# Patient Record
Sex: Female | Born: 1945 | Race: White | Hispanic: No | Smoking: Never smoker
Health system: Southern US, Community
[De-identification: ages and names within clinical notes are randomized; demographics above are authoritative.]

## PROBLEM LIST (undated history)

## (undated) DIAGNOSIS — Z860100 Personal history of colon polyps, unspecified: Secondary | ICD-10-CM

## (undated) DIAGNOSIS — F419 Anxiety disorder, unspecified: Secondary | ICD-10-CM

## (undated) DIAGNOSIS — F329 Major depressive disorder, single episode, unspecified: Secondary | ICD-10-CM

## (undated) DIAGNOSIS — D219 Benign neoplasm of connective and other soft tissue, unspecified: Secondary | ICD-10-CM

## (undated) DIAGNOSIS — K219 Gastro-esophageal reflux disease without esophagitis: Secondary | ICD-10-CM

## (undated) DIAGNOSIS — N390 Urinary tract infection, site not specified: Secondary | ICD-10-CM

## (undated) DIAGNOSIS — L989 Disorder of the skin and subcutaneous tissue, unspecified: Secondary | ICD-10-CM

## (undated) DIAGNOSIS — N6320 Unspecified lump in the left breast, unspecified quadrant: Secondary | ICD-10-CM

## (undated) DIAGNOSIS — C801 Malignant (primary) neoplasm, unspecified: Secondary | ICD-10-CM

## (undated) DIAGNOSIS — F32A Depression, unspecified: Secondary | ICD-10-CM

## (undated) DIAGNOSIS — M8580 Other specified disorders of bone density and structure, unspecified site: Secondary | ICD-10-CM

## (undated) DIAGNOSIS — F319 Bipolar disorder, unspecified: Secondary | ICD-10-CM

## (undated) DIAGNOSIS — E079 Disorder of thyroid, unspecified: Secondary | ICD-10-CM

## (undated) DIAGNOSIS — R410 Disorientation, unspecified: Secondary | ICD-10-CM

## (undated) DIAGNOSIS — G2581 Restless legs syndrome: Secondary | ICD-10-CM

## (undated) DIAGNOSIS — E039 Hypothyroidism, unspecified: Secondary | ICD-10-CM

## (undated) HISTORY — PX: THYROID SURGERY: SHX805

## (undated) HISTORY — DX: Personal history of colonic polyps: Z86.010

## (undated) HISTORY — DX: Malignant (primary) neoplasm, unspecified: C80.1

## (undated) HISTORY — PX: SKIN CANCER EXCISION: SHX779

## (undated) HISTORY — PX: DILATION AND CURETTAGE OF UTERUS: SHX78

## (undated) HISTORY — PX: BREAST SURGERY: SHX581

## (undated) HISTORY — PX: LIPOSUCTION TRUNK: SUR833

## (undated) HISTORY — DX: Disorder of the skin and subcutaneous tissue, unspecified: L98.9

## (undated) HISTORY — DX: Benign neoplasm of connective and other soft tissue, unspecified: D21.9

## (undated) HISTORY — DX: Other specified disorders of bone density and structure, unspecified site: M85.80

## (undated) HISTORY — PX: TONSILLECTOMY: SUR1361

## (undated) HISTORY — DX: Personal history of colon polyps, unspecified: Z86.0100

---

## 1998-09-03 ENCOUNTER — Ambulatory Visit (HOSPITAL_COMMUNITY): Admission: RE | Admit: 1998-09-03 | Discharge: 1998-09-03 | Payer: Self-pay | Admitting: Gastroenterology

## 1999-05-13 ENCOUNTER — Other Ambulatory Visit: Admission: RE | Admit: 1999-05-13 | Discharge: 1999-05-13 | Payer: Self-pay | Admitting: Obstetrics and Gynecology

## 2000-05-11 ENCOUNTER — Other Ambulatory Visit: Admission: RE | Admit: 2000-05-11 | Discharge: 2000-05-11 | Payer: Self-pay | Admitting: Obstetrics and Gynecology

## 2001-05-31 ENCOUNTER — Other Ambulatory Visit: Admission: RE | Admit: 2001-05-31 | Discharge: 2001-05-31 | Payer: Self-pay | Admitting: Obstetrics and Gynecology

## 2002-06-19 ENCOUNTER — Other Ambulatory Visit: Admission: RE | Admit: 2002-06-19 | Discharge: 2002-06-19 | Payer: Self-pay | Admitting: Obstetrics and Gynecology

## 2003-06-24 ENCOUNTER — Other Ambulatory Visit: Admission: RE | Admit: 2003-06-24 | Discharge: 2003-06-24 | Payer: Self-pay | Admitting: Obstetrics and Gynecology

## 2003-09-26 ENCOUNTER — Ambulatory Visit (HOSPITAL_COMMUNITY): Admission: RE | Admit: 2003-09-26 | Discharge: 2003-09-26 | Payer: Self-pay | Admitting: Gastroenterology

## 2003-09-26 ENCOUNTER — Encounter (INDEPENDENT_AMBULATORY_CARE_PROVIDER_SITE_OTHER): Payer: Self-pay | Admitting: Specialist

## 2004-01-11 HISTORY — PX: HYSTEROSCOPY WITH D & C: SHX1775

## 2004-06-18 ENCOUNTER — Ambulatory Visit (HOSPITAL_COMMUNITY): Admission: RE | Admit: 2004-06-18 | Discharge: 2004-06-18 | Payer: Self-pay | Admitting: Obstetrics and Gynecology

## 2004-06-18 ENCOUNTER — Encounter (INDEPENDENT_AMBULATORY_CARE_PROVIDER_SITE_OTHER): Payer: Self-pay | Admitting: Specialist

## 2004-10-05 ENCOUNTER — Other Ambulatory Visit: Admission: RE | Admit: 2004-10-05 | Discharge: 2004-10-05 | Payer: Self-pay | Admitting: Obstetrics and Gynecology

## 2005-10-05 ENCOUNTER — Other Ambulatory Visit: Admission: RE | Admit: 2005-10-05 | Discharge: 2005-10-05 | Payer: Self-pay | Admitting: Obstetrics and Gynecology

## 2006-10-13 ENCOUNTER — Other Ambulatory Visit: Admission: RE | Admit: 2006-10-13 | Discharge: 2006-10-13 | Payer: Self-pay | Admitting: Obstetrics and Gynecology

## 2007-10-17 ENCOUNTER — Other Ambulatory Visit: Admission: RE | Admit: 2007-10-17 | Discharge: 2007-10-17 | Payer: Self-pay | Admitting: Obstetrics and Gynecology

## 2009-03-24 ENCOUNTER — Emergency Department (HOSPITAL_COMMUNITY): Admission: EM | Admit: 2009-03-24 | Discharge: 2009-03-25 | Payer: Self-pay | Admitting: Emergency Medicine

## 2009-07-17 ENCOUNTER — Other Ambulatory Visit: Admission: RE | Admit: 2009-07-17 | Discharge: 2009-07-17 | Payer: Self-pay | Admitting: Radiology

## 2009-11-02 ENCOUNTER — Ambulatory Visit (HOSPITAL_COMMUNITY): Admission: RE | Admit: 2009-11-02 | Discharge: 2009-11-02 | Payer: Self-pay | Admitting: Internal Medicine

## 2009-11-10 ENCOUNTER — Ambulatory Visit (HOSPITAL_COMMUNITY): Admission: RE | Admit: 2009-11-10 | Discharge: 2009-11-10 | Payer: Self-pay | Admitting: Surgery

## 2009-12-28 ENCOUNTER — Encounter (INDEPENDENT_AMBULATORY_CARE_PROVIDER_SITE_OTHER): Payer: Self-pay | Admitting: Surgery

## 2009-12-28 ENCOUNTER — Ambulatory Visit (HOSPITAL_COMMUNITY)
Admission: RE | Admit: 2009-12-28 | Discharge: 2009-12-29 | Payer: Self-pay | Source: Home / Self Care | Attending: Surgery | Admitting: Surgery

## 2010-01-30 ENCOUNTER — Encounter: Payer: Self-pay | Admitting: Internal Medicine

## 2010-02-19 ENCOUNTER — Other Ambulatory Visit (HOSPITAL_COMMUNITY): Payer: Self-pay | Admitting: Surgery

## 2010-02-19 DIAGNOSIS — E214 Other specified disorders of parathyroid gland: Secondary | ICD-10-CM

## 2010-03-05 ENCOUNTER — Encounter (HOSPITAL_COMMUNITY): Admission: RE | Admit: 2010-03-05 | Payer: BC Managed Care – PPO | Source: Ambulatory Visit

## 2010-03-05 ENCOUNTER — Ambulatory Visit (HOSPITAL_COMMUNITY)
Admission: RE | Admit: 2010-03-05 | Discharge: 2010-03-05 | Disposition: A | Discharge status: Home / Self Care | Payer: BC Managed Care – PPO | Source: Ambulatory Visit | Attending: Surgery | Admitting: Surgery

## 2010-03-05 ENCOUNTER — Ambulatory Visit (HOSPITAL_COMMUNITY): Payer: Self-pay

## 2010-03-05 ENCOUNTER — Encounter (HOSPITAL_COMMUNITY): Payer: Self-pay

## 2010-03-05 DIAGNOSIS — E214 Other specified disorders of parathyroid gland: Secondary | ICD-10-CM

## 2010-03-05 DIAGNOSIS — E213 Hyperparathyroidism, unspecified: Secondary | ICD-10-CM | POA: Insufficient documentation

## 2010-03-05 MED ORDER — TECHNETIUM TC 99M SESTAMIBI - CARDIOLITE
25.0000 | Freq: Once | INTRAVENOUS | Status: AC | PRN
  Administered 2010-03-05: 25 via INTRAVENOUS

## 2010-03-08 ENCOUNTER — Encounter (HOSPITAL_COMMUNITY): Payer: Self-pay

## 2010-03-22 LAB — CALCIUM: Calcium: 10.2 mg/dL (ref 8.4–10.5)

## 2010-03-23 LAB — URINE MICROSCOPIC-ADD ON

## 2010-03-23 LAB — DIFFERENTIAL
Lymphocytes Relative: 18 % (ref 12–46)
Lymphs Abs: 1.4 10*3/uL (ref 0.7–4.0)
Monocytes Absolute: 0.6 10*3/uL (ref 0.1–1.0)
Monocytes Relative: 8 % (ref 3–12)
Neutro Abs: 5.6 10*3/uL (ref 1.7–7.7)
Neutrophils Relative %: 72 % (ref 43–77)

## 2010-03-23 LAB — CBC
HCT: 43.4 % (ref 36.0–46.0)
Hemoglobin: 13.8 g/dL (ref 12.0–15.0)
MCHC: 31.8 g/dL (ref 30.0–36.0)
RBC: 4.48 MIL/uL (ref 3.87–5.11)

## 2010-03-23 LAB — BASIC METABOLIC PANEL
Calcium: 10.7 mg/dL — ABNORMAL HIGH (ref 8.4–10.5)
GFR calc Af Amer: >60 mL/min (ref >60)
GFR calc non Af Amer: >60 mL/min (ref >60)
Glucose, Bld: 83 mg/dL (ref 70–99)
Potassium: 4.2 mEq/L (ref 3.5–5.1)
Sodium: 141 mEq/L (ref 135–145)

## 2010-03-23 LAB — URINALYSIS, ROUTINE W REFLEX MICROSCOPIC
Bilirubin Urine: NEGATIVE
Glucose, UA: NEGATIVE mg/dL
Hgb urine dipstick: NEGATIVE
Protein, ur: NEGATIVE mg/dL
Urobilinogen, UA: 0.2 mg/dL (ref 0.0–1.0)

## 2010-03-23 LAB — PROTIME-INR: INR: 0.93 (ref 0.00–1.49)

## 2010-03-23 LAB — SURGICAL PCR SCREEN: Staphylococcus aureus: POSITIVE — AB

## 2010-05-28 NOTE — Op Note (Signed)
NAME:  Amy [name], Amy [name]            ACCOUNT NO.:  [id number]   MEDICAL RECORD NO.:  [id number]          PATIENT TYPE:  AMB   LOCATION:  SDC                           FACILITY:  [facility]   PHYSICIAN:  Cynthia P. Romine, M.D.DATE OF BIRTH:  25-Jan-1945   DATE OF PROCEDURE:  06/18/2004  DATE OF DISCHARGE:                                 OPERATIVE REPORT   PREOPERATIVE DIAGNOSES:  1.  Postmenopausal bleeding.  2.  Known endometrial polyp.   POSTOPERATIVE DIAGNOSES:  1.  Postmenopausal bleeding.  2.  Known endometrial polyp.  3.  Pathology pending.   PROCEDURE:  Hysteroscopic resection of endometrial polyp, dilatation and  curettage.   SURGEON:  Cynthia P. Romine, M.D.   ANESTHESIA:  General by LMA.   ESTIMATED BLOOD LOSS:  Minimal.   COMPLICATIONS:  None.   SORBITOL DEFICIT:  Zero.   PROCEDURE:  The patient was taken to the operating room and after the  induction of adequate general anesthesia by LMA, was placed in the dorsal  lithotomy position and prepped and draped in the usual fashion.  The bladder  was drained with a red rubber catheter.  A posterior weighted and anterior  Sims retractor were placed.  The cervix was grasped on its anterior lip with  a single-tooth tenaculum.  The cervix was dilated to a #31 Pratt.  The  operative hysteroscope was introduced and the endometrial cavity explored.  Photographic documentation was taken of the 1.8 cm fundal endometrial polyp  that had been seen previously on sonohysterogram.  The endometrial polyp was  resected using a single loop.  The sorbitol pump was set at a pressure of 80  mmHg.  The polyp was large enough that it needed to be resected in several  pieces.  After removing the polyp with the resectoscope, the resectoscope  was removed, sharp curettage was carried out, and the specimen was sent to  pathology.  The resectoscope was then reintroduced.  Photographic  documentation was taken of the clean cavity after the resection,  and the  procedure was terminated.  The instruments were removed from the vagina and  the patient was taken to the recovery room in satisfactory condition.     CPR/MEDQ  D:  06/18/2004  T:  06/18/2004  Job:  [id number]

## 2010-05-28 NOTE — Op Note (Signed)
NAME:  Amy [name], Amy [name]                      ACCOUNT NO.:  [id number]   MEDICAL RECORD NO.:  [id number]                   PATIENT TYPE:  AMB   LOCATION:  ENDO                                 FACILITY:  [facility]   PHYSICIAN:  James L. Malon Kindle., M.D.          DATE OF BIRTH:  09/18/1945   DATE OF PROCEDURE:  09/26/2003  DATE OF DISCHARGE:                                 OPERATIVE REPORT   PROCEDURE:  Colonoscopy and polypectomy.   MEDICATIONS:  Fentanyl 100 mcg, Versed 10 mg IV.   INDICATIONS FOR PROCEDURE:  Strong family history of colon cancer, father  has had colon cancer. This is done as a five year followup procedure.   DESCRIPTION OF PROCEDURE:  The procedure had been explained to the patient  and consent obtained. With the patient in the left lateral decubitus  position, the Olympus pediatric adjustable scope was inserted and advanced.  We were able to advance to the cecum easily, the ileocecal valve and  appendiceal orifice were seen.  The scope was withdrawn and the cecum,  ascending colon, hepatic flexure crossing well and the mid descending colon  were seen well.  In the descending colon, a 1 cm polyp was seen  approximately 50 cm and was removed with the snare and sucked against the  scope and recovered.  We recovered the polyp and then went back up and  reinspected the site, there was no bleeding. The remainder of the descending  colon, sigmoid and rectum were seen well upon removal of the scope and were  free of any other lesions or polyps.  The scope was withdrawn and the  patient tolerated the procedure well.   ASSESSMENT:  Descending colon polyp removed, 211.3.   PLAN:  Routine post polypectomy instructions.  Will recommend repeating  procedure in three years.                                               James L. Malon Kindle., M.D.    Waldron Session  D:  09/26/2003  T:  09/26/2003  Job:  [id number]   cc:   Geoffry Paradise, M.D.  [address]  [place]  [place]  [address]  Fax: [phone]

## 2011-01-18 DIAGNOSIS — F3174 Bipolar disorder, in full remission, most recent episode manic: Secondary | ICD-10-CM | POA: Diagnosis not present

## 2011-01-18 DIAGNOSIS — F3189 Other bipolar disorder: Secondary | ICD-10-CM | POA: Diagnosis not present

## 2011-02-10 DIAGNOSIS — N390 Urinary tract infection, site not specified: Secondary | ICD-10-CM | POA: Diagnosis not present

## 2011-02-15 DIAGNOSIS — F3189 Other bipolar disorder: Secondary | ICD-10-CM | POA: Diagnosis not present

## 2011-02-15 DIAGNOSIS — F3174 Bipolar disorder, in full remission, most recent episode manic: Secondary | ICD-10-CM | POA: Diagnosis not present

## 2011-02-21 DIAGNOSIS — R809 Proteinuria, unspecified: Secondary | ICD-10-CM | POA: Diagnosis not present

## 2011-02-21 DIAGNOSIS — N390 Urinary tract infection, site not specified: Secondary | ICD-10-CM | POA: Diagnosis not present

## 2011-02-28 DIAGNOSIS — J3801 Paralysis of vocal cords and larynx, unilateral: Secondary | ICD-10-CM | POA: Diagnosis not present

## 2011-03-10 DIAGNOSIS — N390 Urinary tract infection, site not specified: Secondary | ICD-10-CM | POA: Diagnosis not present

## 2011-03-22 DIAGNOSIS — F3174 Bipolar disorder, in full remission, most recent episode manic: Secondary | ICD-10-CM | POA: Diagnosis not present

## 2011-03-23 DIAGNOSIS — F3189 Other bipolar disorder: Secondary | ICD-10-CM | POA: Diagnosis not present

## 2011-04-05 DIAGNOSIS — N390 Urinary tract infection, site not specified: Secondary | ICD-10-CM | POA: Diagnosis not present

## 2011-04-15 DIAGNOSIS — N390 Urinary tract infection, site not specified: Secondary | ICD-10-CM | POA: Diagnosis not present

## 2011-04-29 DIAGNOSIS — F3189 Other bipolar disorder: Secondary | ICD-10-CM | POA: Diagnosis not present

## 2011-04-29 DIAGNOSIS — N390 Urinary tract infection, site not specified: Secondary | ICD-10-CM | POA: Diagnosis not present

## 2011-04-29 DIAGNOSIS — F3174 Bipolar disorder, in full remission, most recent episode manic: Secondary | ICD-10-CM | POA: Diagnosis not present

## 2011-05-06 DIAGNOSIS — F3174 Bipolar disorder, in full remission, most recent episode manic: Secondary | ICD-10-CM | POA: Diagnosis not present

## 2011-05-09 DIAGNOSIS — E039 Hypothyroidism, unspecified: Secondary | ICD-10-CM | POA: Diagnosis not present

## 2011-05-09 DIAGNOSIS — F319 Bipolar disorder, unspecified: Secondary | ICD-10-CM | POA: Diagnosis not present

## 2011-05-09 DIAGNOSIS — J309 Allergic rhinitis, unspecified: Secondary | ICD-10-CM | POA: Diagnosis not present

## 2011-05-20 DIAGNOSIS — N3020 Other chronic cystitis without hematuria: Secondary | ICD-10-CM | POA: Diagnosis not present

## 2011-05-20 DIAGNOSIS — R351 Nocturia: Secondary | ICD-10-CM | POA: Diagnosis not present

## 2011-06-01 ENCOUNTER — Emergency Department (HOSPITAL_COMMUNITY)
Admission: EM | Admit: 2011-06-01 | Discharge: 2011-06-01 | Disposition: A | Discharge status: Home / Self Care | Payer: Medicare Other | Attending: Emergency Medicine | Admitting: Emergency Medicine

## 2011-06-01 ENCOUNTER — Emergency Department (HOSPITAL_COMMUNITY): Payer: Medicare Other

## 2011-06-01 ENCOUNTER — Encounter (HOSPITAL_COMMUNITY): Payer: Self-pay | Admitting: *Deleted

## 2011-06-01 DIAGNOSIS — H8110 Benign paroxysmal vertigo, unspecified ear: Secondary | ICD-10-CM | POA: Diagnosis not present

## 2011-06-01 DIAGNOSIS — R42 Dizziness and giddiness: Secondary | ICD-10-CM | POA: Diagnosis not present

## 2011-06-01 DIAGNOSIS — F29 Unspecified psychosis not due to a substance or known physiological condition: Secondary | ICD-10-CM | POA: Diagnosis not present

## 2011-06-01 DIAGNOSIS — R51 Headache: Secondary | ICD-10-CM | POA: Diagnosis not present

## 2011-06-01 DIAGNOSIS — R110 Nausea: Secondary | ICD-10-CM | POA: Diagnosis not present

## 2011-06-01 DIAGNOSIS — R4182 Altered mental status, unspecified: Secondary | ICD-10-CM | POA: Insufficient documentation

## 2011-06-01 DIAGNOSIS — R209 Unspecified disturbances of skin sensation: Secondary | ICD-10-CM | POA: Diagnosis not present

## 2011-06-01 DIAGNOSIS — Z79899 Other long term (current) drug therapy: Secondary | ICD-10-CM | POA: Insufficient documentation

## 2011-06-01 DIAGNOSIS — F3130 Bipolar disorder, current episode depressed, mild or moderate severity, unspecified: Secondary | ICD-10-CM | POA: Diagnosis not present

## 2011-06-01 DIAGNOSIS — F411 Generalized anxiety disorder: Secondary | ICD-10-CM | POA: Insufficient documentation

## 2011-06-01 DIAGNOSIS — F4489 Other dissociative and conversion disorders: Secondary | ICD-10-CM | POA: Diagnosis not present

## 2011-06-01 DIAGNOSIS — R6889 Other general symptoms and signs: Secondary | ICD-10-CM | POA: Diagnosis not present

## 2011-06-01 DIAGNOSIS — F309 Manic episode, unspecified: Secondary | ICD-10-CM | POA: Diagnosis not present

## 2011-06-01 DIAGNOSIS — R29810 Facial weakness: Secondary | ICD-10-CM | POA: Diagnosis not present

## 2011-06-01 DIAGNOSIS — F419 Anxiety disorder, unspecified: Secondary | ICD-10-CM

## 2011-06-01 HISTORY — DX: Urinary tract infection, site not specified: N39.0

## 2011-06-01 HISTORY — DX: Bipolar disorder, unspecified: F31.9

## 2011-06-01 HISTORY — DX: Anxiety disorder, unspecified: F41.9

## 2011-06-01 LAB — CBC
HCT: 46.7 % — ABNORMAL HIGH (ref 36.0–46.0)
Hemoglobin: 15 g/dL (ref 12.0–15.0)
MCV: 96.1 fL (ref 78.0–100.0)
RBC: 4.86 MIL/uL (ref 3.87–5.11)
RDW: 13 % (ref 11.5–15.5)
WBC: 7.5 10*3/uL (ref 4.0–10.5)

## 2011-06-01 LAB — DIFFERENTIAL
Basophils Absolute: 0 10*3/uL (ref 0.0–0.1)
Basophils Relative: 0 % (ref 0–1)
Eosinophils Relative: 2 % (ref 0–5)
Lymphocytes Relative: 16 % (ref 12–46)
Lymphs Abs: 1.2 10*3/uL (ref 0.7–4.0)
Monocytes Relative: 7 % (ref 3–12)
Neutro Abs: 5.6 10*3/uL (ref 1.7–7.7)

## 2011-06-01 LAB — CARDIAC PANEL(CRET KIN+CKTOT+MB+TROPI)
Relative Index: INVALID (ref 0.0–2.5)
Troponin I: <0.3 ng/mL (ref <0.30)

## 2011-06-01 LAB — BASIC METABOLIC PANEL
BUN: 8 mg/dL (ref 6–23)
CO2: 26 mEq/L (ref 19–32)
Chloride: 103 mEq/L (ref 96–112)
GFR calc Af Amer: >90 mL/min (ref >90)
Potassium: 4.1 mEq/L (ref 3.5–5.1)

## 2011-06-01 LAB — URINALYSIS, ROUTINE W REFLEX MICROSCOPIC
Bilirubin Urine: NEGATIVE
Glucose, UA: NEGATIVE mg/dL
Hgb urine dipstick: NEGATIVE
Ketones, ur: NEGATIVE mg/dL
Protein, ur: NEGATIVE mg/dL

## 2011-06-01 LAB — URINE MICROSCOPIC-ADD ON

## 2011-06-01 MED ORDER — SODIUM CHLORIDE 0.9 % IV BOLUS (SEPSIS)
500.0000 mL | Freq: Once | INTRAVENOUS | Status: AC
  Administered 2011-06-01: 500 mL via INTRAVENOUS

## 2011-06-01 NOTE — ED Provider Notes (Signed)
History     CSN: [id number]  Arrival date & time 06/01/11  1526   None     Chief Complaint  Patient presents with  . Dizziness  . Altered Mental Status    (Consider location/radiation/quality/duration/timing/severity/associated sxs/prior treatment) Patient is a 66 y.o. female presenting with altered mental status. The history is provided by the patient, the spouse and a relative. No language interpreter was used.  Altered Mental Status This is a recurrent problem. The current episode started in the past 7 days. The problem occurs daily. The problem has been resolved. Associated symptoms include headaches and nausea. Pertinent negatives include no chest pain, diaphoresis, fever, neck pain, numbness, sore throat, swollen glands, vomiting or weakness.   66 year old female coming in today with dizziness and altered mental status per daughter. Daughter states that earlier today she had a asymmetrical smile and was concerned she was having a stroke. Throughout the week she's had various doctor's appointments and she has gotten them confused and gone on the wrong days. She's had frequent UTIs lately. She's also seeing a psychiatrist which changed her from lithium to Lamictal for her depression and bipolar disease wondering if that has something to do with her confusion. Neuro is presently is intact she's denying dizziness or headache presently. She has had parathyroid disease and surgery at [facility] in the past. She's also had a procedure at [facility] to have her "vocal cords cut".   Past Medical History  Diagnosis Date  . Hyperparathyroidism   . UTI (lower urinary tract infection)   . Anxiety   . Bipolar 1 disorder     History reviewed. No pertinent past surgical history.  History reviewed. No pertinent family history.  History  Substance Use Topics  . Smoking status: Never Smoker   . Smokeless tobacco: Not on file  . Alcohol Use: No    OB History    Grav Para Term Preterm Abortions  TAB SAB Ect Mult Living                  Review of Systems  Constitutional: Negative.  Negative for fever and diaphoresis.  HENT: Positive for ear pain and sinus pressure. Negative for hearing loss, sore throat, rhinorrhea, trouble swallowing, neck pain, voice change, postnasal drip and ear discharge.   Eyes: Negative.   Respiratory: Negative.  Negative for apnea and shortness of breath.   Cardiovascular: Negative.  Negative for chest pain.  Gastrointestinal: Positive for nausea. Negative for vomiting.  Genitourinary: Negative for dysuria, urgency, flank pain and difficulty urinating.  Neurological: Positive for dizziness, facial asymmetry and headaches. Negative for syncope, speech difficulty, weakness and numbness.  Psychiatric/Behavioral: Positive for confusion and altered mental status.  All other systems reviewed and are negative.    Allergies  Review of patient's allergies indicates no known allergies.  Home Medications   Current Outpatient Rx  Name Route Sig Dispense Refill  . LAMOTRIGINE 150 MG PO TABS Oral Take 150 mg by mouth daily.    [name] LEVOTHYROXINE SODIUM 75 MCG PO TABS Oral Take 75 mcg by mouth daily.    [name] LITHIUM CARBONATE 150 MG PO CAPS Oral Take 150 mg by mouth 3 (three) times daily with meals.    [name] LORATADINE 10 MG PO TABS Oral Take 10 mg by mouth daily.    [name] LORAZEPAM 0.5 MG PO TABS Oral Take 0.5 mg by mouth every 8 (eight) hours as needed. For anxiety      BP 181/72  Pulse 80  Temp(Src)  98.1 F (36.7 C) (Oral)  Resp 20  SpO2 98%  Physical Exam  Nursing note and vitals reviewed. Constitutional: She is oriented to person, place, and time. She appears well-developed and well-nourished.  HENT:  Head: Normocephalic and atraumatic.  Eyes: Conjunctivae and EOM are normal. Pupils are equal, round, and reactive to light.  Neck: Normal range of motion. Neck supple.  Cardiovascular: Normal rate, regular rhythm, normal heart sounds and intact distal pulses.   Exam reveals no friction rub.   No murmur heard. Pulmonary/Chest: Effort normal and breath sounds normal. No respiratory distress.  Abdominal: Soft. Bowel sounds are normal. She exhibits no distension. There is no tenderness.  Musculoskeletal: Normal range of motion. She exhibits no edema and no tenderness.  Neurological: She is alert and oriented to person, place, and time. She has normal strength and normal reflexes. No cranial nerve deficit or sensory deficit. She displays a negative Romberg sign. GCS eye subscore is 4. GCS verbal subscore is 5. GCS motor subscore is 6.  Skin: Skin is warm and dry.  Psychiatric: She has a normal mood and affect.    ED Course  Procedures (including critical care time) 1900 Dr Thad Ranger to consult on patient.   2045  Dr. Roseanne Reno at bedside.  Not concerned about the CT results.  Recommends lithium level prior to discharge.  950.  Family very frustrated and ready to leave.  They have been her 6 hours and are tired.  I will call results of the lithium level to them.     Labs Reviewed  CBC  DIFFERENTIAL  BASIC METABOLIC PANEL  CARDIAC PANEL(CRET KIN+CKTOT+MB+TROPI)  URINALYSIS, ROUTINE W REFLEX MICROSCOPIC   No results found.   No diagnosis found.    MDM  Intermittant confusion and ? Facial droop per daughter.  CT head evaluated by Dr. Roseanne Reno neurologist who is not concerned for stroke.  Lithium level pending.  Family ready to go home.  Other labs unremarkable.  Few WBC with bacteria in urine.  Will culture.  Patient has many doctors appointments for herself and her husband (alzheimers pt)  almost average 3-4 times a week. Got appointment confused which concerned daughter who thought she saw a facial droop as well.  Will follow up with pcp this week.   12:07 PM 04/02/11  Lithium level called to GG patients daughter.  Level is .51 low.  She will call psychiatrist with this information.   Date: 06/02/2011  Rate: 71  Rhythm: normal sinus rhythm  QRS  Axis: normal  Intervals: normal  ST/T Wave abnormalities: normal  Conduction Disutrbances:none  Narrative Interpretation: L v hypertrophy  Old EKG Reviewed: none available  Labs Reviewed  CBC - Abnormal; Notable for the following:    HCT 46.7 (*)    All other components within normal limits  BASIC METABOLIC PANEL - Abnormal; Notable for the following:    Calcium 11.0 (*)    All other components within normal limits  URINALYSIS, ROUTINE W REFLEX MICROSCOPIC - Abnormal; Notable for the following:    Leukocytes, UA SMALL (*)    All other components within normal limits  URINE MICROSCOPIC-ADD ON - Abnormal; Notable for the following:    Squamous Epithelial / LPF FEW (*)    Bacteria, UA MANY (*)    All other components within normal limits  LITHIUM LEVEL - Abnormal; Notable for the following:    Lithium Lvl 0.51 (*)    All other components within normal limits  DIFFERENTIAL  CARDIAC PANEL(CRET KIN+CKTOT+MB+TROPI)  URINE CULTURE           Remi Haggard, NP 06/02/11 1210

## 2011-06-01 NOTE — ED Notes (Signed)
Pt. Given Happy Meal per instructions by NP.

## 2011-06-01 NOTE — ED Notes (Signed)
On arrival to department pt stating she needed to use restroom, patient helped by RN to bedside commode, pt denies any dizziness while using the bedside commode, pt placed in gown and vitals have been obtained, NT completing EKG.

## 2011-06-01 NOTE — Consult Note (Signed)
[facility] CONSULT NOTE     Reason for Consult: Altered mental status with confusion and dizziness    HPI:    Amy [name] is an 66 y.o. female a history of bipolar disorder, anxiety, recurrent urinary tract infections and hyperparathyroidism, presenting with history of an episode this afternoon consisting of difficulty with being able to understand her calendar as well as a feeling of dizziness and slight nausea. Her daughter thinks the face may have been slightly asymmetrical, but not dramatically so. She had no changes in speech. Patient was driving at the onset and continued to drive for about 15 miles with no problems with knowing where she was when she was going nor any problems with probably operating the vehicle. 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 There was no acute abnormality. Laboratory studies were unremarkable except for positive urine for bacteria and leukocytes. Lithium level is pending. Patient's mental status has returned to baseline at this point. Family indicates an increase in anxiety and irritability of the past couple of days. Patient has had no changes in medication over the past 7-10 days.  Past Medical History  Diagnosis Date  . Hyperparathyroidism   . UTI (lower urinary tract infection)   . Anxiety   . Bipolar 1 disorder     History reviewed. No pertinent past surgical history.  History reviewed. No pertinent family history.  Social History:  reports that she has never smoked. She does not have any smokeless tobacco history on file. She reports that she does not drink alcohol. Her drug history not on file.  No Known Allergies  Medications:    Prior to Admission:  Lamictal 150 mg per day Synthroid 75 mcg per day Lithium carbonate 150 mg 3 times a day Claritin 10 mg per day Ativan 0.5 mg every 8 hours  when necessary anxiety  Blood pressure 158/60, pulse 72, temperature 98 F (36.7 C), temperature source Oral, resp. rate 20, SpO2 96.00%.   Neurologic Examination:  Mental Status: Alert, oriented, thought content appropriate.  Speech fluent without evidence of aphasia. Able to follow commands without difficulty. Cranial Nerves: II-Visual fields were normal. III/IV/VI-Pupils were equal and reacted. Extraocular movements were full and conjugate.    V/VII-no facial numbness and no facial weakness. VIII-normal. X-normal speech and symmetrical palatal movement. XII-midline tongue extension Motor: 5/5 bilaterally with normal tone and bulk Sensory: Normal throughout. Deep Tendon Reflexes: 2+ and symmetric. Plantars: Flexor bilaterally Cerebellar: Normal finger-to-nose testing. Carotid auscultation: Normal    No results found for this basename: cbc, bmp, coags, chol, tri, ldl, hga1c    Results for orders placed during the hospital encounter of 06/01/11 (from the past 48 hour(s))  CBC     Status: Abnormal   Collection Time   06/01/11  4:00 PM      Component Value Range Comment   WBC 7.5  4.0 - 10.5 (K/uL) WHITE COUNT CONFIRMED ON SMEAR   RBC 4.86  3.87 - 5.11 (MIL/uL)    Hemoglobin 15.0  12.0 - 15.0 (g/dL)    HCT 16.1 (*) 09.6 - 46.0 (%)    MCV 96.1  78.0 - 100.0 (fL)    MCH 30.9  26.0 - 34.0 (pg)    MCHC 32.1  30.0 - 36.0 (g/dL)    RDW 13.0  11.5 - 15.5 (%)    Platelets PLATELETS APPEAR ADEQUATE  150 - 400 (K/uL)   DIFFERENTIAL     Status: Normal   Collection Time   06/01/11  4:00 PM      Component Value Range Comment   Neutrophils Relative 75  43 - 77 (%)    Lymphocytes Relative 16  12 - 46 (%)    Monocytes Relative 7  3 - 12 (%)    Eosinophils Relative 2  0 - 5 (%)    Basophils Relative 0  0 - 1 (%)    Neutro Abs 5.6  1.7 - 7.7 (K/uL)    Lymphs Abs 1.2  0.7 - 4.0 (K/uL)    Monocytes Absolute 0.5  0.1 - 1.0 (K/uL)    Eosinophils Absolute 0.2  0.0 - 0.7 (K/uL)    Basophils  Absolute 0.0  0.0 - 0.1 (K/uL)    Smear Review        Value: PLATELET CLUMPS NOTED ON SMEAR, COUNT APPEARS ADEQUATE  BASIC METABOLIC PANEL     Status: Abnormal   Collection Time   06/01/11  4:00 PM      Component Value Range Comment   Sodium 139  135 - 145 (mEq/L)    Potassium 4.1  3.5 - 5.1 (mEq/L)    Chloride 103  96 - 112 (mEq/L)    CO2 26  19 - 32 (mEq/L)    Glucose, Bld 97  70 - 99 (mg/dL)    BUN 8  6 - 23 (mg/dL)    Creatinine, Ser 4.09  0.50 - 1.10 (mg/dL)    Calcium 81.1 (*) 8.4 - 10.5 (mg/dL)    GFR calc non Af Amer >90  >90 (mL/min)    GFR calc Af Amer >90  >90 (mL/min)   CARDIAC PANEL(CRET KIN+CKTOT+MB+TROPI)     Status: Normal   Collection Time   06/01/11  4:00 PM      Component Value Range Comment   Total CK 30  7 - 177 (U/L)    CK, MB 1.5  0.3 - 4.0 (ng/mL)    Troponin I <0.30  <0.30 (ng/mL)    Relative Index RELATIVE INDEX IS INVALID  0.0 - 2.5    URINALYSIS, ROUTINE W REFLEX MICROSCOPIC     Status: Abnormal   Collection Time   06/01/11  4:16 PM      Component Value Range Comment   Color, Urine YELLOW  YELLOW     APPearance CLEAR  CLEAR     Specific Gravity, Urine 1.007  1.005 - 1.030     pH 7.0  5.0 - 8.0     Glucose, UA NEGATIVE  NEGATIVE (mg/dL)    Hgb urine dipstick NEGATIVE  NEGATIVE     Bilirubin Urine NEGATIVE  NEGATIVE     Ketones, ur NEGATIVE  NEGATIVE (mg/dL)    Protein, ur NEGATIVE  NEGATIVE (mg/dL)    Urobilinogen, UA 0.2  0.0 - 1.0 (mg/dL)    Nitrite NEGATIVE  NEGATIVE     Leukocytes, UA SMALL (*) NEGATIVE    URINE MICROSCOPIC-ADD ON     Status: Abnormal   Collection Time   06/01/11  4:16 PM      Component Value Range Comment   Squamous Epithelial / LPF FEW (*) RARE     WBC, UA 3-6  <3 (WBC/hpf)    Bacteria, UA MANY (*) RARE      Ct Head Wo Contrast  06/01/2011  *RADIOLOGY REPORT*  Clinical Data:  Dizziness and confusion.  Altered mental status and facial droop.  CT HEAD WITHOUT CONTRAST  Technique:  Contiguous axial images were obtained from  the base of the skull through the vertex without contrast.  Comparison: None.  Findings: The brain shows mild generalized atrophy.  There is a focal low density in the right basal ganglia/anterior limb internal capsule consistent with an infarction, age indeterminate.  This could be old.  No evidence of hemorrhage, mass lesion, hydrocephalus or extra-axial collection.  The calvarium is unremarkable.  There are chronic inflammatory changes of the right division of the sphenoid sinus.  IMPRESSION: No definite acute infarction.  Small area of low density in the right basal ganglia/anterior limb internal capsule consistent with infarction of indeterminate age, possibly old.  Original Report Authenticated By: Thomasenia Sales, M.D.     Assessment/Plan:  Etiology for presenting symptoms of transient feeling of confusion and dizziness with mild nausea is unclear. Description of her symptoms is not consistent with acute TIA or stroke, as symptoms are not attributable to a specific cerebrovascular territory. Acute anxiety reaction is more likely. No clinical significance of CT findings at this point since there appeared to be chronic and unrelated clinically with patient's presenting symptoms.  Recommendations:  1. Agree with obtaining lithium level to rule out possible mild toxicity. 2. No change in current medications if lithium level is within normal range. 3. Would add aspirin 81 mg per day for stroke prevention. 4. Routine followup with psychiatrist if lithium level is within normal range; sooner if low or high. 5. Routine followup with primary care physician.  Venetia Maxon M.D. [facility] [phone]  06/01/2011, 8:13 PM

## 2011-06-01 NOTE — ED Notes (Signed)
EMS-EMS was called out due to altered mental status and left sided facial droop. Pt had taken husband to doctors office for appt, appt is not till tomorrow. Pt complaining of vertigo. On arrival to scene facial droop had resolved. 20g(R)AC> CBG 79.

## 2011-06-01 NOTE — ED Notes (Signed)
Please refer all questions to daughter Donnie Coffin @[phone]

## 2011-06-01 NOTE — ED Notes (Signed)
Patient given discharge paperwork; went over discharge instructions with patient.  Patient instructed to follow up with their primary care physician this week and to return to the ED for new, worsening, or concerning symptoms.

## 2011-06-01 NOTE — Discharge Instructions (Signed)
Ms Hatfield the CT of your head today did not show any stroke. All the lab work was normal today. Your EKG did not show any changes or any kind of heart problems. The neurologist who saw you did not think this was a TIA either. The lithium level was     ?       [name] You seem to have a lot to keep up with taking care of your husband with Alzheimer's with many many appointments  That could easily get confusing. Follow up with your PCP this week. I will keep an eye out and call the level to you tonight.    Anxiety and Panic Attacks Anxiety is your body's way of reacting to real danger or something you think is a danger. It may be fear or worry over a situation like losing your job. Sometimes the cause is not known. A panic attack is made up of physical signs like sweating, shaking, or chest pain. Anxiety and panic attacks may start suddenly. They may be strong. They may come at any time of day, even while sleeping. They may come at any time of life. Panic attacks are scary, but they do not harm you physically.  HOME CARE  Avoid any known causes of your anxiety.   Try to relax. Yoga may help. Tell yourself everything will be okay.   Exercise often.   Get expert advice and help (therapy) to stop anxiety or attacks from happening.   Avoid caffeine, alcohol, and drugs.   Only take medicine as told by your doctor.  GET HELP RIGHT AWAY IF:  Your attacks seem different than normal attacks.   Your problems are getting worse or concern you.  MAKE SURE YOU:  Understand these instructions.   Will watch your condition.   Will get help right away if you are not doing well or get worse.  Document Released: 01/29/2010 Document Revised: 12/16/2010 Document Reviewed: 01/29/2010 [facility] Patient Information 2012 [place], [place].

## 2011-06-01 NOTE — ED Notes (Signed)
Pt is alert and oriented x 3 at this time. Pt reports dizziness at doctors office. States she looked at the calendar wrong, she thought her husbands appt was today not tomorrow. Is able to recall to me what she ate for breakfast. Pt denies any headache, chest pain, abdominal pain, fever, or sob. Pt states I took a baby ASA yesterday but I can't not remember why I took it. Stroke scale is negative.

## 2011-06-02 DIAGNOSIS — R404 Transient alteration of awareness: Secondary | ICD-10-CM

## 2011-06-03 NOTE — ED Provider Notes (Signed)
Medical screening examination/treatment/procedure(s) were conducted as a shared visit with non-physician practitioner(s) and myself.  I personally evaluated the patient during the encounter   Loren Racer, MD 06/03/11 [phone]

## 2011-06-04 LAB — URINE CULTURE
Colony Count: >=100000
Culture  Setup Time: 201305230459

## 2011-06-05 NOTE — ED Notes (Signed)
+   Urine Chart sent to EDP office for review. 

## 2011-06-06 NOTE — ED Notes (Signed)
Probably a skin contaminant. Per Grant Fontana.

## 2011-06-13 DIAGNOSIS — F3174 Bipolar disorder, in full remission, most recent episode manic: Secondary | ICD-10-CM | POA: Diagnosis not present

## 2011-06-13 DIAGNOSIS — F3189 Other bipolar disorder: Secondary | ICD-10-CM | POA: Diagnosis not present

## 2011-07-01 DIAGNOSIS — F411 Generalized anxiety disorder: Secondary | ICD-10-CM | POA: Diagnosis not present

## 2011-07-01 DIAGNOSIS — I10 Essential (primary) hypertension: Secondary | ICD-10-CM | POA: Diagnosis not present

## 2011-07-01 DIAGNOSIS — E039 Hypothyroidism, unspecified: Secondary | ICD-10-CM | POA: Diagnosis not present

## 2011-07-01 DIAGNOSIS — F319 Bipolar disorder, unspecified: Secondary | ICD-10-CM | POA: Diagnosis not present

## 2011-07-13 DIAGNOSIS — N3020 Other chronic cystitis without hematuria: Secondary | ICD-10-CM | POA: Diagnosis not present

## 2011-07-13 DIAGNOSIS — R350 Frequency of micturition: Secondary | ICD-10-CM | POA: Diagnosis not present

## 2011-07-22 DIAGNOSIS — F3174 Bipolar disorder, in full remission, most recent episode manic: Secondary | ICD-10-CM | POA: Diagnosis not present

## 2011-07-26 DIAGNOSIS — Z09 Encounter for follow-up examination after completed treatment for conditions other than malignant neoplasm: Secondary | ICD-10-CM | POA: Diagnosis not present

## 2011-07-26 DIAGNOSIS — N6019 Diffuse cystic mastopathy of unspecified breast: Secondary | ICD-10-CM | POA: Diagnosis not present

## 2011-08-03 DIAGNOSIS — F3189 Other bipolar disorder: Secondary | ICD-10-CM | POA: Diagnosis not present

## 2011-08-03 DIAGNOSIS — F3174 Bipolar disorder, in full remission, most recent episode manic: Secondary | ICD-10-CM | POA: Diagnosis not present

## 2011-08-16 DIAGNOSIS — M899 Disorder of bone, unspecified: Secondary | ICD-10-CM | POA: Diagnosis not present

## 2011-08-16 DIAGNOSIS — M79609 Pain in unspecified limb: Secondary | ICD-10-CM | POA: Diagnosis not present

## 2011-08-16 DIAGNOSIS — M722 Plantar fascial fibromatosis: Secondary | ICD-10-CM | POA: Diagnosis not present

## 2011-08-16 DIAGNOSIS — M659 Synovitis and tenosynovitis, unspecified: Secondary | ICD-10-CM | POA: Diagnosis not present

## 2011-08-16 DIAGNOSIS — IMO0002 Reserved for concepts with insufficient information to code with codable children: Secondary | ICD-10-CM | POA: Diagnosis not present

## 2011-08-23 DIAGNOSIS — M722 Plantar fascial fibromatosis: Secondary | ICD-10-CM | POA: Diagnosis not present

## 2011-08-23 DIAGNOSIS — M7150 Other bursitis, not elsewhere classified, unspecified site: Secondary | ICD-10-CM | POA: Diagnosis not present

## 2011-08-30 DIAGNOSIS — F3174 Bipolar disorder, in full remission, most recent episode manic: Secondary | ICD-10-CM | POA: Diagnosis not present

## 2011-09-09 DIAGNOSIS — F319 Bipolar disorder, unspecified: Secondary | ICD-10-CM | POA: Diagnosis not present

## 2011-09-09 DIAGNOSIS — R05 Cough: Secondary | ICD-10-CM | POA: Diagnosis not present

## 2011-09-09 DIAGNOSIS — F411 Generalized anxiety disorder: Secondary | ICD-10-CM | POA: Diagnosis not present

## 2011-09-09 DIAGNOSIS — R059 Cough, unspecified: Secondary | ICD-10-CM | POA: Diagnosis not present

## 2011-09-09 DIAGNOSIS — J309 Allergic rhinitis, unspecified: Secondary | ICD-10-CM | POA: Diagnosis not present

## 2011-09-16 DIAGNOSIS — F3189 Other bipolar disorder: Secondary | ICD-10-CM | POA: Diagnosis not present

## 2011-09-16 DIAGNOSIS — F3174 Bipolar disorder, in full remission, most recent episode manic: Secondary | ICD-10-CM | POA: Diagnosis not present

## 2011-09-17 DIAGNOSIS — Z23 Encounter for immunization: Secondary | ICD-10-CM | POA: Diagnosis not present

## 2011-09-19 DIAGNOSIS — J45909 Unspecified asthma, uncomplicated: Secondary | ICD-10-CM | POA: Diagnosis not present

## 2011-09-19 DIAGNOSIS — E039 Hypothyroidism, unspecified: Secondary | ICD-10-CM | POA: Diagnosis not present

## 2011-09-19 DIAGNOSIS — F319 Bipolar disorder, unspecified: Secondary | ICD-10-CM | POA: Diagnosis not present

## 2011-10-11 DIAGNOSIS — M546 Pain in thoracic spine: Secondary | ICD-10-CM | POA: Diagnosis not present

## 2011-10-11 DIAGNOSIS — M999 Biomechanical lesion, unspecified: Secondary | ICD-10-CM | POA: Diagnosis not present

## 2011-10-11 DIAGNOSIS — M9981 Other biomechanical lesions of cervical region: Secondary | ICD-10-CM | POA: Diagnosis not present

## 2011-10-11 DIAGNOSIS — M5412 Radiculopathy, cervical region: Secondary | ICD-10-CM | POA: Diagnosis not present

## 2011-10-13 DIAGNOSIS — M9981 Other biomechanical lesions of cervical region: Secondary | ICD-10-CM | POA: Diagnosis not present

## 2011-10-13 DIAGNOSIS — M999 Biomechanical lesion, unspecified: Secondary | ICD-10-CM | POA: Diagnosis not present

## 2011-10-13 DIAGNOSIS — M546 Pain in thoracic spine: Secondary | ICD-10-CM | POA: Diagnosis not present

## 2011-10-13 DIAGNOSIS — M5412 Radiculopathy, cervical region: Secondary | ICD-10-CM | POA: Diagnosis not present

## 2011-10-18 DIAGNOSIS — F3174 Bipolar disorder, in full remission, most recent episode manic: Secondary | ICD-10-CM | POA: Diagnosis not present

## 2011-10-19 DIAGNOSIS — M9981 Other biomechanical lesions of cervical region: Secondary | ICD-10-CM | POA: Diagnosis not present

## 2011-10-19 DIAGNOSIS — M546 Pain in thoracic spine: Secondary | ICD-10-CM | POA: Diagnosis not present

## 2011-10-19 DIAGNOSIS — M999 Biomechanical lesion, unspecified: Secondary | ICD-10-CM | POA: Diagnosis not present

## 2011-10-19 DIAGNOSIS — M5412 Radiculopathy, cervical region: Secondary | ICD-10-CM | POA: Diagnosis not present

## 2011-10-24 DIAGNOSIS — M999 Biomechanical lesion, unspecified: Secondary | ICD-10-CM | POA: Diagnosis not present

## 2011-10-24 DIAGNOSIS — M5412 Radiculopathy, cervical region: Secondary | ICD-10-CM | POA: Diagnosis not present

## 2011-10-24 DIAGNOSIS — M9981 Other biomechanical lesions of cervical region: Secondary | ICD-10-CM | POA: Diagnosis not present

## 2011-10-24 DIAGNOSIS — M546 Pain in thoracic spine: Secondary | ICD-10-CM | POA: Diagnosis not present

## 2011-11-14 DIAGNOSIS — R350 Frequency of micturition: Secondary | ICD-10-CM | POA: Diagnosis not present

## 2011-11-14 DIAGNOSIS — N3020 Other chronic cystitis without hematuria: Secondary | ICD-10-CM | POA: Diagnosis not present

## 2011-11-21 DIAGNOSIS — F3174 Bipolar disorder, in full remission, most recent episode manic: Secondary | ICD-10-CM | POA: Diagnosis not present

## 2011-12-01 DIAGNOSIS — C44711 Basal cell carcinoma of skin of unspecified lower limb, including hip: Secondary | ICD-10-CM | POA: Diagnosis not present

## 2011-12-01 DIAGNOSIS — L819 Disorder of pigmentation, unspecified: Secondary | ICD-10-CM | POA: Diagnosis not present

## 2011-12-01 DIAGNOSIS — D1801 Hemangioma of skin and subcutaneous tissue: Secondary | ICD-10-CM | POA: Diagnosis not present

## 2011-12-01 DIAGNOSIS — Z85828 Personal history of other malignant neoplasm of skin: Secondary | ICD-10-CM | POA: Diagnosis not present

## 2011-12-01 DIAGNOSIS — D485 Neoplasm of uncertain behavior of skin: Secondary | ICD-10-CM | POA: Diagnosis not present

## 2011-12-01 DIAGNOSIS — L821 Other seborrheic keratosis: Secondary | ICD-10-CM | POA: Diagnosis not present

## 2011-12-01 DIAGNOSIS — D239 Other benign neoplasm of skin, unspecified: Secondary | ICD-10-CM | POA: Diagnosis not present

## 2011-12-14 ENCOUNTER — Other Ambulatory Visit: Payer: Self-pay | Admitting: Gastroenterology

## 2011-12-14 DIAGNOSIS — Z86010 Personal history of colonic polyps: Secondary | ICD-10-CM | POA: Diagnosis not present

## 2011-12-14 DIAGNOSIS — D126 Benign neoplasm of colon, unspecified: Secondary | ICD-10-CM | POA: Diagnosis not present

## 2011-12-14 DIAGNOSIS — K648 Other hemorrhoids: Secondary | ICD-10-CM | POA: Diagnosis not present

## 2011-12-14 DIAGNOSIS — Z09 Encounter for follow-up examination after completed treatment for conditions other than malignant neoplasm: Secondary | ICD-10-CM | POA: Diagnosis not present

## 2011-12-23 DIAGNOSIS — Z01419 Encounter for gynecological examination (general) (routine) without abnormal findings: Secondary | ICD-10-CM | POA: Diagnosis not present

## 2011-12-23 DIAGNOSIS — Z124 Encounter for screening for malignant neoplasm of cervix: Secondary | ICD-10-CM | POA: Diagnosis not present

## 2011-12-27 DIAGNOSIS — E559 Vitamin D deficiency, unspecified: Secondary | ICD-10-CM | POA: Diagnosis not present

## 2011-12-27 DIAGNOSIS — I10 Essential (primary) hypertension: Secondary | ICD-10-CM | POA: Diagnosis not present

## 2011-12-27 DIAGNOSIS — E039 Hypothyroidism, unspecified: Secondary | ICD-10-CM | POA: Diagnosis not present

## 2011-12-27 DIAGNOSIS — R82998 Other abnormal findings in urine: Secondary | ICD-10-CM | POA: Diagnosis not present

## 2011-12-30 DIAGNOSIS — C44711 Basal cell carcinoma of skin of unspecified lower limb, including hip: Secondary | ICD-10-CM | POA: Diagnosis not present

## 2012-01-02 DIAGNOSIS — E039 Hypothyroidism, unspecified: Secondary | ICD-10-CM | POA: Diagnosis not present

## 2012-01-02 DIAGNOSIS — Z0000 Encounter for general adult medical examination without abnormal findings: Secondary | ICD-10-CM | POA: Diagnosis not present

## 2012-01-02 DIAGNOSIS — I10 Essential (primary) hypertension: Secondary | ICD-10-CM | POA: Diagnosis not present

## 2012-01-02 DIAGNOSIS — F319 Bipolar disorder, unspecified: Secondary | ICD-10-CM | POA: Diagnosis not present

## 2012-01-16 DIAGNOSIS — F3174 Bipolar disorder, in full remission, most recent episode manic: Secondary | ICD-10-CM | POA: Diagnosis not present

## 2012-01-26 DIAGNOSIS — C44711 Basal cell carcinoma of skin of unspecified lower limb, including hip: Secondary | ICD-10-CM | POA: Diagnosis not present

## 2012-02-27 DIAGNOSIS — F3132 Bipolar disorder, current episode depressed, moderate: Secondary | ICD-10-CM | POA: Diagnosis not present

## 2012-02-27 DIAGNOSIS — F3174 Bipolar disorder, in full remission, most recent episode manic: Secondary | ICD-10-CM | POA: Diagnosis not present

## 2012-04-09 DIAGNOSIS — F3174 Bipolar disorder, in full remission, most recent episode manic: Secondary | ICD-10-CM | POA: Diagnosis not present

## 2012-04-17 DIAGNOSIS — F3174 Bipolar disorder, in full remission, most recent episode manic: Secondary | ICD-10-CM | POA: Diagnosis not present

## 2012-05-15 ENCOUNTER — Telehealth: Payer: Self-pay | Admitting: Obstetrics and Gynecology

## 2012-05-15 NOTE — Telephone Encounter (Signed)
Patient understood of when and the importance of the TDaP vaccine. Was told that Medicare did not cover the vaccine and wanted confirmation of this. Explained to patient medicare does not cover the vaccine. Patient understands this.

## 2012-05-15 NOTE — Telephone Encounter (Signed)
When does patient need tdap again?

## 2012-05-29 DIAGNOSIS — F3174 Bipolar disorder, in full remission, most recent episode manic: Secondary | ICD-10-CM | POA: Diagnosis not present

## 2012-05-31 DIAGNOSIS — Z79899 Other long term (current) drug therapy: Secondary | ICD-10-CM | POA: Diagnosis not present

## 2012-05-31 DIAGNOSIS — F3132 Bipolar disorder, current episode depressed, moderate: Secondary | ICD-10-CM | POA: Diagnosis not present

## 2012-06-28 DIAGNOSIS — F3174 Bipolar disorder, in full remission, most recent episode manic: Secondary | ICD-10-CM | POA: Diagnosis not present

## 2012-07-09 DIAGNOSIS — I10 Essential (primary) hypertension: Secondary | ICD-10-CM | POA: Diagnosis not present

## 2012-07-09 DIAGNOSIS — E039 Hypothyroidism, unspecified: Secondary | ICD-10-CM | POA: Diagnosis not present

## 2012-07-09 DIAGNOSIS — J45909 Unspecified asthma, uncomplicated: Secondary | ICD-10-CM | POA: Diagnosis not present

## 2012-07-09 DIAGNOSIS — IMO0002 Reserved for concepts with insufficient information to code with codable children: Secondary | ICD-10-CM | POA: Diagnosis not present

## 2012-07-09 DIAGNOSIS — F319 Bipolar disorder, unspecified: Secondary | ICD-10-CM | POA: Diagnosis not present

## 2012-07-09 DIAGNOSIS — M899 Disorder of bone, unspecified: Secondary | ICD-10-CM | POA: Diagnosis not present

## 2012-07-09 DIAGNOSIS — Z1331 Encounter for screening for depression: Secondary | ICD-10-CM | POA: Diagnosis not present

## 2012-07-17 ENCOUNTER — Telehealth: Payer: Self-pay | Admitting: *Deleted

## 2012-07-17 NOTE — Telephone Encounter (Signed)
Called patient back and have scheduled her appt. With Dr. Tresa Res July 9th @ 2:45pm .

## 2012-07-17 NOTE — Telephone Encounter (Signed)
Patient called stating she if having frequent urination last couple of days.states very bad odor to urine. Twinge of pain with urination.No fever. States poor appetite. Was seen by Dr. Gildardo Griffes Diarmid and was told to use Estrace cream 2-3 x week. Was also given a Rx for nitro furadantin 100mg  capsule but did not use and wants to know if she should start the Rx given by Dr. Gildardo Griffes Diarmid. Please advise. sue

## 2012-07-18 ENCOUNTER — Encounter: Payer: Self-pay | Admitting: Obstetrics and Gynecology

## 2012-07-18 ENCOUNTER — Ambulatory Visit (INDEPENDENT_AMBULATORY_CARE_PROVIDER_SITE_OTHER): Payer: Medicare Other | Admitting: Obstetrics and Gynecology

## 2012-07-18 VITALS — BP 122/70 | Ht 65.75 in | Wt 126.0 lb

## 2012-07-18 DIAGNOSIS — N390 Urinary tract infection, site not specified: Secondary | ICD-10-CM

## 2012-07-18 LAB — POCT URINALYSIS DIPSTICK
Bilirubin, UA: NEGATIVE
Ketones, UA: NEGATIVE
Leukocytes, UA: NEGATIVE
pH, UA: 5

## 2012-07-18 NOTE — Patient Instructions (Signed)
We will call you with your urine culture results 

## 2012-07-18 NOTE — Progress Notes (Signed)
67 yo MWF c/o odor to urine since yesterday.  No dysuria, maybe a slight "twinge" when she voids.  She does have a h.o chronic cystitis,  2 small right kidney stones, and has medullary spone kidney, and has seen Dr. Perley Jain.  She uses a pea sized amount of estrace cream around urethra twice a week.  Exam:  Ext appears nl.  Vag and cx also nl.  BM:  Uterus mobile and small and NT.  Adnexa neg.  A:  Probable normal findings, but r/o UTI  P:  Urine culture, will call pt with results.

## 2012-07-21 LAB — URINE CULTURE

## 2012-07-23 ENCOUNTER — Other Ambulatory Visit: Payer: Self-pay | Admitting: Obstetrics and Gynecology

## 2012-07-23 ENCOUNTER — Other Ambulatory Visit: Payer: Self-pay | Admitting: *Deleted

## 2012-07-23 DIAGNOSIS — N390 Urinary tract infection, site not specified: Secondary | ICD-10-CM

## 2012-07-23 MED ORDER — NITROFURANTOIN MONOHYD MACRO 100 MG PO CAPS: 100.0000 mg | ORAL_CAPSULE | Freq: Two times a day (BID) | ORAL | Status: DC

## 2012-07-23 NOTE — Telephone Encounter (Signed)
Macrobid bid x7days 0 refills was sent to [facility]

## 2012-08-01 DIAGNOSIS — F3174 Bipolar disorder, in full remission, most recent episode manic: Secondary | ICD-10-CM | POA: Diagnosis not present

## 2012-08-01 DIAGNOSIS — N6489 Other specified disorders of breast: Secondary | ICD-10-CM | POA: Diagnosis not present

## 2012-08-06 ENCOUNTER — Other Ambulatory Visit: Payer: Medicare Other

## 2012-08-08 ENCOUNTER — Ambulatory Visit (INDEPENDENT_AMBULATORY_CARE_PROVIDER_SITE_OTHER): Payer: Medicare Other

## 2012-08-08 VITALS — BP 124/70 | HR 80 | Ht 65.75 in | Wt 123.5 lb

## 2012-08-08 DIAGNOSIS — N390 Urinary tract infection, site not specified: Secondary | ICD-10-CM

## 2012-08-09 ENCOUNTER — Other Ambulatory Visit: Payer: Medicare Other

## 2012-08-10 ENCOUNTER — Other Ambulatory Visit: Payer: Self-pay | Admitting: Obstetrics and Gynecology

## 2012-08-10 DIAGNOSIS — F411 Generalized anxiety disorder: Secondary | ICD-10-CM | POA: Diagnosis not present

## 2012-08-10 DIAGNOSIS — N390 Urinary tract infection, site not specified: Secondary | ICD-10-CM | POA: Diagnosis not present

## 2012-08-10 DIAGNOSIS — IMO0002 Reserved for concepts with insufficient information to code with codable children: Secondary | ICD-10-CM | POA: Diagnosis not present

## 2012-08-10 DIAGNOSIS — R82998 Other abnormal findings in urine: Secondary | ICD-10-CM | POA: Diagnosis not present

## 2012-08-11 LAB — URINE CULTURE

## 2012-08-15 ENCOUNTER — Ambulatory Visit (INDEPENDENT_AMBULATORY_CARE_PROVIDER_SITE_OTHER): Payer: Medicare Other | Admitting: *Deleted

## 2012-08-15 VITALS — BP 120/82 | HR 88 | Temp 98.1°F | Ht 65.75 in | Wt 126.0 lb

## 2012-08-15 DIAGNOSIS — N390 Urinary tract infection, site not specified: Secondary | ICD-10-CM | POA: Diagnosis not present

## 2012-08-15 NOTE — Patient Instructions (Signed)
We will call you when we have results of urine culture.

## 2012-08-15 NOTE — Progress Notes (Signed)
Pt presented for repeat urine culture.  She states she completed Macrobid, and is feeling fine.  Pt states she is not having any "tingling". Urine sent for culture.

## 2012-08-17 ENCOUNTER — Telehealth: Payer: Self-pay | Admitting: *Deleted

## 2012-08-17 MED ORDER — CIPROFLOXACIN HCL 500 MG PO TABS: 500.0000 mg | ORAL_TABLET | Freq: Two times a day (BID) | ORAL | Status: DC

## 2012-08-17 NOTE — Telephone Encounter (Signed)
Spoke with patient about her + urine culture results, and that Dr. Tresa Res wants her to take Cipro 500mg  bid x 7days. And she needs to come back in for a urine TOC in 2 weeks. Pt immediately said that her Psychiatrist does not want her to take  Any medication without notifying him first, I tried to explain to her that if she didn't take this medication the urinary infection will get worse Pt said she needs to call her Psychiatrist I told her okay. Rx was sent to [facility] [address] I will call patient back next week To schedule a two week TOC.

## 2012-08-17 NOTE — Telephone Encounter (Signed)
Message copied by Ernest Haber on Fri Aug 17, 2012  4:23 PM ------      Message from: Alison Murray      Created: Fri Aug 17, 2012  9:14 AM       Tell pt + UTI.  Rx cipro 500 mg bid for 7days and recheck urine culture in 2 weeks. ------

## 2012-08-18 LAB — URINE CULTURE: Colony Count: >=100000

## 2012-08-21 DIAGNOSIS — F3174 Bipolar disorder, in full remission, most recent episode manic: Secondary | ICD-10-CM | POA: Diagnosis not present

## 2012-08-24 DIAGNOSIS — F3132 Bipolar disorder, current episode depressed, moderate: Secondary | ICD-10-CM | POA: Diagnosis not present

## 2012-09-03 DIAGNOSIS — N390 Urinary tract infection, site not specified: Secondary | ICD-10-CM | POA: Diagnosis not present

## 2012-09-03 DIAGNOSIS — R82998 Other abnormal findings in urine: Secondary | ICD-10-CM | POA: Diagnosis not present

## 2012-09-03 DIAGNOSIS — F430 Acute stress reaction: Secondary | ICD-10-CM | POA: Diagnosis not present

## 2012-09-03 DIAGNOSIS — F411 Generalized anxiety disorder: Secondary | ICD-10-CM | POA: Diagnosis not present

## 2012-09-03 DIAGNOSIS — F319 Bipolar disorder, unspecified: Secondary | ICD-10-CM | POA: Diagnosis not present

## 2012-09-03 DIAGNOSIS — E039 Hypothyroidism, unspecified: Secondary | ICD-10-CM | POA: Diagnosis not present

## 2012-09-11 ENCOUNTER — Telehealth: Payer: Self-pay | Admitting: *Deleted

## 2012-09-11 ENCOUNTER — Emergency Department (HOSPITAL_COMMUNITY)
Admission: EM | Admit: 2012-09-11 | Discharge: 2012-09-11 | Disposition: A | Discharge status: Home / Self Care | Payer: Medicare Other | Attending: Emergency Medicine | Admitting: Emergency Medicine

## 2012-09-11 ENCOUNTER — Encounter (HOSPITAL_COMMUNITY): Payer: Self-pay

## 2012-09-11 DIAGNOSIS — F341 Dysthymic disorder: Secondary | ICD-10-CM | POA: Diagnosis not present

## 2012-09-11 DIAGNOSIS — F29 Unspecified psychosis not due to a substance or known physiological condition: Secondary | ICD-10-CM | POA: Diagnosis not present

## 2012-09-11 DIAGNOSIS — Z79899 Other long term (current) drug therapy: Secondary | ICD-10-CM | POA: Diagnosis not present

## 2012-09-11 DIAGNOSIS — E213 Hyperparathyroidism, unspecified: Secondary | ICD-10-CM | POA: Insufficient documentation

## 2012-09-11 DIAGNOSIS — F411 Generalized anxiety disorder: Secondary | ICD-10-CM | POA: Insufficient documentation

## 2012-09-11 DIAGNOSIS — Z87440 Personal history of urinary (tract) infections: Secondary | ICD-10-CM | POA: Insufficient documentation

## 2012-09-11 DIAGNOSIS — F319 Bipolar disorder, unspecified: Secondary | ICD-10-CM | POA: Diagnosis not present

## 2012-09-11 DIAGNOSIS — R41840 Attention and concentration deficit: Secondary | ICD-10-CM

## 2012-09-11 DIAGNOSIS — F329 Major depressive disorder, single episode, unspecified: Secondary | ICD-10-CM

## 2012-09-11 DIAGNOSIS — F32A Depression, unspecified: Secondary | ICD-10-CM

## 2012-09-11 HISTORY — DX: Major depressive disorder, single episode, unspecified: F32.9

## 2012-09-11 HISTORY — DX: Disorder of thyroid, unspecified: E07.9

## 2012-09-11 HISTORY — DX: Depression, unspecified: F32.A

## 2012-09-11 HISTORY — DX: Disorientation, unspecified: R41.0

## 2012-09-11 LAB — BASIC METABOLIC PANEL WITH GFR
BUN: 10 mg/dL (ref 6–23)
CO2: 28 meq/L (ref 19–32)
Calcium: 10.8 mg/dL — ABNORMAL HIGH (ref 8.4–10.5)
Chloride: 107 meq/L (ref 96–112)
Creatinine, Ser: 0.55 mg/dL (ref 0.50–1.10)
GFR calc Af Amer: >90 mL/min
GFR calc non Af Amer: >90 mL/min
Glucose, Bld: 96 mg/dL (ref 70–99)
Potassium: 3.6 meq/L (ref 3.5–5.1)
Sodium: 142 meq/L (ref 135–145)

## 2012-09-11 LAB — CBC WITH DIFFERENTIAL/PLATELET
Eosinophils Absolute: 0.1 10*3/uL (ref 0.0–0.7)
Eosinophils Relative: 1 % (ref 0–5)
HCT: 42.4 % (ref 36.0–46.0)
Hemoglobin: 13.7 g/dL (ref 12.0–15.0)
Lymphs Abs: 1.1 10*3/uL (ref 0.7–4.0)
MCH: 30.8 pg (ref 26.0–34.0)
MCV: 95.3 fL (ref 78.0–100.0)
Monocytes Absolute: 0.6 10*3/uL (ref 0.1–1.0)
Monocytes Relative: 7 % (ref 3–12)
Platelets: 186 10*3/uL (ref 150–400)
RBC: 4.45 MIL/uL (ref 3.87–5.11)

## 2012-09-11 LAB — URINALYSIS, ROUTINE W REFLEX MICROSCOPIC
Bilirubin Urine: NEGATIVE
Glucose, UA: NEGATIVE mg/dL
Hgb urine dipstick: NEGATIVE
Protein, ur: NEGATIVE mg/dL
Urobilinogen, UA: 0.2 mg/dL (ref 0.0–1.0)

## 2012-09-11 LAB — TSH: TSH: 0.968 u[IU]/mL (ref 0.350–4.500)

## 2012-09-11 NOTE — ED Notes (Signed)
Pt denies AH/VH or SI, pts daughter states they are worried about her confusion

## 2012-09-11 NOTE — ED Notes (Signed)
Pt sent here from her therapist to be checked and send to [facility]; pt c/o confusion, can't keep track of time, place or name; pt alert and oriented to self and place

## 2012-09-11 NOTE — ED Notes (Signed)
Patient reports in the last several weeks she has been getting progressively more confused until this morning she called her daughter numerous times asking when her hair appointment was.  Patient has stopped driving due to getting lost.  Patient is not able to tell what time it is.  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  She has had some family losses recently, sister and mother.

## 2012-09-11 NOTE — Telephone Encounter (Signed)
Spoke with patient about scheduling a 2 week TOC, she was treated with Cipro 500mg  bid x 7days. Patient said she is doing good She had an appointment last week with Dr. Jacky Kindle, and his office checked her urine and everything was normal. Pt. Is on her Way to the ER because she said "she has loss her mind." Will call our office if any other urinary problems. cm

## 2012-09-13 NOTE — ED Provider Notes (Signed)
CSN: [id number]     Arrival date & time 09/11/12  1141 History   First MD Initiated Contact with Patient 09/11/12 1228     Chief Complaint  Patient presents with  . Altered Mental Status    x1week   (Consider location/radiation/quality/duration/timing/severity/associated sxs/prior Treatment) HPI Comments:  Pt presents w/ daughter for complaint of intermittent confusion described as difficulty remembering times, locations, and directions over the past two weeks.  Pt has good insight into this, states she has been very anxious about it.  She otherwise has been well and denies feer, chills, n/v, d/a, change in appetite, cough, chest pain, abdominal pain.  Sleep is sometimes poor.   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  She takes daily scheduled benzos and zyprexa.    Past Medical History  Diagnosis Date  . Hyperparathyroidism   . UTI (lower urinary tract infection)   . Anxiety   . Bipolar 1 disorder   . Thyroid disease   . Depression   . Confusion    Past Surgical History  Procedure Laterality Date  . Breast surgery      breast biopsy (Rt)  . Thyroid surgery  2011, 2004    Para Thyroid   Family History  Problem Relation Age of Onset  . Alzheimer's disease Mother   . Stroke Mother   . Diabetes Father   . Alzheimer's disease Father   . Breast cancer Sister    History  Substance Use Topics  . Smoking status: Never Smoker   . Smokeless tobacco: Not on file  . Alcohol Use: No   OB History   Grav Para Term Preterm Abortions TAB SAB Ect Mult Living   2 2        2      Review of Systems  Constitutional: Negative for fever, chills, diaphoresis, activity change, appetite change and fatigue.  HENT: Negative for congestion, sore throat, facial swelling, rhinorrhea, neck pain and neck stiffness.   Eyes: Negative for photophobia and discharge.  Respiratory: Negative for cough, chest tightness and shortness of breath.   Cardiovascular: Negative for chest pain,  palpitations and leg swelling.  Gastrointestinal: Negative for nausea, vomiting, abdominal pain and diarrhea.  Endocrine: Negative for polydipsia and polyuria.  Genitourinary: Negative for dysuria, frequency, difficulty urinating and pelvic pain.  Musculoskeletal: Negative for back pain and arthralgias.  Skin: Negative for color change and wound.  Allergic/Immunologic: Negative for immunocompromised state.  Neurological: Negative for facial asymmetry, weakness, numbness and headaches.  Hematological: Does not bruise/bleed easily.  Psychiatric/Behavioral: Positive for confusion and decreased concentration. Negative for agitation.    Allergies  Review of patient's allergies indicates no known allergies.  Home Medications   Current Outpatient Rx  Name  Route  Sig  Dispense  Refill  . cholecalciferol (VITAMIN D) 1000 UNITS tablet   Oral   Take 1,000 Units by mouth daily.         [name] levothyroxine (SYNTHROID, LEVOTHROID) 88 MCG tablet   Oral   Take 88 mcg by mouth daily before breakfast.         . lithium carbonate 150 MG capsule   Oral   Take 300 mg by mouth 2 (two) times daily with a meal.          . loratadine (CLARITIN) 10 MG tablet   Oral   Take 10 mg by mouth daily.         [name] LORazepam (ATIVAN) 0.5 MG tablet   Oral   Take  0.5 mg by mouth 3 (three) times daily. For anxiety         . OLANZapine (ZYPREXA) 2.5 MG tablet   Oral   Take 3.75 mg by mouth at bedtime. Takes 1 and 1/2          BP 152/72  Pulse 79  Temp(Src) 97.5 F (36.4 C) (Oral)  Resp 16  SpO2 100% Physical Exam  Constitutional: She is oriented to person, place, and time. She appears well-developed and well-nourished. No distress.  HENT:  Head: Normocephalic and atraumatic.  Mouth/Throat: No oropharyngeal exudate.  Eyes: Pupils are equal, round, and reactive to light.  Neck: Normal range of motion. Neck supple.  Cardiovascular: Normal rate, regular rhythm and normal heart sounds.  Exam  reveals no gallop and no friction rub.   No murmur heard. Pulmonary/Chest: Effort normal and breath sounds normal. No respiratory distress. She has no wheezes. She has no rales.  Abdominal: Soft. Bowel sounds are normal. She exhibits no distension and no mass. There is no tenderness. There is no rebound and no guarding.  Musculoskeletal: Normal range of motion. She exhibits no edema and no tenderness.  Neurological: She is alert and oriented to person, place, and time. She has normal strength. She displays no tremor. No cranial nerve deficit or sensory deficit. She exhibits normal muscle tone. She displays a negative Romberg sign. Coordination and gait normal. GCS eye subscore is 4. GCS verbal subscore is 5. GCS motor subscore is 6.  Skin: Skin is warm and dry.  Psychiatric: She has a normal mood and affect.    ED Course  Procedures (including critical care time) Labs Review Labs Reviewed  CBC WITH DIFFERENTIAL - Abnormal; Notable for the following:    Neutrophils Relative % 79 (*)    All other components within normal limits  BASIC METABOLIC PANEL - Abnormal; Notable for the following:    Calcium 10.8 (*)    All other components within normal limits  URINALYSIS, ROUTINE W REFLEX MICROSCOPIC  TSH   Imaging Review No results found.  MDM   1. Difficulty concentrating   2. Anxiety and depression   3. Hypercalcemia     Pt presents w/ daughter for complaint of intermittent confusion described as difficulty remembering times, locations, and directions over the past two weeks.  Pt has good insight into this, states she has been very anxious about it.  She otherwise has been well.  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  She takes daily scheduled benzos and zyprexa.  PE as well as complete neuro exam unremarkable.  She seems appropriate in speec & content, is able to tell me exact date, time, and name of her psychiatrist she has an appointment with next week.  CBC, BMP, UA, TSH  unremarkable for cause of symptoms.  She has chronic, unchanged hypercalcemia that I have asked her to discuss with her PCP.  I believe she is safe for continued outpt f/u for these issues and doubt acute organic issue.  She may be displaying symptoms of anxiety, depression, or early dementia.  She denies SI/HI, AVH.  Return precautions given for new or worsening symptoms    Shanna Cisco, MD 09/13/12 2333

## 2012-09-19 DIAGNOSIS — F3174 Bipolar disorder, in full remission, most recent episode manic: Secondary | ICD-10-CM | POA: Diagnosis not present

## 2012-09-20 DIAGNOSIS — Z23 Encounter for immunization: Secondary | ICD-10-CM | POA: Diagnosis not present

## 2012-09-24 ENCOUNTER — Encounter: Payer: Self-pay | Admitting: Obstetrics and Gynecology

## 2012-09-25 DIAGNOSIS — E039 Hypothyroidism, unspecified: Secondary | ICD-10-CM | POA: Diagnosis not present

## 2012-09-25 DIAGNOSIS — M899 Disorder of bone, unspecified: Secondary | ICD-10-CM | POA: Diagnosis not present

## 2012-09-25 DIAGNOSIS — F319 Bipolar disorder, unspecified: Secondary | ICD-10-CM | POA: Diagnosis not present

## 2012-09-25 DIAGNOSIS — IMO0002 Reserved for concepts with insufficient information to code with codable children: Secondary | ICD-10-CM | POA: Diagnosis not present

## 2012-10-01 DIAGNOSIS — K219 Gastro-esophageal reflux disease without esophagitis: Secondary | ICD-10-CM | POA: Diagnosis not present

## 2012-10-01 DIAGNOSIS — K5900 Constipation, unspecified: Secondary | ICD-10-CM | POA: Diagnosis not present

## 2012-10-01 DIAGNOSIS — E039 Hypothyroidism, unspecified: Secondary | ICD-10-CM | POA: Diagnosis not present

## 2012-10-01 DIAGNOSIS — Z23 Encounter for immunization: Secondary | ICD-10-CM | POA: Diagnosis not present

## 2012-10-02 NOTE — Telephone Encounter (Signed)
See previous phone notes 

## 2012-10-04 IMAGING — CT CT HEAD W/O CM
1 series · 16 of 30 positions shown, 20 images · non-contrast
Comparison: None.

CLINICAL DATA: Dizziness and confusion.  Altered mental status and
facial droop.

CT HEAD WITHOUT CONTRAST
TECHNIQUE: Contiguous axial images were obtained from the base of
the skull through the vertex without contrast.

[Series 2: head routine 4.8 h37s · axial · 0.46mm/px · z∈[-155,-25]mm · 16 of 30 slices shown, 20 images]
[im 2/30  brain]
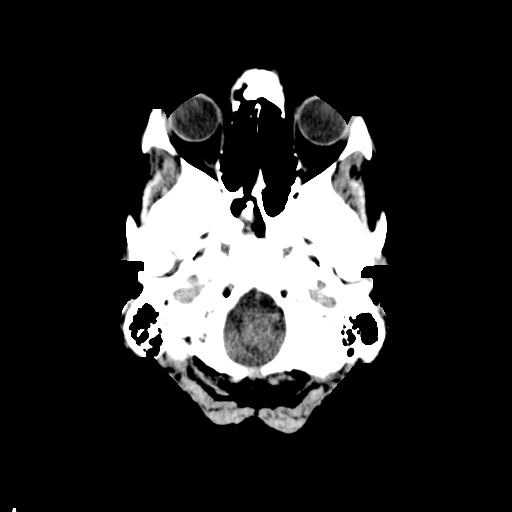
[im 2/30  bone]
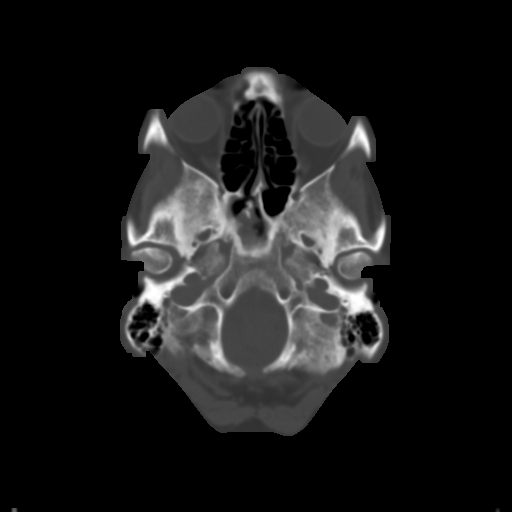
[im 4/30  brain]
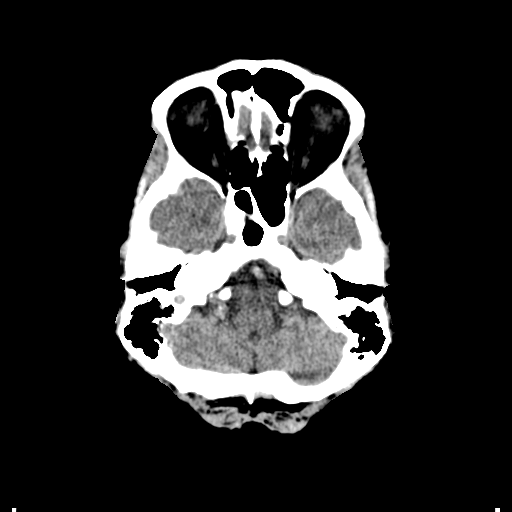
[im 6/30  brain]
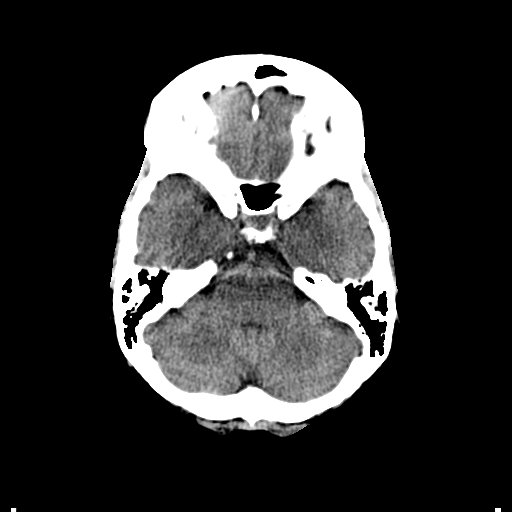
[im 8/30  brain]
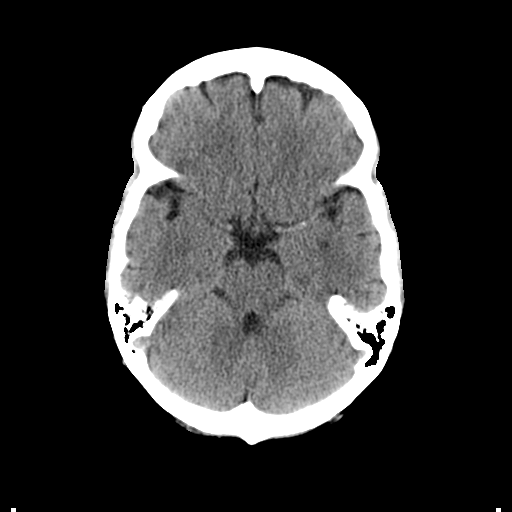
[im 9/30  brain]
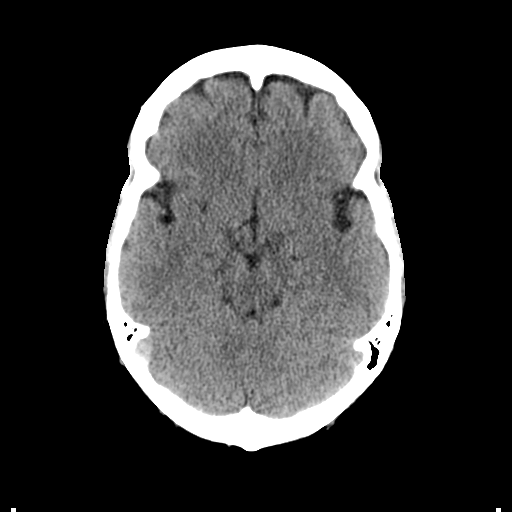
[im 9/30  bone]
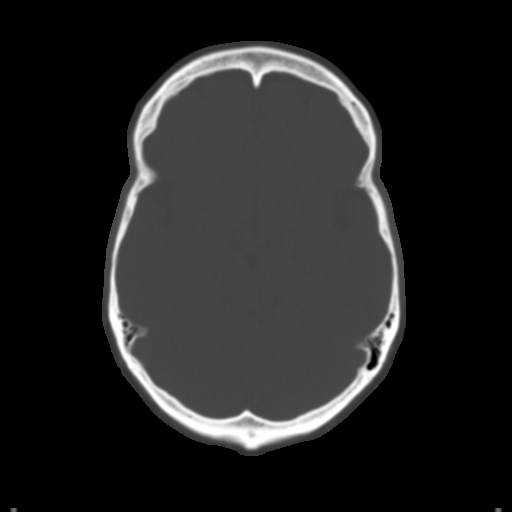
[im 11/30  brain]
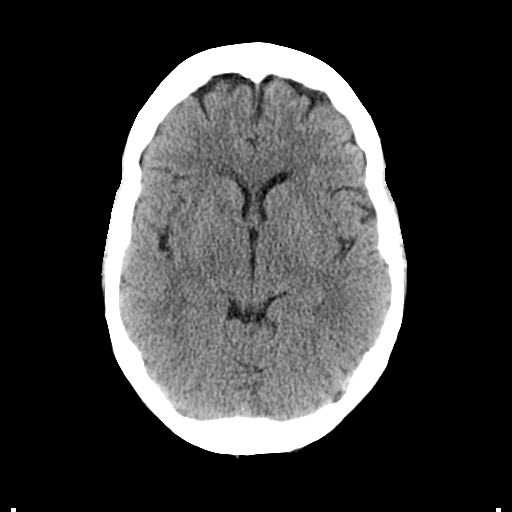
[im 13/30  brain]
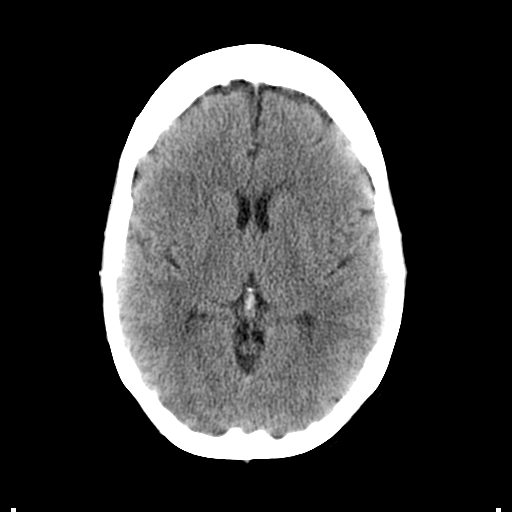
[im 15/30  brain]
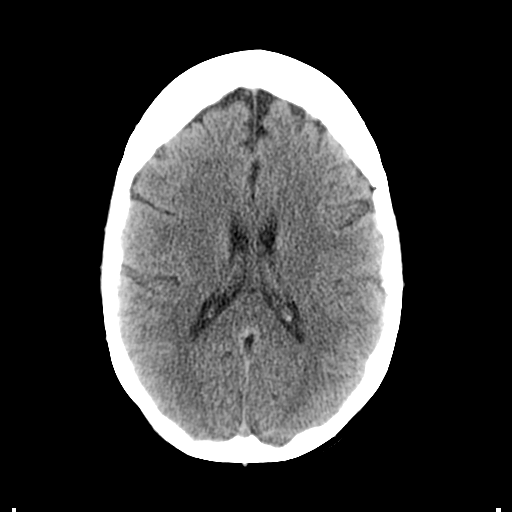
[im 16/30  brain]
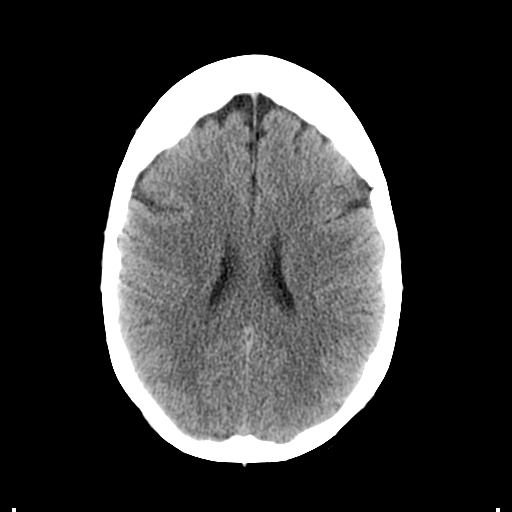
[im 16/30  bone]
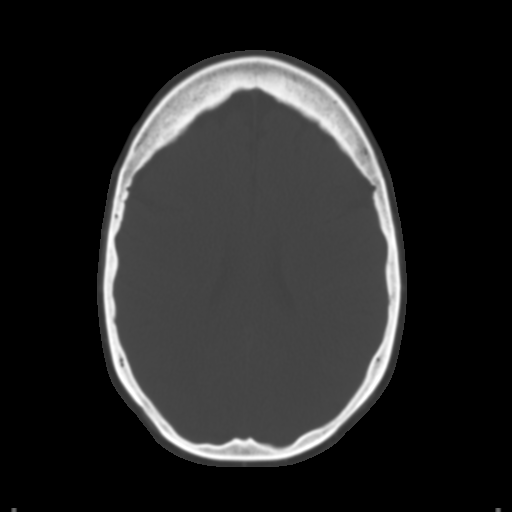
[im 18/30  brain]
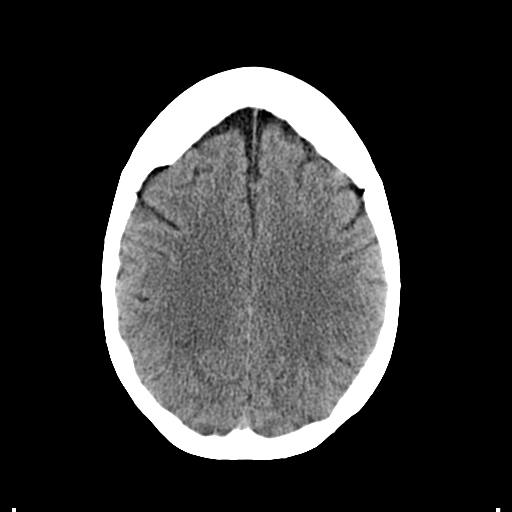
[im 20/30  brain]
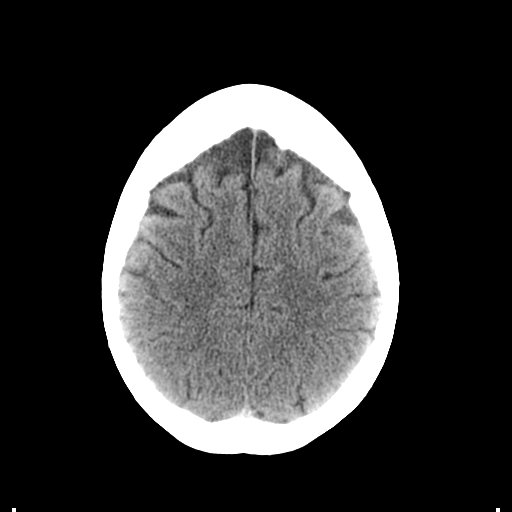
[im 22/30  brain]
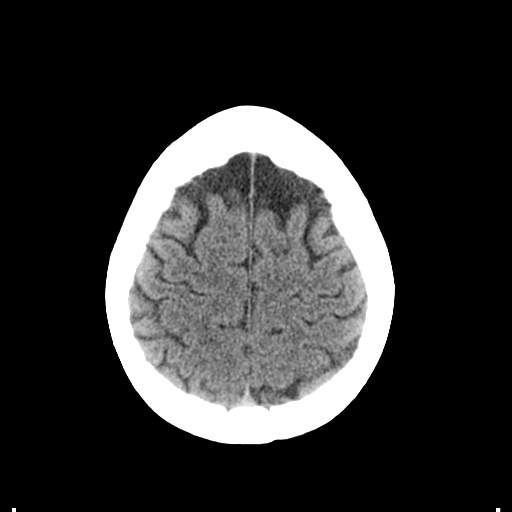
[im 23/30  brain]
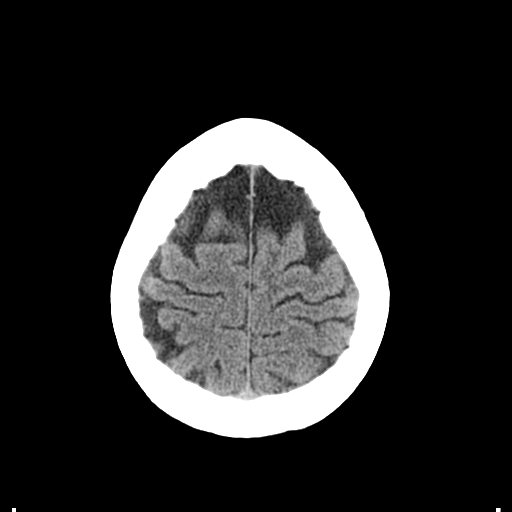
[im 23/30  bone]
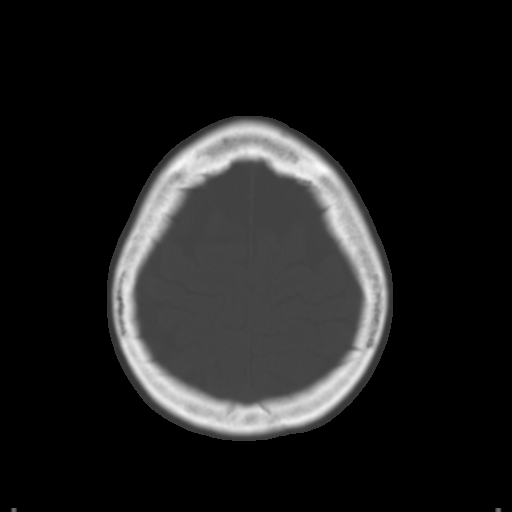
[im 25/30  brain]
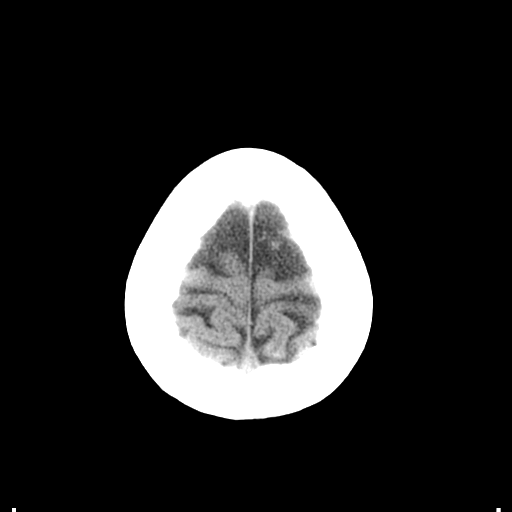
[im 27/30  brain]
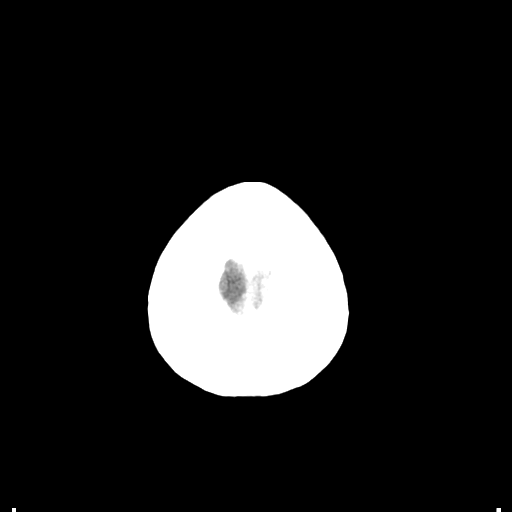
[im 29/30  brain]
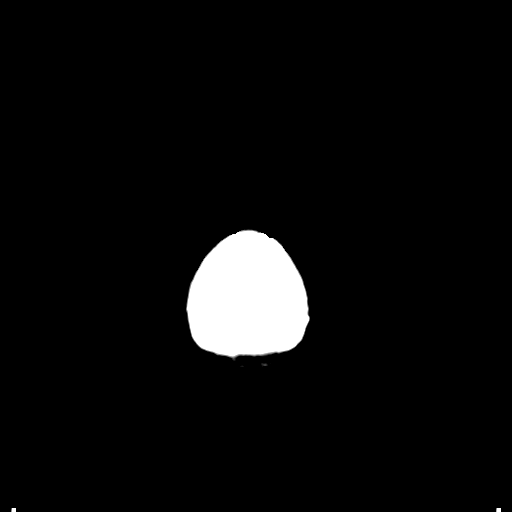

[16 of 30 positions shown; findings below may reference images not displayed]

FINDINGS: The brain shows mild generalized atrophy.  There is a
focal low density in the right basal ganglia/anterior limb internal
capsule consistent with an infarction, age indeterminate.  This
could be old.  No evidence of hemorrhage, mass lesion,
hydrocephalus or extra-axial collection.  The calvarium is
unremarkable.  There are chronic inflammatory changes of the right
division of the sphenoid sinus.
IMPRESSION: No definite acute infarction.  Small area of low density in the
right basal ganglia/anterior limb internal capsule consistent with
infarction of indeterminate age, possibly old.

## 2012-10-31 DIAGNOSIS — Z79899 Other long term (current) drug therapy: Secondary | ICD-10-CM | POA: Diagnosis not present

## 2012-11-06 DIAGNOSIS — F3174 Bipolar disorder, in full remission, most recent episode manic: Secondary | ICD-10-CM | POA: Diagnosis not present

## 2012-11-12 DIAGNOSIS — K5900 Constipation, unspecified: Secondary | ICD-10-CM | POA: Diagnosis not present

## 2012-11-12 DIAGNOSIS — E039 Hypothyroidism, unspecified: Secondary | ICD-10-CM | POA: Diagnosis not present

## 2012-11-12 DIAGNOSIS — F0390 Unspecified dementia without behavioral disturbance: Secondary | ICD-10-CM | POA: Diagnosis not present

## 2012-11-12 DIAGNOSIS — F319 Bipolar disorder, unspecified: Secondary | ICD-10-CM | POA: Diagnosis not present

## 2012-11-12 DIAGNOSIS — K219 Gastro-esophageal reflux disease without esophagitis: Secondary | ICD-10-CM | POA: Diagnosis not present

## 2012-11-12 DIAGNOSIS — J309 Allergic rhinitis, unspecified: Secondary | ICD-10-CM | POA: Diagnosis not present

## 2012-11-12 DIAGNOSIS — F411 Generalized anxiety disorder: Secondary | ICD-10-CM | POA: Diagnosis not present

## 2012-11-13 DIAGNOSIS — F411 Generalized anxiety disorder: Secondary | ICD-10-CM | POA: Diagnosis not present

## 2012-11-13 DIAGNOSIS — J309 Allergic rhinitis, unspecified: Secondary | ICD-10-CM | POA: Diagnosis not present

## 2012-11-13 DIAGNOSIS — F319 Bipolar disorder, unspecified: Secondary | ICD-10-CM | POA: Diagnosis not present

## 2012-11-13 DIAGNOSIS — E559 Vitamin D deficiency, unspecified: Secondary | ICD-10-CM | POA: Diagnosis not present

## 2012-11-13 DIAGNOSIS — E039 Hypothyroidism, unspecified: Secondary | ICD-10-CM | POA: Diagnosis not present

## 2012-11-13 DIAGNOSIS — N390 Urinary tract infection, site not specified: Secondary | ICD-10-CM | POA: Diagnosis not present

## 2012-11-14 DIAGNOSIS — N189 Chronic kidney disease, unspecified: Secondary | ICD-10-CM | POA: Diagnosis not present

## 2012-11-14 DIAGNOSIS — F319 Bipolar disorder, unspecified: Secondary | ICD-10-CM | POA: Diagnosis not present

## 2012-11-14 DIAGNOSIS — F411 Generalized anxiety disorder: Secondary | ICD-10-CM | POA: Diagnosis not present

## 2012-11-14 DIAGNOSIS — N390 Urinary tract infection, site not specified: Secondary | ICD-10-CM | POA: Diagnosis not present

## 2012-11-14 DIAGNOSIS — Z79899 Other long term (current) drug therapy: Secondary | ICD-10-CM | POA: Diagnosis not present

## 2012-11-14 DIAGNOSIS — D649 Anemia, unspecified: Secondary | ICD-10-CM | POA: Diagnosis not present

## 2012-11-14 DIAGNOSIS — E559 Vitamin D deficiency, unspecified: Secondary | ICD-10-CM | POA: Diagnosis not present

## 2012-11-14 DIAGNOSIS — E785 Hyperlipidemia, unspecified: Secondary | ICD-10-CM | POA: Diagnosis not present

## 2012-11-16 DIAGNOSIS — F319 Bipolar disorder, unspecified: Secondary | ICD-10-CM | POA: Diagnosis not present

## 2012-11-16 DIAGNOSIS — D649 Anemia, unspecified: Secondary | ICD-10-CM | POA: Diagnosis not present

## 2012-11-16 DIAGNOSIS — Z79899 Other long term (current) drug therapy: Secondary | ICD-10-CM | POA: Diagnosis not present

## 2012-11-16 DIAGNOSIS — F411 Generalized anxiety disorder: Secondary | ICD-10-CM | POA: Diagnosis not present

## 2012-11-16 DIAGNOSIS — N189 Chronic kidney disease, unspecified: Secondary | ICD-10-CM | POA: Diagnosis not present

## 2012-11-16 DIAGNOSIS — E559 Vitamin D deficiency, unspecified: Secondary | ICD-10-CM | POA: Diagnosis not present

## 2012-11-16 DIAGNOSIS — E785 Hyperlipidemia, unspecified: Secondary | ICD-10-CM | POA: Diagnosis not present

## 2012-11-16 DIAGNOSIS — N390 Urinary tract infection, site not specified: Secondary | ICD-10-CM | POA: Diagnosis not present

## 2012-11-29 DIAGNOSIS — N390 Urinary tract infection, site not specified: Secondary | ICD-10-CM | POA: Diagnosis not present

## 2012-12-11 DIAGNOSIS — D1801 Hemangioma of skin and subcutaneous tissue: Secondary | ICD-10-CM | POA: Diagnosis not present

## 2012-12-11 DIAGNOSIS — L821 Other seborrheic keratosis: Secondary | ICD-10-CM | POA: Diagnosis not present

## 2012-12-11 DIAGNOSIS — Z85828 Personal history of other malignant neoplasm of skin: Secondary | ICD-10-CM | POA: Diagnosis not present

## 2012-12-11 DIAGNOSIS — L820 Inflamed seborrheic keratosis: Secondary | ICD-10-CM | POA: Diagnosis not present

## 2012-12-11 DIAGNOSIS — L988 Other specified disorders of the skin and subcutaneous tissue: Secondary | ICD-10-CM | POA: Diagnosis not present

## 2012-12-11 DIAGNOSIS — L819 Disorder of pigmentation, unspecified: Secondary | ICD-10-CM | POA: Diagnosis not present

## 2012-12-11 DIAGNOSIS — D239 Other benign neoplasm of skin, unspecified: Secondary | ICD-10-CM | POA: Diagnosis not present

## 2012-12-19 DIAGNOSIS — F3174 Bipolar disorder, in full remission, most recent episode manic: Secondary | ICD-10-CM | POA: Diagnosis not present

## 2012-12-26 ENCOUNTER — Ambulatory Visit: Payer: Self-pay | Admitting: Obstetrics and Gynecology

## 2012-12-27 ENCOUNTER — Ambulatory Visit: Payer: Self-pay | Admitting: Obstetrics and Gynecology

## 2013-01-11 ENCOUNTER — Ambulatory Visit (INDEPENDENT_AMBULATORY_CARE_PROVIDER_SITE_OTHER): Payer: Medicare Other | Admitting: Obstetrics and Gynecology

## 2013-01-11 ENCOUNTER — Encounter: Payer: Self-pay | Admitting: Obstetrics and Gynecology

## 2013-01-11 VITALS — BP 108/64 | HR 76 | Ht 65.5 in | Wt 118.0 lb

## 2013-01-11 DIAGNOSIS — Z0000 Encounter for general adult medical examination without abnormal findings: Secondary | ICD-10-CM | POA: Diagnosis not present

## 2013-01-11 DIAGNOSIS — Z124 Encounter for screening for malignant neoplasm of cervix: Secondary | ICD-10-CM

## 2013-01-11 DIAGNOSIS — Z01419 Encounter for gynecological examination (general) (routine) without abnormal findings: Secondary | ICD-10-CM

## 2013-01-11 LAB — POCT URINALYSIS DIPSTICK
Leukocytes, UA: NEGATIVE
UROBILINOGEN UA: NEGATIVE
pH, UA: 5

## 2013-01-11 NOTE — Patient Instructions (Signed)

## 2013-01-11 NOTE — Progress Notes (Addendum)
Patient ID: Amy [name], female   DOB: 21-Jun-1945, 68 y.o.   MRN: [id number] GYNECOLOGY VISIT  PCP:   Lynne Leader, MD  Referring provider:   HPI: 68 y.o.   Married  Caucasian  female   G2P2 with No LMP recorded. Patient is postmenopausal.   here for   AEX.  Moved into assisted living.  Husband has Alzheimer. Lost mother and sister last year.  Selling home.  History of urinary tract infections.   Saw Dr. 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   Not really using vaginal/periurethral estrogen cream.  Did not get much benefit.   Hgb:  Not performed. Urine:  negative  GYNECOLOGIC HISTORY: No LMP recorded. Patient is postmenopausal. Sexually active:  no Partner preference: female Contraception:  Postmenopausal  Menopausal hormone therapy: no DES exposure:   no Blood transfusions:   no Sexually transmitted diseases: no   GYN Procedures:  no Mammogram:  07-26-11 ACZ:YSAYT               Pap:   10-17-07 wnl History of abnormal pap smear:  no   OB History   Grav Para Term Preterm Abortions TAB SAB Ect Mult Living   2 2        2        LIFESTYLE: Exercise:     Some walking.  Difficulty to exercise at current assisted living. Machines not working.        Tobacco:     no Alcohol:       no Drug use:    no  OTHER HEALTH MAINTENANCE: Tetanus/TDap:    Up to date with PCP Gardisil:               n/a Influenza:             10/2012 Zostavax:              With PCP  Bone density:      08-16-11 stable osteopenia:[facility] Colonoscopy:      2013 revealed a polyp.  Next colonoscopy due 2018  Cholesterol check: borderline with PCP  Family History  Problem Relation Age of Onset  . Alzheimer's disease Mother   . Stroke Mother   . Hypertension Mother   . Diabetes Father   . Alzheimer's disease Father   . Cancer Father     colon  . Breast cancer Sister     There are no active problems to display for this  patient.  Past Medical History  Diagnosis Date  . Hyperparathyroidism   . UTI (lower urinary tract infection)   . Anxiety   . Bipolar 1 disorder   . Thyroid disease   . Depression   . Confusion   . Cancer     hx of skin cancer  . Fibroid   . Osteopenia     Past Surgical History  Procedure Laterality Date  . Breast surgery      breast biopsy (Rt)  . Thyroid surgery  2011, 2004    Para Thyroid  . Skin cancer excision      hands and thigh  . Liposuction trunk      and placed fat tissue in vocal cords    ALLERGIES: Review of patient's allergies indicates no known allergies.  Current Outpatient Prescriptions  Medication Sig Dispense Refill  . acetaminophen (TYLENOL) 500 MG tablet Take 1,000 mg by mouth every 4 (four) hours as needed.      [name]  Cholecalciferol (VITAMIN D) 400 UNITS capsule Take 400 Units by mouth daily. Takes 1/2 tablet daily      . esomeprazole (NEXIUM) 40 MG capsule Take 40 mg by mouth daily at 12 noon.      [name] levothyroxine (SYNTHROID, LEVOTHROID) 88 MCG tablet Take 88 mcg by mouth daily before breakfast.      . lithium carbonate 150 MG capsule Take 150 mg by mouth 2 (two) times daily with a meal.       . loratadine (CLARITIN) 10 MG tablet Take 10 mg by mouth daily.      [name] LORazepam (ATIVAN) 0.5 MG tablet Take 0.5 mg by mouth 2 (two) times daily. For anxiety      . polyethylene glycol powder (GLYCOLAX/MIRALAX) powder Take 1 Container by mouth as needed.       No current facility-administered medications for this visit.     ROS:  Pertinent items are noted in HPI.  SOCIAL HISTORY:  Married.   PHYSICAL EXAMINATION:    BP 108/64  Pulse 76  Ht 5' 5.5" (1.664 m)  Wt 118 lb (53.524 kg)  BMI 19.33 kg/m2   Wt Readings from Last 3 Encounters:  01/11/13 118 lb (53.524 kg)  08/15/12 126 lb (57.153 kg)  08/08/12 123 lb 8 oz (56.019 kg)     Ht Readings from Last 3 Encounters:  01/11/13 5' 5.5" (1.664 m)  08/15/12 5' 5.75" (1.67 m)  08/08/12 5' 5.75" (1.67  m)    General appearance: alert, cooperative and appears stated age Head: Normocephalic, without obvious abnormality, atraumatic Neck: no adenopathy, supple, symmetrical, trachea midline and thyroid not enlarged, symmetric, no tenderness/mass/nodules Lungs: inspiratory wheezes in upper lobes bilaterally.  Sound more like air movement than rhonchi.  Breasts: Inspection negative, No nipple retraction or dimpling, No nipple discharge or bleeding, No axillary or supraclavicular adenopathy, Normal to palpation without dominant masses Heart: regular rate and rhythm Abdomen: soft, non-tender; no masses,  no organomegaly Extremities: extremities normal, atraumatic, no cyanosis or edema Skin: Skin color, texture, turgor normal. No rashes or lesions Lymph nodes: Cervical, supraclavicular, and axillary nodes normal. No abnormal inguinal nodes palpated Neurologic: Grossly normal  Pelvic: External genitalia:  no lesions              Urethra:  normal appearing urethra with no masses, tenderness or lesions              Bartholins and Skenes: normal                 Vagina: normal appearing vagina with normal color and discharge, no lesions              Cervix: normal appearance              Pap and high risk HPV testing done: no.            Bimanual Exam:  Uterus:  uterus is normal size, shape, consistency and nontender                                      Adnexa: normal adnexa in size, nontender and no masses                                      Rectovaginal: Confirms  Anus:  normal sphincter tone, no lesions  ASSESSMENT  Normal gynecologic exam. Osteopenia.   On Actonel.   PLAN  Mammogram at [facility].  Will schedule for patient.  Pap smear and high risk HPV testing not indicated.  Counseled on  Vit D and exercise.  Bone density in 2015.  Return annually or prn   An After Visit Summary was printed and given to the patient.  ADDENDUM - mammogram July 2014  normal at [place].            ```

## 2013-01-22 DIAGNOSIS — E559 Vitamin D deficiency, unspecified: Secondary | ICD-10-CM | POA: Diagnosis not present

## 2013-01-22 DIAGNOSIS — J309 Allergic rhinitis, unspecified: Secondary | ICD-10-CM | POA: Diagnosis not present

## 2013-01-22 DIAGNOSIS — F319 Bipolar disorder, unspecified: Secondary | ICD-10-CM | POA: Diagnosis not present

## 2013-01-22 DIAGNOSIS — N390 Urinary tract infection, site not specified: Secondary | ICD-10-CM | POA: Diagnosis not present

## 2013-01-22 DIAGNOSIS — E785 Hyperlipidemia, unspecified: Secondary | ICD-10-CM | POA: Diagnosis not present

## 2013-01-22 DIAGNOSIS — F411 Generalized anxiety disorder: Secondary | ICD-10-CM | POA: Diagnosis not present

## 2013-01-22 DIAGNOSIS — E039 Hypothyroidism, unspecified: Secondary | ICD-10-CM | POA: Diagnosis not present

## 2013-01-30 DIAGNOSIS — F3174 Bipolar disorder, in full remission, most recent episode manic: Secondary | ICD-10-CM | POA: Diagnosis not present

## 2013-02-26 DIAGNOSIS — N390 Urinary tract infection, site not specified: Secondary | ICD-10-CM | POA: Diagnosis not present

## 2013-03-14 DIAGNOSIS — F411 Generalized anxiety disorder: Secondary | ICD-10-CM | POA: Diagnosis not present

## 2013-03-14 DIAGNOSIS — R0602 Shortness of breath: Secondary | ICD-10-CM | POA: Diagnosis not present

## 2013-03-14 DIAGNOSIS — R1312 Dysphagia, oropharyngeal phase: Secondary | ICD-10-CM | POA: Diagnosis not present

## 2013-03-15 DIAGNOSIS — R05 Cough: Secondary | ICD-10-CM | POA: Diagnosis not present

## 2013-03-15 DIAGNOSIS — R059 Cough, unspecified: Secondary | ICD-10-CM | POA: Diagnosis not present

## 2013-03-21 DIAGNOSIS — F3174 Bipolar disorder, in full remission, most recent episode manic: Secondary | ICD-10-CM | POA: Diagnosis not present

## 2013-03-22 DIAGNOSIS — E039 Hypothyroidism, unspecified: Secondary | ICD-10-CM | POA: Diagnosis not present

## 2013-03-22 DIAGNOSIS — Z0000 Encounter for general adult medical examination without abnormal findings: Secondary | ICD-10-CM | POA: Diagnosis not present

## 2013-03-22 DIAGNOSIS — Z79899 Other long term (current) drug therapy: Secondary | ICD-10-CM | POA: Diagnosis not present

## 2013-03-27 ENCOUNTER — Other Ambulatory Visit: Payer: Self-pay | Admitting: Gastroenterology

## 2013-03-27 DIAGNOSIS — R1310 Dysphagia, unspecified: Secondary | ICD-10-CM

## 2013-03-29 ENCOUNTER — Ambulatory Visit
Admission: RE | Admit: 2013-03-29 | Discharge: 2013-03-29 | Disposition: A | Discharge status: Home / Self Care | Payer: Medicare Other | Source: Ambulatory Visit | Attending: Gastroenterology | Admitting: Gastroenterology

## 2013-03-29 DIAGNOSIS — R1310 Dysphagia, unspecified: Secondary | ICD-10-CM

## 2013-04-19 DIAGNOSIS — E039 Hypothyroidism, unspecified: Secondary | ICD-10-CM | POA: Diagnosis not present

## 2013-04-19 DIAGNOSIS — K219 Gastro-esophageal reflux disease without esophagitis: Secondary | ICD-10-CM | POA: Diagnosis not present

## 2013-04-19 DIAGNOSIS — R0602 Shortness of breath: Secondary | ICD-10-CM | POA: Diagnosis not present

## 2013-04-19 DIAGNOSIS — F411 Generalized anxiety disorder: Secondary | ICD-10-CM | POA: Diagnosis not present

## 2013-04-19 DIAGNOSIS — R1312 Dysphagia, oropharyngeal phase: Secondary | ICD-10-CM | POA: Diagnosis not present

## 2013-04-30 DIAGNOSIS — E119 Type 2 diabetes mellitus without complications: Secondary | ICD-10-CM | POA: Diagnosis not present

## 2013-04-30 DIAGNOSIS — E039 Hypothyroidism, unspecified: Secondary | ICD-10-CM | POA: Diagnosis not present

## 2013-04-30 DIAGNOSIS — E782 Mixed hyperlipidemia: Secondary | ICD-10-CM | POA: Diagnosis not present

## 2013-04-30 DIAGNOSIS — E878 Other disorders of electrolyte and fluid balance, not elsewhere classified: Secondary | ICD-10-CM | POA: Diagnosis not present

## 2013-05-02 DIAGNOSIS — F3174 Bipolar disorder, in full remission, most recent episode manic: Secondary | ICD-10-CM | POA: Diagnosis not present

## 2013-05-14 DIAGNOSIS — E039 Hypothyroidism, unspecified: Secondary | ICD-10-CM | POA: Diagnosis not present

## 2013-05-14 DIAGNOSIS — R0602 Shortness of breath: Secondary | ICD-10-CM | POA: Diagnosis not present

## 2013-05-14 DIAGNOSIS — F411 Generalized anxiety disorder: Secondary | ICD-10-CM | POA: Diagnosis not present

## 2013-05-14 DIAGNOSIS — R1312 Dysphagia, oropharyngeal phase: Secondary | ICD-10-CM | POA: Diagnosis not present

## 2013-05-14 DIAGNOSIS — K219 Gastro-esophageal reflux disease without esophagitis: Secondary | ICD-10-CM | POA: Diagnosis not present

## 2013-05-17 DIAGNOSIS — F411 Generalized anxiety disorder: Secondary | ICD-10-CM | POA: Diagnosis not present

## 2013-05-17 DIAGNOSIS — F3132 Bipolar disorder, current episode depressed, moderate: Secondary | ICD-10-CM | POA: Diagnosis not present

## 2013-06-11 DIAGNOSIS — F411 Generalized anxiety disorder: Secondary | ICD-10-CM | POA: Diagnosis not present

## 2013-06-11 DIAGNOSIS — K219 Gastro-esophageal reflux disease without esophagitis: Secondary | ICD-10-CM | POA: Diagnosis not present

## 2013-06-11 DIAGNOSIS — R0602 Shortness of breath: Secondary | ICD-10-CM | POA: Diagnosis not present

## 2013-06-11 DIAGNOSIS — R1312 Dysphagia, oropharyngeal phase: Secondary | ICD-10-CM | POA: Diagnosis not present

## 2013-06-11 DIAGNOSIS — F3132 Bipolar disorder, current episode depressed, moderate: Secondary | ICD-10-CM | POA: Diagnosis not present

## 2013-06-11 DIAGNOSIS — E039 Hypothyroidism, unspecified: Secondary | ICD-10-CM | POA: Diagnosis not present

## 2013-06-18 DIAGNOSIS — F3132 Bipolar disorder, current episode depressed, moderate: Secondary | ICD-10-CM | POA: Diagnosis not present

## 2013-06-18 DIAGNOSIS — F411 Generalized anxiety disorder: Secondary | ICD-10-CM | POA: Diagnosis not present

## 2013-06-20 DIAGNOSIS — F3174 Bipolar disorder, in full remission, most recent episode manic: Secondary | ICD-10-CM | POA: Diagnosis not present

## 2013-06-25 DIAGNOSIS — F411 Generalized anxiety disorder: Secondary | ICD-10-CM | POA: Diagnosis not present

## 2013-06-25 DIAGNOSIS — F3132 Bipolar disorder, current episode depressed, moderate: Secondary | ICD-10-CM | POA: Diagnosis not present

## 2013-07-02 DIAGNOSIS — F411 Generalized anxiety disorder: Secondary | ICD-10-CM | POA: Diagnosis not present

## 2013-07-02 DIAGNOSIS — F3132 Bipolar disorder, current episode depressed, moderate: Secondary | ICD-10-CM | POA: Diagnosis not present

## 2013-07-05 ENCOUNTER — Telehealth: Payer: Self-pay | Admitting: Obstetrics and Gynecology

## 2013-07-05 NOTE — Telephone Encounter (Signed)
Order for review and signature to Dr.Silva's desk.

## 2013-07-05 NOTE — Telephone Encounter (Signed)
Spoke with patient. Advised order for BMD sent to [facility] with confirmation received. Patient is agreeable and verbalizes understanding. Would like to know how much insurance will cover. Advised patient to contact [facility] as they will be able to provide her with more accurate price information for service. Patient is agreeable.  Routing to provider for final review. Patient agreeable to disposition. Will close encounter

## 2013-07-05 NOTE — Telephone Encounter (Signed)
Bone density scheduled 08/20/13 at [place]. Patient requested we fax an order please.

## 2013-07-09 DIAGNOSIS — F411 Generalized anxiety disorder: Secondary | ICD-10-CM | POA: Diagnosis not present

## 2013-07-09 DIAGNOSIS — K219 Gastro-esophageal reflux disease without esophagitis: Secondary | ICD-10-CM | POA: Diagnosis not present

## 2013-07-09 DIAGNOSIS — R609 Edema, unspecified: Secondary | ICD-10-CM | POA: Diagnosis not present

## 2013-07-09 DIAGNOSIS — R0602 Shortness of breath: Secondary | ICD-10-CM | POA: Diagnosis not present

## 2013-07-09 DIAGNOSIS — R1312 Dysphagia, oropharyngeal phase: Secondary | ICD-10-CM | POA: Diagnosis not present

## 2013-07-09 DIAGNOSIS — F3132 Bipolar disorder, current episode depressed, moderate: Secondary | ICD-10-CM | POA: Diagnosis not present

## 2013-07-09 DIAGNOSIS — E039 Hypothyroidism, unspecified: Secondary | ICD-10-CM | POA: Diagnosis not present

## 2013-07-16 DIAGNOSIS — F411 Generalized anxiety disorder: Secondary | ICD-10-CM | POA: Diagnosis not present

## 2013-07-16 DIAGNOSIS — F3132 Bipolar disorder, current episode depressed, moderate: Secondary | ICD-10-CM | POA: Diagnosis not present

## 2013-07-18 DIAGNOSIS — E673 Hypervitaminosis D: Secondary | ICD-10-CM | POA: Diagnosis not present

## 2013-07-23 DIAGNOSIS — F3132 Bipolar disorder, current episode depressed, moderate: Secondary | ICD-10-CM | POA: Diagnosis not present

## 2013-07-23 DIAGNOSIS — F411 Generalized anxiety disorder: Secondary | ICD-10-CM | POA: Diagnosis not present

## 2013-07-30 DIAGNOSIS — F3132 Bipolar disorder, current episode depressed, moderate: Secondary | ICD-10-CM | POA: Diagnosis not present

## 2013-07-30 DIAGNOSIS — F411 Generalized anxiety disorder: Secondary | ICD-10-CM | POA: Diagnosis not present

## 2013-08-06 DIAGNOSIS — Z1231 Encounter for screening mammogram for malignant neoplasm of breast: Secondary | ICD-10-CM | POA: Diagnosis not present

## 2013-08-06 DIAGNOSIS — Z803 Family history of malignant neoplasm of breast: Secondary | ICD-10-CM | POA: Diagnosis not present

## 2013-08-15 DIAGNOSIS — F3174 Bipolar disorder, in full remission, most recent episode manic: Secondary | ICD-10-CM | POA: Diagnosis not present

## 2013-08-20 DIAGNOSIS — M949 Disorder of cartilage, unspecified: Secondary | ICD-10-CM | POA: Diagnosis not present

## 2013-08-20 DIAGNOSIS — M899 Disorder of bone, unspecified: Secondary | ICD-10-CM | POA: Diagnosis not present

## 2013-09-04 ENCOUNTER — Telehealth: Payer: Self-pay

## 2013-09-04 NOTE — Telephone Encounter (Signed)
Spoke with patient. Advised of results from BMD. Advised that per Dr.Silva patient has osteopenia with increased bone loss of hips. Patient is at risk for hip fracture and needs to make an appointment to discuss. Patient states that she just returned home from assisted living and has an appointment to see Dr.Erickson on Friday. Is going to contact [facility] to see if they sent a copy to his office and if not to have a copy sent for appointment. Patient would like to call to schedule appointment with Dr.Silva after seeing Dr.Erickson if needed.   Routing to provider for final review. Patient agreeable to disposition. Will close encounter

## 2013-09-06 DIAGNOSIS — J45909 Unspecified asthma, uncomplicated: Secondary | ICD-10-CM | POA: Diagnosis not present

## 2013-09-06 DIAGNOSIS — I10 Essential (primary) hypertension: Secondary | ICD-10-CM | POA: Diagnosis not present

## 2013-09-06 DIAGNOSIS — Z1331 Encounter for screening for depression: Secondary | ICD-10-CM | POA: Diagnosis not present

## 2013-09-06 DIAGNOSIS — F411 Generalized anxiety disorder: Secondary | ICD-10-CM | POA: Diagnosis not present

## 2013-09-06 DIAGNOSIS — IMO0002 Reserved for concepts with insufficient information to code with codable children: Secondary | ICD-10-CM | POA: Diagnosis not present

## 2013-09-06 DIAGNOSIS — F319 Bipolar disorder, unspecified: Secondary | ICD-10-CM | POA: Diagnosis not present

## 2013-09-06 DIAGNOSIS — E039 Hypothyroidism, unspecified: Secondary | ICD-10-CM | POA: Diagnosis not present

## 2013-09-09 DIAGNOSIS — F3132 Bipolar disorder, current episode depressed, moderate: Secondary | ICD-10-CM | POA: Diagnosis not present

## 2013-09-25 ENCOUNTER — Encounter: Payer: Self-pay | Admitting: Obstetrics and Gynecology

## 2013-09-25 DIAGNOSIS — F3174 Bipolar disorder, in full remission, most recent episode manic: Secondary | ICD-10-CM | POA: Diagnosis not present

## 2013-09-26 ENCOUNTER — Encounter: Payer: Self-pay | Admitting: Obstetrics and Gynecology

## 2013-10-08 DIAGNOSIS — Z23 Encounter for immunization: Secondary | ICD-10-CM | POA: Diagnosis not present

## 2013-11-07 DIAGNOSIS — F3174 Bipolar disorder, in full remission, most recent episode manic: Secondary | ICD-10-CM | POA: Diagnosis not present

## 2013-11-11 ENCOUNTER — Encounter: Payer: Self-pay | Admitting: Obstetrics and Gynecology

## 2013-11-14 DIAGNOSIS — R8299 Other abnormal findings in urine: Secondary | ICD-10-CM | POA: Diagnosis not present

## 2013-11-14 DIAGNOSIS — I10 Essential (primary) hypertension: Secondary | ICD-10-CM | POA: Diagnosis not present

## 2013-11-14 DIAGNOSIS — E039 Hypothyroidism, unspecified: Secondary | ICD-10-CM | POA: Diagnosis not present

## 2013-11-14 DIAGNOSIS — E559 Vitamin D deficiency, unspecified: Secondary | ICD-10-CM | POA: Diagnosis not present

## 2013-11-14 DIAGNOSIS — F319 Bipolar disorder, unspecified: Secondary | ICD-10-CM | POA: Diagnosis not present

## 2013-11-14 DIAGNOSIS — R8290 Unspecified abnormal findings in urine: Secondary | ICD-10-CM | POA: Diagnosis not present

## 2013-11-21 DIAGNOSIS — I10 Essential (primary) hypertension: Secondary | ICD-10-CM | POA: Diagnosis not present

## 2013-11-21 DIAGNOSIS — E039 Hypothyroidism, unspecified: Secondary | ICD-10-CM | POA: Diagnosis not present

## 2013-11-21 DIAGNOSIS — Z0000 Encounter for general adult medical examination without abnormal findings: Secondary | ICD-10-CM | POA: Diagnosis not present

## 2013-11-21 DIAGNOSIS — Z681 Body mass index (BMI) 19 or less, adult: Secondary | ICD-10-CM | POA: Diagnosis not present

## 2013-11-21 DIAGNOSIS — J45909 Unspecified asthma, uncomplicated: Secondary | ICD-10-CM | POA: Diagnosis not present

## 2013-11-21 DIAGNOSIS — F319 Bipolar disorder, unspecified: Secondary | ICD-10-CM | POA: Diagnosis not present

## 2013-11-25 DIAGNOSIS — Z1212 Encounter for screening for malignant neoplasm of rectum: Secondary | ICD-10-CM | POA: Diagnosis not present

## 2013-11-28 DIAGNOSIS — Z23 Encounter for immunization: Secondary | ICD-10-CM | POA: Diagnosis not present

## 2013-12-12 DIAGNOSIS — F3174 Bipolar disorder, in full remission, most recent episode manic: Secondary | ICD-10-CM | POA: Diagnosis not present

## 2013-12-13 DIAGNOSIS — D1801 Hemangioma of skin and subcutaneous tissue: Secondary | ICD-10-CM | POA: Diagnosis not present

## 2013-12-13 DIAGNOSIS — Z86018 Personal history of other benign neoplasm: Secondary | ICD-10-CM | POA: Diagnosis not present

## 2013-12-13 DIAGNOSIS — D239 Other benign neoplasm of skin, unspecified: Secondary | ICD-10-CM | POA: Diagnosis not present

## 2013-12-13 DIAGNOSIS — H02826 Cysts of left eye, unspecified eyelid: Secondary | ICD-10-CM | POA: Diagnosis not present

## 2013-12-13 DIAGNOSIS — Z85828 Personal history of other malignant neoplasm of skin: Secondary | ICD-10-CM | POA: Diagnosis not present

## 2013-12-13 DIAGNOSIS — L814 Other melanin hyperpigmentation: Secondary | ICD-10-CM | POA: Diagnosis not present

## 2013-12-13 DIAGNOSIS — L853 Xerosis cutis: Secondary | ICD-10-CM | POA: Diagnosis not present

## 2013-12-13 DIAGNOSIS — L821 Other seborrheic keratosis: Secondary | ICD-10-CM | POA: Diagnosis not present

## 2014-01-08 DIAGNOSIS — F3174 Bipolar disorder, in full remission, most recent episode manic: Secondary | ICD-10-CM | POA: Diagnosis not present

## 2014-01-13 ENCOUNTER — Ambulatory Visit: Payer: Medicare Other | Admitting: Obstetrics and Gynecology

## 2014-01-21 ENCOUNTER — Telehealth: Payer: Self-pay | Admitting: Obstetrics and Gynecology

## 2014-01-21 ENCOUNTER — Ambulatory Visit (INDEPENDENT_AMBULATORY_CARE_PROVIDER_SITE_OTHER): Payer: Medicare Other | Admitting: Nurse Practitioner

## 2014-01-21 ENCOUNTER — Encounter: Payer: Self-pay | Admitting: Nurse Practitioner

## 2014-01-21 VITALS — BP 138/70 | HR 76 | Temp 98.7°F | Resp 18 | Ht 65.5 in | Wt 119.0 lb

## 2014-01-21 DIAGNOSIS — K649 Unspecified hemorrhoids: Secondary | ICD-10-CM | POA: Diagnosis not present

## 2014-01-21 MED ORDER — PRAMOXINE HCL 1 % RE FOAM: 1.0000 "application " | Freq: Three times a day (TID) | RECTAL | Status: DC | PRN

## 2014-01-21 NOTE — Telephone Encounter (Signed)
Spoke with patient. Advised of message as seen below from [place]. Patient is agreeable to come in for OV today. Appointment scheduled for today at 2:15pm with Milford Cage, [place]. Agreeable to date and time.  Cc: Milford Cage, FNP   Routing to provider for final review. Patient agreeable to disposition. Will close encounter

## 2014-01-21 NOTE — Patient Instructions (Signed)

## 2014-01-21 NOTE — Telephone Encounter (Signed)
Offer patient appointment for today if possible.  She may have a thrombosed hemorrhoid?

## 2014-01-21 NOTE — Telephone Encounter (Signed)
Spoke with patient. Patient states last night she had a "flare up" of her hemorrhoid. Patient used nupercainal cream with no relief. "I have not had a flare up in a long time. I have been using nupercainal for 10-20 years when I have a flare up and it always works. This time is made it worse. I barely slept last night." States area is burning, itching, and inflamed. "It is on the outside and is very irritated. My cream is new too. I just need something for relief." Patient has appointment for aex on 1/14 with Dr.Silva. Requesting something for relief until she can come in for that appointment. Advised will need to send a message over to Dr.Silva and return call with further recommendations. Patient is agreeable.

## 2014-01-21 NOTE — Telephone Encounter (Signed)
Pt having hemorrhoid flare up. Pt has a medication that usually helps but when she used it last night it made the issue worse. Has aex 01/23/14 with Dr Quincy Simmonds. Pt asks for advice until her appointment.

## 2014-01-21 NOTE — Progress Notes (Signed)
Subjective:     Patient ID: Amy [name], female   DOB: 05/24/1945, 69 y.o.   MRN: [id number]  HPI  This 69 yo G2P2 WM Fe presents with a history of a "flare up" of her hemorrhoid since last pm. Patient used Nupercainal cream with no relief.  She had a recent constipation then diarrhea incident that she thinks caused the flare.  No reported flare up in a long time. She usually uses Nupercainal for the past 10-20 years with a flare up and it always works. This time no improvement and did not sleep well last pm.   Associated burning, itching, and inflamed. She has not tried sitz bath or soaks for relief.  No rectal bleeding or abdominal pain.  Review of Systems  Constitutional: Negative for fever, chills, diaphoresis and fatigue.  Respiratory: Negative.   Cardiovascular: Negative.   Gastrointestinal: Positive for diarrhea, constipation and rectal pain. Negative for nausea, vomiting, abdominal pain, blood in stool and anal bleeding.  Genitourinary: Negative for dysuria, urgency, frequency, hematuria, flank pain, vaginal bleeding, vaginal discharge, difficulty urinating, vaginal pain and pelvic pain.  Musculoskeletal: Negative.   Skin: Negative.   Neurological: Negative.   Psychiatric/Behavioral: Negative.        Objective:   Physical Exam  Constitutional: She is oriented to person, place, and time. She appears well-developed and well-nourished. She appears distressed.  Pain with sitting.  Pulmonary/Chest: Effort normal.  Abdominal: Soft. Bowel sounds are normal. She exhibits no distension and no mass. There is no tenderness. There is no rebound and no guarding.  Genitourinary:  Anal scopic exam:  patient was placed on her left knee chest position.  Digital exam reveals 2 external hemorrhoids without thrombosis.  One area is soft and looks like it may have bled.  On internal digital exam there are no internal lesions and hemorrhoids can be pushed back in.  With the anal scope and  lubrication it is gently inserted and reveals only the 2 hemorrhoids at 3:00 and 6:30 positions without other lesions.  No BRRB is noted on the scope. Patient tolerated the procedure well.  Neurological: She is alert and oriented to person, place, and time.  Psychiatric: She has a normal mood and affect. Her behavior is normal. Judgment and thought content normal.       Assessment:     External hemorrhoid with flare  Plan:     Proctofoam HC foam tid use as directed Advised warm sitz bath wit Aveeno as she has used this in the past. If any rectal bleeding to call right away If any abdominal pain, N/V to call ASAP Call if any increase in swelling

## 2014-01-23 ENCOUNTER — Encounter: Payer: Self-pay | Admitting: Obstetrics and Gynecology

## 2014-01-23 ENCOUNTER — Ambulatory Visit (INDEPENDENT_AMBULATORY_CARE_PROVIDER_SITE_OTHER): Payer: Medicare Other | Admitting: Obstetrics and Gynecology

## 2014-01-23 ENCOUNTER — Telehealth: Payer: Self-pay | Admitting: Obstetrics and Gynecology

## 2014-01-23 VITALS — BP 144/82 | HR 70 | Resp 18 | Ht 65.5 in | Wt 120.0 lb

## 2014-01-23 DIAGNOSIS — K649 Unspecified hemorrhoids: Secondary | ICD-10-CM

## 2014-01-23 DIAGNOSIS — Z01419 Encounter for gynecological examination (general) (routine) without abnormal findings: Secondary | ICD-10-CM

## 2014-01-23 DIAGNOSIS — Z124 Encounter for screening for malignant neoplasm of cervix: Secondary | ICD-10-CM | POA: Diagnosis not present

## 2014-01-23 DIAGNOSIS — M810 Age-related osteoporosis without current pathological fracture: Secondary | ICD-10-CM

## 2014-01-23 MED ORDER — LIDOCAINE HCL 2 % EX GEL: CUTANEOUS | Status: DC

## 2014-01-23 MED ORDER — LIDOCAINE HCL 2 % EX GEL: 1.0000 "application " | CUTANEOUS | Status: DC | PRN

## 2014-01-23 NOTE — Progress Notes (Signed)
Patient scheduled for evaluation and consult at [facility]. Scheduled with Dr. Johney Maine for 02/03/14 at 1400. Tried for earlier appointment and spoke with triage nurse, this is first available.   Called Dr. Jacquiline Doe office. Unable to schedule consult, had to leave a message with [place] to return call to patient. Patient's information given. Will fax DEXA to Dr. Jacquiline Doe office.

## 2014-01-23 NOTE — Telephone Encounter (Signed)
Pt was seen earlier today. Prescribed Lidocaine gel and would like to know whether to use it internally, externally or both.  Pt best: [phone]

## 2014-01-23 NOTE — Telephone Encounter (Signed)
Spoke with patient. Advised patient lidocaine gel is to be used externally only. Patient is agreeable and verbalizes understanding.  Routing to provider for final review. Patient agreeable to disposition. Will close encounter

## 2014-01-23 NOTE — Progress Notes (Signed)
Patient ID: Amy [name], female   DOB: [birth year], 69 y.o.   MRN: [id number] 69 y.o. G2P2 Widowed CaucasianF here for annual exam.   Seen on 01/21/14 for hemorrhoid and treated with proctofoam.  Just did a lot of straining.  Was using a lot of witch hazel on wipes and thinks that may have irritated things.  Wants something for the pain.   Bone density 08/20/13 showed osteopenia (Actually looks like osteoporosis) and increased risk of fracture.  Sees Dr. Reynaldo Minium as PCP.  He treats her hyperparathyroidism as well.  Patient states she has had problems with elevated calcium in past while taking Lithium.  Dr. Clovis Pu treats her bipolar disorder.  Patient's last menstrual period was 01/10/2005 (approximate).          Sexually active: No.female partner  The current method of family planning is post menopausal status.    Exercising: Yes.    zumba. Smoker:  no  Health Maintenance: Pap:  10-17-07 wnl History of abnormal Pap:  no MMG:  08-06-13 UXN:ATFTD Colonoscopy:  12-14-11 wnl with Dr. Laurence Spates.  Patient thinks next colonoscopy due 12/2016. BMD:   08-20-13 osteopenia:[facility] TDaP:  Up to date with PCP Screening Labs:   Hb today: PCP, Urine today: PCP   reports that she has never smoked. She has never used smokeless tobacco. She reports that she does not drink alcohol or use illicit drugs.  Past Medical History  Diagnosis Date  . Hyperparathyroidism   . UTI (lower urinary tract infection)   . Anxiety   . Bipolar 1 disorder   . Thyroid disease   . Depression   . Confusion   . Cancer     hx of skin cancer  . Fibroid   . Osteopenia     Past Surgical History  Procedure Laterality Date  . Breast surgery      breast biopsy (Rt)  . Thyroid surgery  2011, 2004    Para Thyroid  . Skin cancer excision      hands and thigh  . Liposuction trunk      and placed fat tissue in vocal cords    Current Outpatient Prescriptions  Medication Sig Dispense Refill  . acetaminophen  (TYLENOL) 500 MG tablet Take 1,000 mg by mouth every 4 (four) hours as needed.    . Cholecalciferol (VITAMIN D) 400 UNITS capsule Take 400 Units by mouth daily. Takes 1/2 tablet daily    . esomeprazole (NEXIUM) 40 MG capsule Take 40 mg by mouth daily at 12 noon.    [name] levothyroxine (SYNTHROID, LEVOTHROID) 88 MCG tablet Take 88 mcg by mouth daily before breakfast.    . lithium carbonate 150 MG capsule Take 150 mg by mouth 2 (two) times daily with a meal.     . loratadine (CLARITIN) 10 MG tablet Take 10 mg by mouth daily.    [name] LORazepam (ATIVAN) 0.5 MG tablet Take 0.5 mg by mouth 3 (three) times daily. For anxiety    . mirtazapine (REMERON) 15 MG tablet Take 15 mg by mouth at bedtime.    . pramoxine (PROCTOFOAM) 1 % foam Place 1 application rectally 3 (three) times daily as needed for itching. 15 g 1  . Probiotic Product (PROBIOTIC DAILY) CAPS Take by mouth daily.    . ranitidine (ZANTAC) 150 MG tablet Take 150 mg by mouth at bedtime.     No current facility-administered medications for this visit.    Family History  Problem Relation Age of Onset  . Alzheimer's  disease Mother   . Stroke Mother   . Hypertension Mother   . Diabetes Father   . Alzheimer's disease Father   . Cancer Father     colon  . Breast cancer Sister     ROS:  Pertinent items are noted in HPI.  Otherwise, a comprehensive ROS was negative.  Exam:   BP 144/82 mmHg  Pulse 70  Resp 18  Ht 5' 5.5" (1.664 m)  Wt 120 lb (54.432 kg)  BMI 19.66 kg/m2  LMP 01/10/2005 (Approximate)     Height: 5' 5.5" (166.4 cm)  Ht Readings from Last 3 Encounters:  01/23/14 5' 5.5" (1.664 m)  01/21/14 5' 5.5" (1.664 m)  01/11/13 5' 5.5" (1.664 m)    General appearance: alert, cooperative and appears stated age.  Seems slightly agitated today. Head: Normocephalic, without obvious abnormality, atraumatic Neck: no adenopathy, supple, symmetrical, trachea midline and thyroid normal to inspection and palpation Lungs: clear to  auscultation bilaterally Breasts: normal appearance, no masses or tenderness, Inspection negative, No nipple retraction or dimpling, No nipple discharge or bleeding, No axillary or supraclavicular adenopathy Heart: regular rate and rhythm Abdomen: soft, non-tender; bowel sounds normal; no masses,  no organomegaly Extremities: extremities normal, atraumatic, no cyanosis or edema Skin: Skin color, texture, turgor normal. No rashes or lesions Lymph nodes: Cervical, supraclavicular, and axillary nodes normal. No abnormal inguinal nodes palpated Neurologic: Grossly normal   Pelvic: External genitalia:  no lesions              Urethra:  normal appearing urethra with no masses, tenderness or lesions              Bartholins and Skenes: normal                 Vagina: normal appearing vagina with normal color and discharge, no lesions              Cervix: no lesions              Pap taken: Yes.   Bimanual Exam:  Uterus:  normal size, contour, position, consistency, mobility, non-tender              Adnexa: normal adnexa and no mass, fullness, tenderness              Anus:  Large hemorrhoids.   Chaperone was present for exam.  A:  Well Woman with normal exam Osteoporosis.  Hyperparathyroidism.  Symptomatic hemorrhoids.  Family history of breast cancer.   P:   Mammogram yearly.  pap smear taken.  Will refer to general surgery for consultation for hemorrhoids.  Xylocaine 2% jelly to area tid prn.  Discussion of osteoporosis and increased risk of fracture.  Will refer back to patient's PCP regarding bone density study showing borderline osteoporosis.  [facility] may be an interesting option, but will defer to the patient's PCP as she has multiple chronic medical issues.  return annually or prn  15 additional minutes face to face time regarding osteoporosis and hemorrhoids.  Over 50% of time was spent in counseling.   After visit summary to patient.

## 2014-01-24 ENCOUNTER — Telehealth: Payer: Self-pay | Admitting: Obstetrics and Gynecology

## 2014-01-24 LAB — IPS PAP SMEAR ONLY

## 2014-01-24 NOTE — Telephone Encounter (Signed)
Message left to return call to Yer Castello at [phone].    

## 2014-01-24 NOTE — Telephone Encounter (Signed)
I agree with your recommendations regarding the Metamucil.  Yes, the patient can take the probiotics and the Metamucil.  Milk is a great nutritious beverage to help with calcium and vitamin D exposure, and thus bone health.  For an increase in calories, patient may want to try some Ensure shakes, any flavor.  This will boost her overall nutrition.  I am glad that the hemorrhoids are better!

## 2014-01-24 NOTE — Telephone Encounter (Signed)
Spoke with Amy [name]. [name] is already a patient there. She will give referral to front desk and have them contact the patient for scheduling.

## 2014-01-24 NOTE — Telephone Encounter (Signed)
Left message for Amy [name] to call back.

## 2014-01-24 NOTE — Telephone Encounter (Signed)
Patient has some quick questions for Dr.Sila's nurse. Patient last seen yesterday.

## 2014-01-24 NOTE — Telephone Encounter (Signed)
Amy [name] at Dr. Jacquiline Doe office regarding referral.

## 2014-01-24 NOTE — Telephone Encounter (Signed)
Patient returned call. Questions for Dr. Quincy Simmonds:   Would like to know if she can take both metamucil and probiotic together: Advised patient yes, she can take both.   Would like to know dosage of metamucil she should take. Advised patient to follow instructions on Metamucil bottle and use as directed by Dr. Quincy Simmonds. Patient would like to know for sure what dosage Dr. Quincy Simmonds recommends. Does Dr. Quincy Simmonds agree with One rounded teaspoon in 8 ounces of liquid two times per week?  Patient would also like to know if Dr. Quincy Simmonds has any recommendations regarding nutritional supplements that she can drink that will help with bone health.  Patient purchased boost and states "I am not a good eater" and would like to take a liquid supplement that Dr. Quincy Simmonds suggests to help.   Advised would route questions to Dr. Quincy Simmonds and return call. Patient states that the lidocaine is helping her greatly and she feels much improved and thanks Dr. Quincy Simmonds for her assistance.

## 2014-01-26 NOTE — Progress Notes (Signed)
Encounter reviewed by Dr. Brook Silva.  

## 2014-01-27 MED ORDER — LIDOCAINE HCL 2 % EX GEL
CUTANEOUS | Status: DC
Start: 2014-01-27 — End: 2015-01-29

## 2014-01-27 NOTE — Telephone Encounter (Signed)
Spoke with patient. Refill placed. Message given. Will follow up as needed. Appointment with general surgeon 02/03/14

## 2014-01-27 NOTE — Telephone Encounter (Addendum)
Spoke with patient and messages from Dr. Quincy Simmonds discussed. Patient agreeable.   Requests refill of lidocaine to [facility] on [address] to last her until appointment with General Surgery on 02/03/14. Advised needed Dr. Elza Rafter approval.   Order pended if you approve.   Addend note with correct appointment date for 02/03/14 with [facility]

## 2014-01-27 NOTE — Telephone Encounter (Signed)
I will refill only one more tube of the Xylocaine.  If she has continued pain, she needs to be seen sooner by the general surgeon or her gastroenterologist.

## 2014-02-03 DIAGNOSIS — K641 Second degree hemorrhoids: Secondary | ICD-10-CM | POA: Diagnosis not present

## 2014-02-03 DIAGNOSIS — K648 Other hemorrhoids: Secondary | ICD-10-CM | POA: Diagnosis not present

## 2014-02-05 DIAGNOSIS — F3132 Bipolar disorder, current episode depressed, moderate: Secondary | ICD-10-CM | POA: Diagnosis not present

## 2014-02-10 DIAGNOSIS — F3174 Bipolar disorder, in full remission, most recent episode manic: Secondary | ICD-10-CM | POA: Diagnosis not present

## 2014-03-06 DIAGNOSIS — J45909 Unspecified asthma, uncomplicated: Secondary | ICD-10-CM | POA: Diagnosis not present

## 2014-03-06 DIAGNOSIS — E039 Hypothyroidism, unspecified: Secondary | ICD-10-CM | POA: Diagnosis not present

## 2014-03-06 DIAGNOSIS — I10 Essential (primary) hypertension: Secondary | ICD-10-CM | POA: Diagnosis not present

## 2014-03-06 DIAGNOSIS — F419 Anxiety disorder, unspecified: Secondary | ICD-10-CM | POA: Diagnosis not present

## 2014-03-06 DIAGNOSIS — M8580 Other specified disorders of bone density and structure, unspecified site: Secondary | ICD-10-CM | POA: Diagnosis not present

## 2014-03-06 DIAGNOSIS — M5430 Sciatica, unspecified side: Secondary | ICD-10-CM | POA: Diagnosis not present

## 2014-03-06 DIAGNOSIS — M810 Age-related osteoporosis without current pathological fracture: Secondary | ICD-10-CM | POA: Diagnosis not present

## 2014-03-06 DIAGNOSIS — Z6820 Body mass index (BMI) 20.0-20.9, adult: Secondary | ICD-10-CM | POA: Diagnosis not present

## 2014-03-27 ENCOUNTER — Telehealth: Payer: Self-pay | Admitting: Obstetrics and Gynecology

## 2014-03-27 NOTE — Telephone Encounter (Signed)
Patient is calling to talk with Amy [name]. She states she went to Dr. Jacquiline Doe office and they could not read her bone density test from August 2015. They recommended she wait two years and have another bone density done then. Patient is not comfortable with this recommendation.

## 2014-03-28 NOTE — Telephone Encounter (Signed)
Spoke with patient. She would like to follow up after consulting with Dr. Reynaldo Minium. She states that Dr. Reynaldo Minium was "unable to read my bone density test and wants me to come back in two years and have it done in his office. I just don't feel comfortable with that recommendation due to Dr. Elza Rafter concern about my results. He also asked me why Dr. Quincy Simmonds was not managing this." Advised patient that she was referred back to PCP because Dr. Reynaldo Minium treats her for her hyperparathyroidism, from my understanding, patient states she is agreeable with that and that was her understanding as well.   Patient asked why Dr. Reynaldo Minium was unable to read the report. She gave him a hard copy that she had and also a copy was sent by our office. Advised patient that I am unable to speak to what another provider and can and cannot do. Advised patient if she continues with concern and plan of care that consult with Dr. Quincy Simmonds is recommended. Patient is agreeable to this and consult is scheduled.  Routing to provider for final review. Patient agreeable to disposition. Will close encounter

## 2014-03-28 NOTE — Telephone Encounter (Signed)
Message left to return call to Amy [name] at [phone].    

## 2014-04-08 DIAGNOSIS — F3174 Bipolar disorder, in full remission, most recent episode manic: Secondary | ICD-10-CM | POA: Diagnosis not present

## 2014-04-24 ENCOUNTER — Ambulatory Visit (INDEPENDENT_AMBULATORY_CARE_PROVIDER_SITE_OTHER): Payer: Medicare Other | Admitting: Obstetrics and Gynecology

## 2014-04-24 ENCOUNTER — Encounter: Payer: Self-pay | Admitting: Obstetrics and Gynecology

## 2014-04-24 VITALS — BP 130/76 | HR 60 | Ht 65.5 in | Wt 128.0 lb

## 2014-04-24 DIAGNOSIS — E039 Hypothyroidism, unspecified: Secondary | ICD-10-CM | POA: Diagnosis not present

## 2014-04-24 DIAGNOSIS — M8580 Other specified disorders of bone density and structure, unspecified site: Secondary | ICD-10-CM

## 2014-04-24 DIAGNOSIS — E213 Hyperparathyroidism, unspecified: Secondary | ICD-10-CM | POA: Diagnosis not present

## 2014-04-24 NOTE — Progress Notes (Signed)
Patient ID: Amy [name], female   DOB: 1945/04/26, 69 y.o.   MRN: [id number] GYNECOLOGY  VISIT   HPI: 69 y.o.   Married  Caucasian  female   G2P2 with Patient's last menstrual period was 01/10/2005 (approximate).   here for   Discussion of bone mineral density study.   Has hyperparathyroidism and hypothyroidism.  Bone density report 08/20/13 showing osteopenia and increased risk of fracture.  Lowest T score is - 2.6 of the left hip.  FRAX model showing risk of hip fracture 3.5% PCP declines to evaluate further and consider treatment options.   Patient is asking for another opinion and a potential new PCP.  Will continue with her psychiatrist.  GYNECOLOGIC HISTORY: Patient's last menstrual period was 01/10/2005 (approximate).        OB History    Gravida Para Term Preterm AB TAB SAB Ectopic Multiple Living   2 2        2          There are no active problems to display for this patient.   Past Medical History  Diagnosis Date  . Hyperparathyroidism   . UTI (lower urinary tract infection)   . Anxiety   . Bipolar 1 disorder   . Thyroid disease   . Depression   . Confusion   . Cancer     hx of skin cancer  . Fibroid   . Osteopenia     Past Surgical History  Procedure Laterality Date  . Breast surgery      breast biopsy (Rt)  . Thyroid surgery  2011, 2004    Para Thyroid  . Skin cancer excision      hands and thigh  . Liposuction trunk      and placed fat tissue in vocal cords    Current Outpatient Prescriptions  Medication Sig Dispense Refill  . acetaminophen (TYLENOL) 500 MG tablet Take 1,000 mg by mouth every 4 (four) hours as needed.    . cholecalciferol (VITAMIN D) 1000 UNITS tablet Take 1,000 Units by mouth daily. VITAMIN D    . esomeprazole (NEXIUM) 40 MG capsule Take 40 mg by mouth daily.     [name] estradiol (ESTRACE) 0.1 MG/GM vaginal cream Apply to labia as needed    . fluticasone (FLONASE) 50 MCG/ACT nasal spray Place 2 sprays into both nostrils  daily.    [name] levothyroxine (SYNTHROID, LEVOTHROID) 88 MCG tablet Take 88 mcg by mouth daily before breakfast.    . lidocaine (XYLOCAINE JELLY) 2 % jelly Use as needed for painful hemorrhoids three times daily. 30 mL 0  . lithium carbonate 150 MG capsule Take 150 mg by mouth 2 (two) times daily with a meal.     . loratadine (CLARITIN) 10 MG tablet Take 10 mg by mouth daily.    [name] LORazepam (ATIVAN) 0.5 MG tablet Take 0.5 mg by mouth 3 (three) times daily. For anxiety    . mirtazapine (REMERON) 7.5 MG tablet Take 7.5 mg by mouth at bedtime.    . Probiotic Product (PROBIOTIC DAILY) CAPS Take by mouth daily.    . Psyllium 48.57 % POWD Take 5 mLs by mouth 3 (three) times daily.    . ranitidine (ZANTAC) 150 MG tablet Take 150 mg by mouth at bedtime.     No current facility-administered medications for this visit.     ALLERGIES: Review of patient's allergies indicates no known allergies.  Family History  Problem Relation Age of Onset  . Alzheimer's disease Mother   .  Stroke Mother   . Hypertension Mother   . Diabetes Father   . Alzheimer's disease Father   . Cancer Father     colon  . Breast cancer Sister     History   Social History  . Marital Status: Married    Spouse Name: N/A  . Number of Children: N/A  . Years of Education: N/A   Occupational History  . Not on file.   Social History Main Topics  . Smoking status: Never Smoker   . Smokeless tobacco: Never Used  . Alcohol Use: No  . Drug Use: No  . Sexual Activity: No   Other Topics Concern  . Not on file   Social History Narrative    ROS:  Pertinent items are noted in HPI.  PHYSICAL EXAMINATION:    BP 130/76 mmHg  Pulse 60  Ht 5' 5.5" (1.664 m)  Wt 128 lb (58.06 kg)  BMI 20.97 kg/m2  LMP 01/10/2005 (Approximate)     General appearance: alert, cooperative and appears stated age   ASSESSMENT  Osteopenia, borderline osteoporosis of hip. Increased risk of fracture.  Hyperparathyroidism.  Hypothyroidism.    PLAN  Review of bone density report.  Reviewed fall/fracture reduction.  Will refer patient to endocrinology which I think is reasonable given the multitude of overlapping medical issues.   An After Visit Summary was printed and given to the patient.  _15_____ minutes face to face time of which over 50% was spent in counseling.

## 2014-05-28 DIAGNOSIS — R05 Cough: Secondary | ICD-10-CM | POA: Diagnosis not present

## 2014-05-28 DIAGNOSIS — J069 Acute upper respiratory infection, unspecified: Secondary | ICD-10-CM | POA: Diagnosis not present

## 2014-05-28 DIAGNOSIS — N390 Urinary tract infection, site not specified: Secondary | ICD-10-CM | POA: Diagnosis not present

## 2014-06-02 DIAGNOSIS — M8589 Other specified disorders of bone density and structure, multiple sites: Secondary | ICD-10-CM | POA: Diagnosis not present

## 2014-06-02 DIAGNOSIS — E210 Primary hyperparathyroidism: Secondary | ICD-10-CM | POA: Diagnosis not present

## 2014-06-02 DIAGNOSIS — E890 Postprocedural hypothyroidism: Secondary | ICD-10-CM | POA: Diagnosis not present

## 2014-07-01 DIAGNOSIS — F3174 Bipolar disorder, in full remission, most recent episode manic: Secondary | ICD-10-CM | POA: Diagnosis not present

## 2014-07-17 DIAGNOSIS — F3174 Bipolar disorder, in full remission, most recent episode manic: Secondary | ICD-10-CM | POA: Diagnosis not present

## 2014-07-18 DIAGNOSIS — Z79899 Other long term (current) drug therapy: Secondary | ICD-10-CM | POA: Diagnosis not present

## 2014-07-18 DIAGNOSIS — F3132 Bipolar disorder, current episode depressed, moderate: Secondary | ICD-10-CM | POA: Diagnosis not present

## 2014-08-02 IMAGING — RF DG ESOPHAGUS
9 of 10 series · 20 of 24 positions shown · non-contrast
Comparison: None.

CLINICAL DATA: Dysphagia.

EXAM:
ESOPHOGRAM / BARIUM SWALLOW / BARIUM TABLET STUDY
TECHNIQUE: Combined double contrast and single contrast examination performed
using effervescent crystals, thick barium liquid, and thin barium
liquid. The patient was observed with fluoroscopy swallowing a 13mm
barium sulphate tablet.
FLUOROSCOPY TIME:  0 min 42 seconds

[Series 1: run · 5 of 12 slices shown (1 of 9)]
[im 1/12]
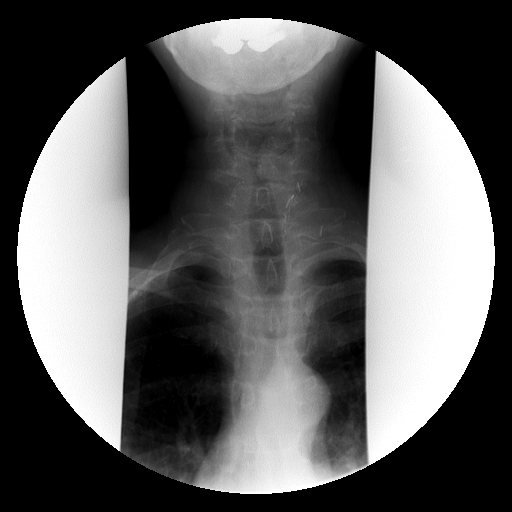
[im 3/12]
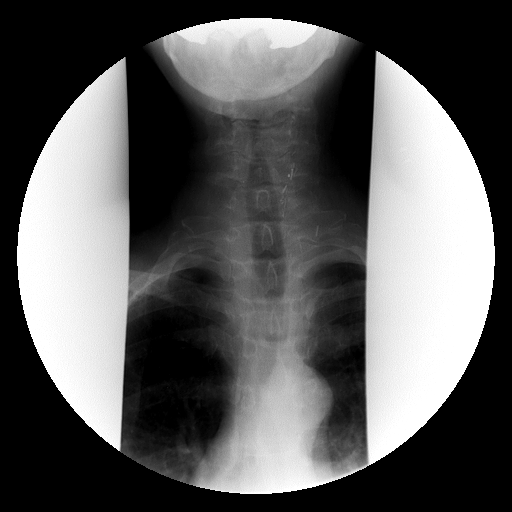
[im 7/12]
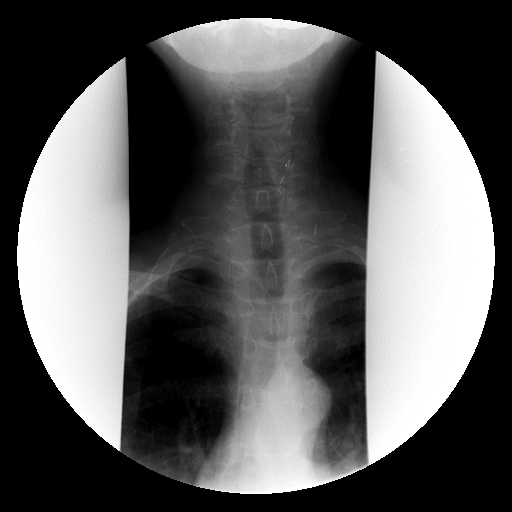
[im 9/12]
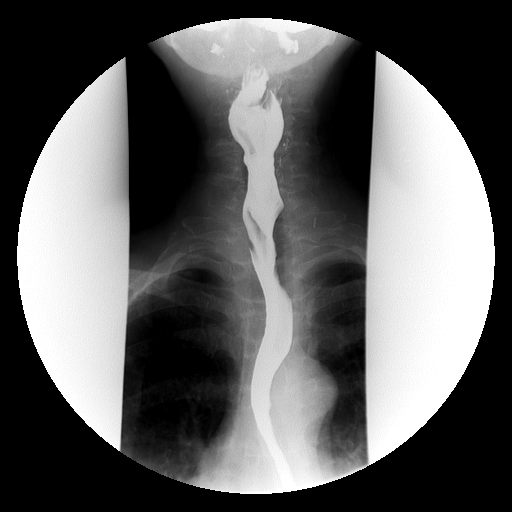
[im 12/12]
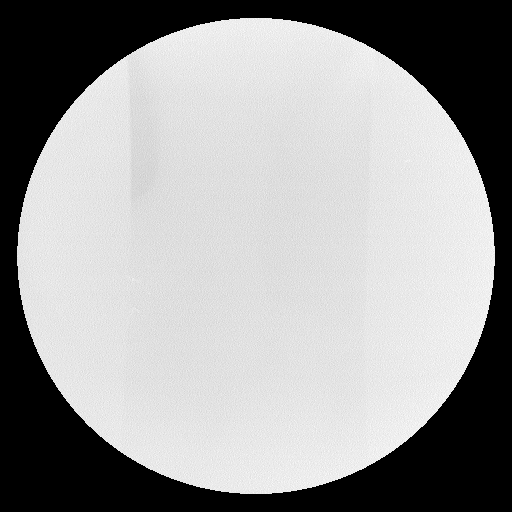

[Series 2: run · 2 of 6 slices shown (2 of 9)]
[im 1/6]
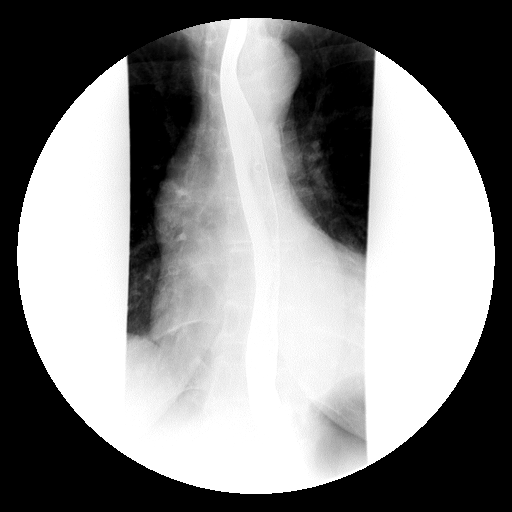
[im 6/6]
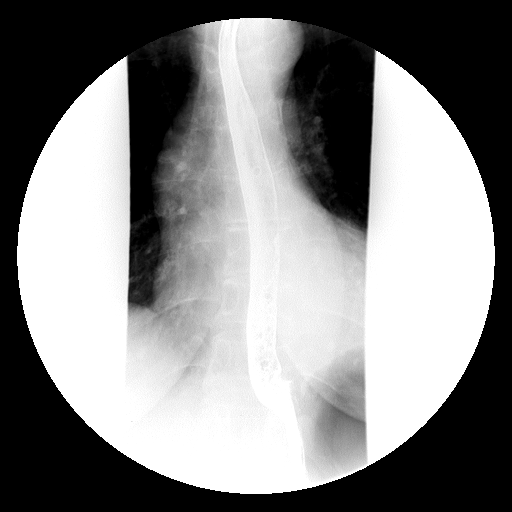

[Series 3: run · 2 of 8 slices shown (3 of 9)]
[im 4/8]
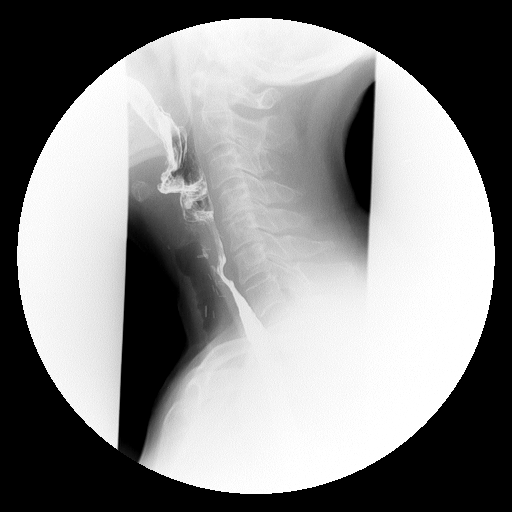
[im 8/8]
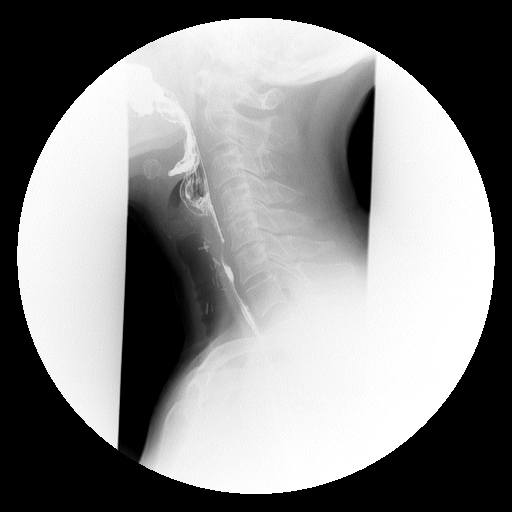

[Series 4: run · 6 of 12 slices shown (4 of 9)]
[im 1/12]
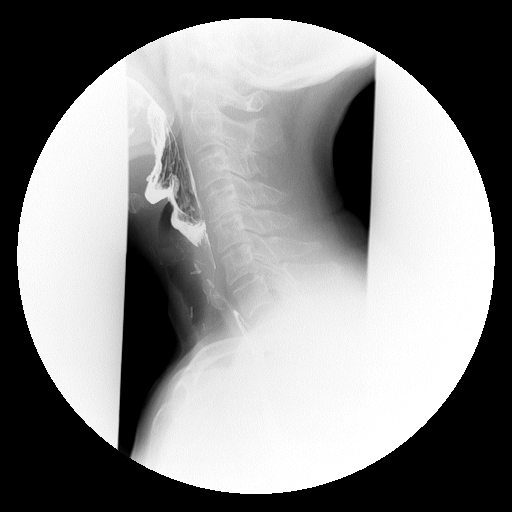
[im 2/12]
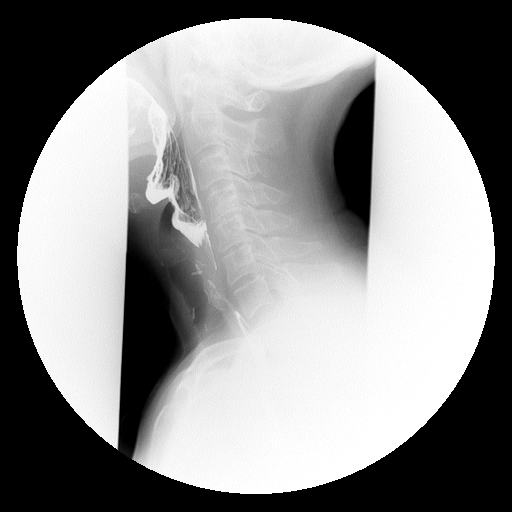
[im 4/12]
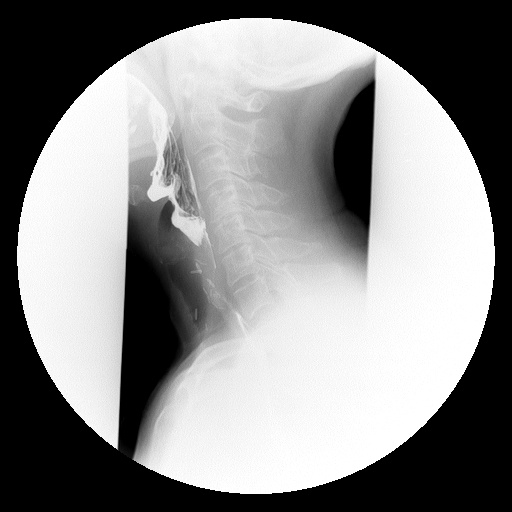
[im 8/12]
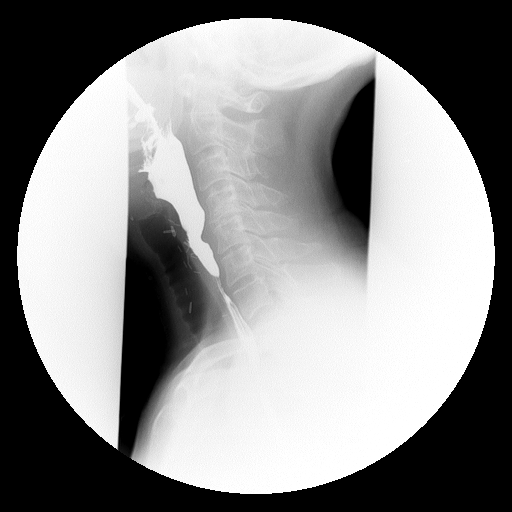
[im 10/12]
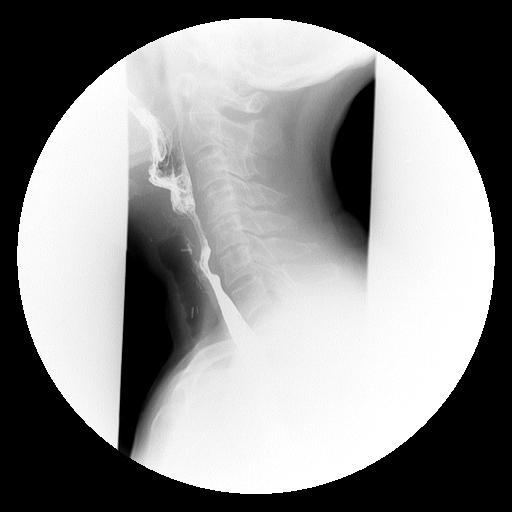
[im 12/12]
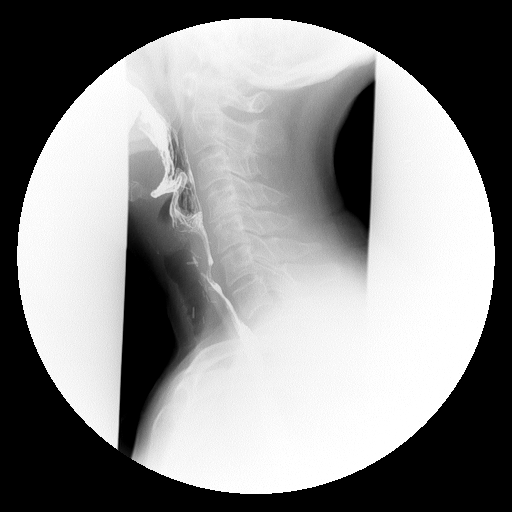

[Series 5: run · 1 of 1 slices shown (5 of 9)]
[im 1/1]
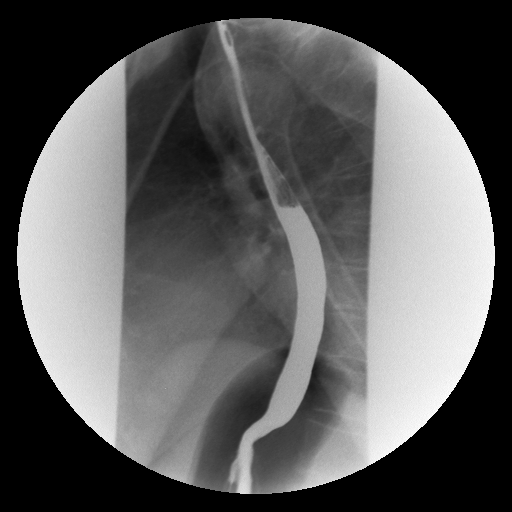

[Series 6: run · 1 of 1 slices shown (6 of 9)]
[im 1/1]
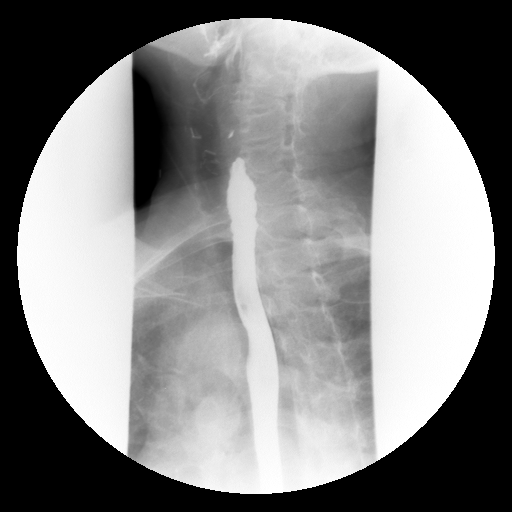

[Series 8: run · 1 of 1 slices shown (7 of 9)]
[im 1/1]
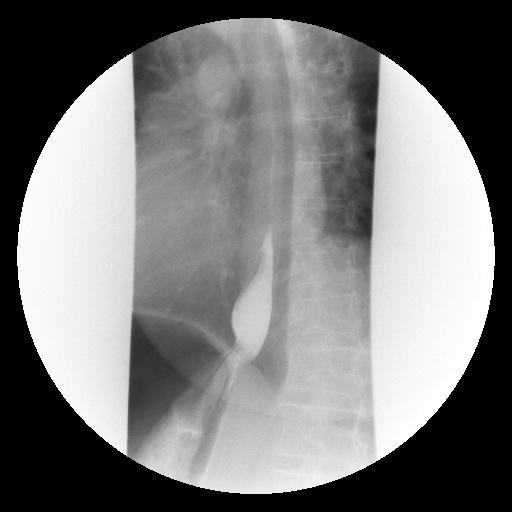

[Series 9: run · 1 of 1 slices shown (8 of 9)]
[im 1/1]
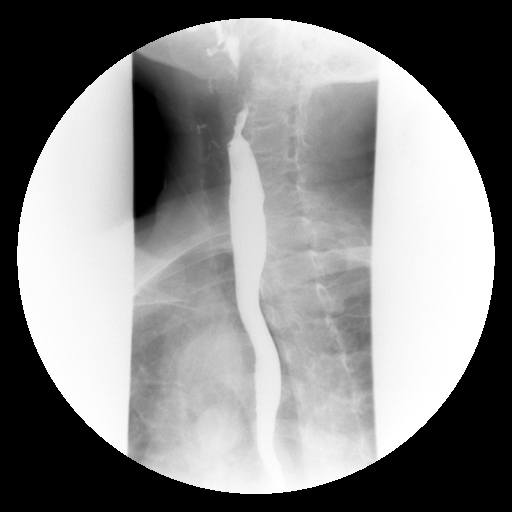

[Series 10: run · 1 of 1 slices shown (9 of 9)]
[im 1/1]
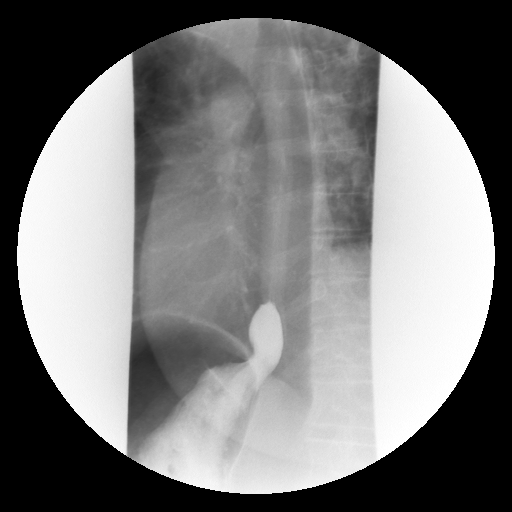

[20 of 24 positions shown; findings below may reference images not displayed]

FINDINGS: The oropharyngeal swallowing mechanisms are normal. Mucosa and
motility of the esophagus are normal. No hiatal hernia or
gastroesophageal reflux. No esophagitis. A 13 mm barium tablet
passed immediately from the mouth to the stomach with no delay.
IMPRESSION: Normal barium esophagram.

## 2014-08-19 DIAGNOSIS — Z23 Encounter for immunization: Secondary | ICD-10-CM | POA: Diagnosis not present

## 2014-08-20 ENCOUNTER — Encounter: Payer: Self-pay | Admitting: Certified Nurse Midwife

## 2014-08-20 ENCOUNTER — Ambulatory Visit (INDEPENDENT_AMBULATORY_CARE_PROVIDER_SITE_OTHER): Payer: Medicare Other | Admitting: Certified Nurse Midwife

## 2014-08-20 VITALS — BP 112/68 | HR 70 | Temp 97.5°F | Resp 16 | Ht 65.5 in | Wt 129.0 lb

## 2014-08-20 DIAGNOSIS — N390 Urinary tract infection, site not specified: Secondary | ICD-10-CM

## 2014-08-20 DIAGNOSIS — R319 Hematuria, unspecified: Secondary | ICD-10-CM | POA: Diagnosis not present

## 2014-08-20 DIAGNOSIS — N952 Postmenopausal atrophic vaginitis: Secondary | ICD-10-CM | POA: Diagnosis not present

## 2014-08-20 LAB — POCT URINALYSIS DIPSTICK
BILIRUBIN UA: NEGATIVE
Glucose, UA: NEGATIVE
KETONES UA: NEGATIVE
Nitrite, UA: NEGATIVE
Protein, UA: NEGATIVE
Urobilinogen, UA: NEGATIVE
pH, UA: 5

## 2014-08-20 MED ORDER — NITROFURANTOIN MONOHYD MACRO 100 MG PO CAPS: 100.0000 mg | ORAL_CAPSULE | Freq: Two times a day (BID) | ORAL | Status: DC

## 2014-08-20 MED ORDER — PHENAZOPYRIDINE HCL 100 MG PO TABS: 100.0000 mg | ORAL_TABLET | Freq: Two times a day (BID) | ORAL | Status: DC | PRN

## 2014-08-20 NOTE — Patient Instructions (Signed)

## 2014-08-20 NOTE — Progress Notes (Signed)
Reviewed personally.  M. Suzanne Katti Pelle, MD.  

## 2014-08-20 NOTE — Addendum Note (Signed)
Addended by: Regina Eck on: 08/20/2014 03:57 PM   Modules accepted: Orders, SmartSet

## 2014-08-20 NOTE — Progress Notes (Signed)
69 y.o.Widowed white g2p2002 here with complaint of UTI, with onset  on 24 hours. Patient complaining of urinary frequency/urgency/ and pain with urination. Patient denies fever, chills, nausea or back pain. No new personal products. Patient not sexually active. Denies any vaginal symptoms, slight dryness. Menopausal with vaginal dryness. Patient consuming adequate water intake. Patient shared she masturbated for the first time last pm and feels this may have made things worse. Having some vaginal soreness. No sexual devise use, hand only. Patient describes urine smell of eggs, but had eggs for evening meal. History of known kidney stones, no issues with. Saw [facility] for.Last UTI 2014, treated with Macrobid which worked well. No other health issues today.   O: Healthy female WDWN Affect: Normal, orientation x 3 Skin : warm and dry CVAT: negative bilateral Abdomen: positive for suprapubic tenderness, soft  Pelvic exam: External genital area: atrophic no lesions, no exudate Bladder,Urethra tender, Urethral meatus: tender, red Vagina: scant vaginal discharge, normal appearance  Wet prep not taken Cervix: normal, non tender Uterus:normal,non tender Adnexa: normal non tender, no fullness or masses   A: UTI Normal pelvic exam Atrophic vaginitis poct urine-rbc tr, wbc 2+, smells like eggs  P: Reviewed findings of UTI and need for treatment. Discussed vaginal dryness can also cause increase in UTI occurrence. Has stopped Estrace cream, was not using. Prefer other option. ES:[id number] see order with instructions Rx Pyridium see order TMY:[id number] micro, culture Discussed vaginal dryness treatment with OTC use of Coconut oil or Olive oil, patient will try coconut oil daily. Discussed use also with self stimulation, to prevent trauma to area.Questions addressed regarding this. Reviewed warning signs and symptoms of UTI and need to advise if occurring. Encouraged to limit soda, tea, and coffee  and increase water intake. Questions addressed.  RV prn

## 2014-08-21 LAB — URINALYSIS, MICROSCOPIC ONLY
Bacteria, UA: NONE SEEN [HPF]
CASTS: NONE SEEN [LPF]
Crystals: NONE SEEN [HPF]
YEAST: NONE SEEN [HPF]

## 2014-08-23 LAB — URINE CULTURE

## 2014-08-29 ENCOUNTER — Telehealth: Payer: Self-pay | Admitting: Certified Nurse Midwife

## 2014-08-29 MED ORDER — CIPROFLOXACIN HCL 250 MG PO TABS: 250.0000 mg | ORAL_TABLET | Freq: Two times a day (BID) | ORAL | Status: DC

## 2014-08-29 NOTE — Telephone Encounter (Signed)
Patient was seen recently and is having lower back pain again. Patient is asking if she could get a refill of "nitro" pills? Last seen 08/20/14. Confirmed pharmacy with patient.

## 2014-08-29 NOTE — Telephone Encounter (Signed)
Spoke with patient. She states that she completed the Macrobid and was doing well until completing treatment. She started having urinary urgency and frequency again as soon as she stopped the Macrobid. Denies fevers, chills, nausea or vomiting. She denies any trauma to her back but states " I am just sitting around, maybe that is why my back is hurting me."   Advised reviewed her complaints with Melvia Heaps CNM and orders received for Cipro 250 mg bid for three days. Patient to call on Monday with follow up and update.  Patient is offered appointment for follow up and would like to do so on Tuesday, since she will complete her treatment with Cipro on Monday. Advised patient to call back on Monday with update regardless. If does not want to keep appointment on Tuesday can cancel at this time.  Instructions given to patient to call back or seek care at urgent care or ER with any concerning or worsening symptoms.  Patient verbalized understanding.  Order sent to pharmacy. Patient scheduled for follow up. Patient will call on Monday with update or seek care over the weekend with any concerns. Routing to provider for final review. Patient agreeable to disposition. Will close encounter.

## 2014-09-01 ENCOUNTER — Telehealth: Payer: Self-pay | Admitting: Emergency Medicine

## 2014-09-01 DIAGNOSIS — E210 Primary hyperparathyroidism: Secondary | ICD-10-CM | POA: Diagnosis not present

## 2014-09-01 DIAGNOSIS — E890 Postprocedural hypothyroidism: Secondary | ICD-10-CM | POA: Diagnosis not present

## 2014-09-01 NOTE — Telephone Encounter (Signed)
Patient calls this morning with update. She has taken her last Cipro tablet. She states she feels much improved. Reports continued lower back pain but states she is going to the chiropractor today "because this is something I have dealt with a for a long time."   She denies any urinary concerns, no frequency, no dysuria, no hematuria. Denies fevers or chills.  She requests to cancel appointment for tomorrow to follow up. She is advised to return call if symptoms return or for any further concerns. Patient agreeable.   Routing to provider for final review. Patient agreeable to disposition. Will close encounter.

## 2014-09-02 ENCOUNTER — Ambulatory Visit: Payer: Medicare Other | Admitting: Certified Nurse Midwife

## 2014-09-04 DIAGNOSIS — E890 Postprocedural hypothyroidism: Secondary | ICD-10-CM | POA: Diagnosis not present

## 2014-09-04 DIAGNOSIS — E210 Primary hyperparathyroidism: Secondary | ICD-10-CM | POA: Diagnosis not present

## 2014-09-04 DIAGNOSIS — M8589 Other specified disorders of bone density and structure, multiple sites: Secondary | ICD-10-CM | POA: Diagnosis not present

## 2014-10-02 DIAGNOSIS — F3174 Bipolar disorder, in full remission, most recent episode manic: Secondary | ICD-10-CM | POA: Diagnosis not present

## 2014-10-15 ENCOUNTER — Telehealth: Payer: Self-pay | Admitting: Obstetrics and Gynecology

## 2014-10-15 NOTE — Telephone Encounter (Signed)
Spoke with patient. Patient states that she called [facility] to schedule a mammogram and she was scheduled for a 3D diagnostic mammogram. Per patient no breast problems. Will need an order from our office per patient. Spoke with Anese at [facility] who states that patient will not need an order as she is having a standard 3D screening mammogram not a diagnostic. Anese will call patient to discuss this with her to make sure she is properly scheduled.  Routing to provider for final review. Patient agreeable to disposition. Will close encounter.

## 2014-10-15 NOTE — Telephone Encounter (Signed)
Patient says she need a referral faxed to [facility] for a 3d diagnostic mammogram.

## 2014-10-15 NOTE — Telephone Encounter (Signed)
Encounter closed in error.

## 2014-10-15 NOTE — Telephone Encounter (Signed)
Please contact [facility] to help [place] have some orientation about this.  I am not certain if the diagnostic mammogram will be done in this circumstance unless the patient is being followed for a problem or under evaluation for a breast problem.

## 2014-10-15 NOTE — Telephone Encounter (Signed)
Previously spoke with patient regarding mammogram today. Denies any current breast problems. Please see telephone note from earlier today 10/5. Patient is requesting diagnostic imaging per Anese from [facility] because her sister was recently diagnosed with breast cancer. Routing to Dr.Silva for review.

## 2014-10-15 NOTE — Telephone Encounter (Signed)
Anese from [facility] calling to have an order for a diagnostic mammogram faxed for patient because she has a sister that was recently diagnosed with breast cancer and would like to have diagnostic done.

## 2014-10-16 NOTE — Telephone Encounter (Signed)
Spoke with [facility] who states that they are unable to perform a diagnostic mammogram without the patient having an active problem. Spoke with the patient. Advised of message from [place]. Patient is very concerned because her twin sister died from breast cancer. States Dr.Bertrand said she always needs to have a diagnostic mammogram. Advised I will speak with [facility] again regarding this to clarify and return call. Spoke with Anderson Malta the patient coordinator with [facility] who states the patient needs to have a 3D screening first if she is not having any active problems. She can be scheduled for a priority read appointment so that she can have the reading performed the same day. If anything additional is needed per Anderson Malta they will do this in the same day. Patient's appointment changed to have a priority reading. Spoke with patient. Notified of all information provided by [facility]. Patient is agreeable to this plan.  Routing to provider for final review. Patient agreeable to disposition. Will close encounter.

## 2014-10-29 DIAGNOSIS — N63 Unspecified lump in breast: Secondary | ICD-10-CM | POA: Diagnosis not present

## 2014-10-29 DIAGNOSIS — Z1231 Encounter for screening mammogram for malignant neoplasm of breast: Secondary | ICD-10-CM | POA: Diagnosis not present

## 2014-10-30 DIAGNOSIS — N63 Unspecified lump in breast: Secondary | ICD-10-CM | POA: Diagnosis not present

## 2014-10-30 DIAGNOSIS — Z1231 Encounter for screening mammogram for malignant neoplasm of breast: Secondary | ICD-10-CM | POA: Diagnosis not present

## 2014-11-06 ENCOUNTER — Other Ambulatory Visit: Payer: Self-pay | Admitting: Radiology

## 2014-11-06 DIAGNOSIS — Z1231 Encounter for screening mammogram for malignant neoplasm of breast: Secondary | ICD-10-CM | POA: Diagnosis not present

## 2014-11-06 DIAGNOSIS — N6489 Other specified disorders of breast: Secondary | ICD-10-CM | POA: Diagnosis not present

## 2014-11-06 DIAGNOSIS — N63 Unspecified lump in breast: Secondary | ICD-10-CM | POA: Diagnosis not present

## 2014-11-21 DIAGNOSIS — M810 Age-related osteoporosis without current pathological fracture: Secondary | ICD-10-CM | POA: Diagnosis not present

## 2014-11-21 DIAGNOSIS — I10 Essential (primary) hypertension: Secondary | ICD-10-CM | POA: Diagnosis not present

## 2014-11-21 DIAGNOSIS — F319 Bipolar disorder, unspecified: Secondary | ICD-10-CM | POA: Diagnosis not present

## 2014-11-21 DIAGNOSIS — E039 Hypothyroidism, unspecified: Secondary | ICD-10-CM | POA: Diagnosis not present

## 2014-11-27 DIAGNOSIS — F3174 Bipolar disorder, in full remission, most recent episode manic: Secondary | ICD-10-CM | POA: Diagnosis not present

## 2014-11-28 DIAGNOSIS — J45909 Unspecified asthma, uncomplicated: Secondary | ICD-10-CM | POA: Diagnosis not present

## 2014-11-28 DIAGNOSIS — F419 Anxiety disorder, unspecified: Secondary | ICD-10-CM | POA: Diagnosis not present

## 2014-11-28 DIAGNOSIS — I10 Essential (primary) hypertension: Secondary | ICD-10-CM | POA: Diagnosis not present

## 2014-11-28 DIAGNOSIS — Z0000 Encounter for general adult medical examination without abnormal findings: Secondary | ICD-10-CM | POA: Diagnosis not present

## 2014-11-28 DIAGNOSIS — Z23 Encounter for immunization: Secondary | ICD-10-CM | POA: Diagnosis not present

## 2014-11-28 DIAGNOSIS — E039 Hypothyroidism, unspecified: Secondary | ICD-10-CM | POA: Diagnosis not present

## 2014-11-28 DIAGNOSIS — F319 Bipolar disorder, unspecified: Secondary | ICD-10-CM | POA: Diagnosis not present

## 2014-11-28 DIAGNOSIS — M810 Age-related osteoporosis without current pathological fracture: Secondary | ICD-10-CM | POA: Diagnosis not present

## 2014-11-28 DIAGNOSIS — M859 Disorder of bone density and structure, unspecified: Secondary | ICD-10-CM | POA: Diagnosis not present

## 2014-12-09 DIAGNOSIS — M79675 Pain in left toe(s): Secondary | ICD-10-CM | POA: Diagnosis not present

## 2014-12-10 DIAGNOSIS — Z1212 Encounter for screening for malignant neoplasm of rectum: Secondary | ICD-10-CM | POA: Diagnosis not present

## 2014-12-11 DIAGNOSIS — E210 Primary hyperparathyroidism: Secondary | ICD-10-CM | POA: Diagnosis not present

## 2014-12-11 DIAGNOSIS — E890 Postprocedural hypothyroidism: Secondary | ICD-10-CM | POA: Diagnosis not present

## 2014-12-11 DIAGNOSIS — M8589 Other specified disorders of bone density and structure, multiple sites: Secondary | ICD-10-CM | POA: Diagnosis not present

## 2014-12-17 DIAGNOSIS — H2513 Age-related nuclear cataract, bilateral: Secondary | ICD-10-CM | POA: Diagnosis not present

## 2014-12-18 DIAGNOSIS — D692 Other nonthrombocytopenic purpura: Secondary | ICD-10-CM | POA: Diagnosis not present

## 2014-12-18 DIAGNOSIS — L853 Xerosis cutis: Secondary | ICD-10-CM | POA: Diagnosis not present

## 2014-12-18 DIAGNOSIS — L570 Actinic keratosis: Secondary | ICD-10-CM | POA: Diagnosis not present

## 2014-12-18 DIAGNOSIS — Z85828 Personal history of other malignant neoplasm of skin: Secondary | ICD-10-CM | POA: Diagnosis not present

## 2014-12-18 DIAGNOSIS — L821 Other seborrheic keratosis: Secondary | ICD-10-CM | POA: Diagnosis not present

## 2014-12-18 DIAGNOSIS — Z23 Encounter for immunization: Secondary | ICD-10-CM | POA: Diagnosis not present

## 2014-12-18 DIAGNOSIS — Z86018 Personal history of other benign neoplasm: Secondary | ICD-10-CM | POA: Diagnosis not present

## 2014-12-18 DIAGNOSIS — D1800 Hemangioma unspecified site: Secondary | ICD-10-CM | POA: Diagnosis not present

## 2014-12-18 DIAGNOSIS — L814 Other melanin hyperpigmentation: Secondary | ICD-10-CM | POA: Diagnosis not present

## 2015-01-29 ENCOUNTER — Ambulatory Visit (INDEPENDENT_AMBULATORY_CARE_PROVIDER_SITE_OTHER): Payer: Medicare Other | Admitting: Obstetrics and Gynecology

## 2015-01-29 ENCOUNTER — Ambulatory Visit: Payer: Medicare Other | Admitting: Obstetrics and Gynecology

## 2015-01-29 ENCOUNTER — Encounter: Payer: Self-pay | Admitting: Obstetrics and Gynecology

## 2015-01-29 VITALS — BP 130/76 | HR 84 | Resp 20 | Ht 66.0 in | Wt 133.8 lb

## 2015-01-29 DIAGNOSIS — N3941 Urge incontinence: Secondary | ICD-10-CM

## 2015-01-29 DIAGNOSIS — Z124 Encounter for screening for malignant neoplasm of cervix: Secondary | ICD-10-CM

## 2015-01-29 DIAGNOSIS — Z01419 Encounter for gynecological examination (general) (routine) without abnormal findings: Secondary | ICD-10-CM

## 2015-01-29 MED ORDER — LIDOCAINE HCL 2 % EX GEL: CUTANEOUS | Status: DC

## 2015-01-29 NOTE — Progress Notes (Signed)
Patient ID: Amy [name], female   DOB: 11-Apr-1945, 70 y.o.   MRN: [id number] 70 y.o. G2P2 Widowed Caucasian female here for annual exam.    No vaginal bleeding or spotting.  Not using Estrace vaginal cream.  Used it only once in 6 months.   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  This is essentially only time she leaks. Using pads.  No leak with cough or sneeze.  Caffeine free tea.  One Coke per day.  Declines medication.  Had hemorrhoidal banding by Dr. Johney Maine, surgeon. Using Metamucil and probiotic for hemorrhoids.  Asking for Lidocaine gel for hemorrhoid pain, just in case.  Seeing Dr. Elyse Hsu for hyperparathyroidism and osteopenia.  Dizziness last week.  This resolved.  She wonders if it were vertigo.   PCP:  Burnard Bunting, MD  Patient's last menstrual period was 01/10/2005 (approximate).          Sexually active: No.female  The current method of family planning is post menopausal status.    Exercising: Yes.    aerobics at [facility] 3x/week. Smoker:  no  Health Maintenance: Pap:  01-23-14 Neg History of abnormal Pap:  no MMG:  10-30-14 Density Cat.C/mass left breast,Rt. breast neg.;U/S guided bx was benign and patient to return for Lt.mmg.and ultrasound Jan.2017:[facility].  Due for mammogram follow up with [facility] for left breast.  Has appointment for next week.  Colonoscopy:  12/2011 normal with Dr. Alferd Apa GI);next due 12/2016. BMD:   08-20-13  Result  Osteopenia.  Sees Dr. Elyse Hsu. TDaP:  2016 Screening Labs:  Hb today: PCP, Urine today: PCP   reports that she has never smoked. She has never used smokeless tobacco. She reports that she does not drink alcohol or use illicit drugs.  Past Medical History  Diagnosis Date  . Hyperparathyroidism   . UTI (lower urinary tract infection)   . Anxiety   . Bipolar 1 disorder ([place])   . Thyroid disease   . Depression   . Confusion   . Cancer (HCC)     hx of skin cancer  . Fibroid   . Osteopenia     Past Surgical History   Procedure Laterality Date  . Breast surgery      breast biopsy (Rt)  . Thyroid surgery  2011, 2004    Para Thyroid  . Skin cancer excision      hands and thigh  . Liposuction trunk      and placed fat tissue in vocal cords  . Hysteroscopy w/d&c  2006    postmenopausal bleeding, endometrial polyp    Current Outpatient Prescriptions  Medication Sig Dispense Refill  . acetaminophen (TYLENOL) 500 MG tablet Take 1,000 mg by mouth every 4 (four) hours as needed.    . Calcium Citrate (CITRACAL PO) Take by mouth daily.    . cholecalciferol (VITAMIN D) 1000 UNITS tablet Take 1,000 Units by mouth daily. VITAMIN D    . ciprofloxacin (CIPRO) 250 MG tablet Take 1 tablet (250 mg total) by mouth 2 (two) times daily. 6 tablet 0  . esomeprazole (NEXIUM) 40 MG capsule Take 40 mg by mouth daily.     [name] estradiol (ESTRACE) 0.1 MG/GM vaginal cream Apply to labia as needed    . fexofenadine (ALLEGRA) 180 MG tablet Take 180 mg by mouth daily.    . fluticasone (FLONASE) 50 MCG/ACT nasal spray Place 2 sprays into both nostrils daily.    [name] levothyroxine (SYNTHROID, LEVOTHROID) 88 MCG tablet Take 88 mcg by mouth daily before breakfast.    .  lidocaine (XYLOCAINE) 2 % jelly Use as needed for painful hemorrhoids three times daily. 30 mL 0  . lithium carbonate 150 MG capsule Take 150 mg by mouth 2 (two) times daily with a meal.     . loratadine (CLARITIN) 10 MG tablet Take 10 mg by mouth daily.    [name] LORazepam (ATIVAN) 0.5 MG tablet Take 0.5 mg by mouth 3 (three) times daily. For anxiety    . mirtazapine (REMERON) 7.5 MG tablet Take 7.5 mg by mouth at bedtime.    . Probiotic Product (PROBIOTIC DAILY) CAPS Take by mouth daily.    . ranitidine (ZANTAC) 150 MG tablet Take 150 mg by mouth at bedtime.     No current facility-administered medications for this visit.    Family History  Problem Relation Age of Onset  . Alzheimer's disease Mother   . Stroke Mother   . Hypertension Mother   . Diabetes Father   .  Alzheimer's disease Father   . Cancer Father     colon  . Breast cancer Sister     ROS:  Pertinent items are noted in HPI.  Otherwise, a comprehensive ROS was negative.  Exam:   BP 130/76 mmHg  Pulse 84  Resp 20  Ht 5\' 6"  (1.676 m)  Wt 133 lb 12.8 oz (60.691 kg)  BMI 21.61 kg/m2  LMP 01/10/2005 (Approximate)    General appearance: alert, cooperative and appears stated age Head: Normocephalic, without obvious abnormality, atraumatic Neck: no adenopathy, supple, symmetrical, trachea midline and thyroid normal to inspection and palpation Lungs: clear to auscultation bilaterally Breasts: normal appearance, no masses or tenderness, Inspection negative, No nipple retraction or dimpling, No nipple discharge or bleeding, No axillary or supraclavicular adenopathy Heart: regular rate and rhythm Abdomen: soft, non-tender; bowel sounds normal; no masses,  no organomegaly Extremities: extremities normal, atraumatic, no cyanosis or edema Skin: Skin color, texture, turgor normal. No rashes or lesions Lymph nodes: Cervical, supraclavicular, and axillary nodes normal. No abnormal inguinal nodes palpated Neurologic: Grossly normal  Pelvic: External genitalia:  no lesions              Urethra:  normal appearing urethra with no masses, tenderness or lesions              Bartholins and Skenes: normal                 Vagina: normal appearing vagina with normal color and discharge, no lesions              Cervix: no lesions              Pap taken: No. Bimanual Exam:  Uterus:  normal size, contour, position, consistency, mobility, non-tender              Adnexa: normal adnexa and no mass, fullness, tenderness              Rectovaginal: Yes.  .  Confirms.              Anus:  normal sphincter tone, no lesions  Chaperone was present for exam.  Assessment:   Well woman visit with normal exam. Left breast mass.  Benign biopsy.  FH of twin sister with breast cancer.  FH of colon cancer.  Osteopenia.   Hyperparathyroidism.  Urge incontinence. Status post hemorrhoidal banding.   Plan: Yearly mammogram recommended after age 37.  Due for follow up mammogram of left breast at [facility] next week.  Recommended self breast exam.  Pap  and HR HPV as above. Discussed Calcium, Vitamin D, regular exercise program including cardiovascular and weight bearing exercise. Labs performed.  No..    Refills given on medications.   Yes.  Xylocaine jelly.  Referral to Ileana Roup at [facility].  Follow up annually and prn.    After visit summary provided.

## 2015-01-29 NOTE — Patient Instructions (Signed)
Consider [employer] for price shopping for prescription medication.    EXERCISE AND DIET:  We recommended that you start or continue a regular exercise program for good health. Regular exercise means any activity that makes your heart beat faster and makes you sweat.  We recommend exercising at least 30 minutes per day at least 3 days a week, preferably 4 or 5.  We also recommend a diet low in fat and sugar.  Inactivity, poor dietary choices and obesity can cause diabetes, heart attack, stroke, and kidney damage, among others.    ALCOHOL AND SMOKING:  Women should limit their alcohol intake to no more than 7 drinks/beers/glasses of wine (combined, not each!) per week. Moderation of alcohol intake to this level decreases your risk of breast cancer and liver damage. And of course, no recreational drugs are part of a healthy lifestyle.  And absolutely no smoking or even second hand smoke. Most people know smoking can cause heart and lung diseases, but did you know it also contributes to weakening of your bones? Aging of your skin?  Yellowing of your teeth and nails?  CALCIUM AND VITAMIN D:  Adequate intake of calcium and Vitamin D are recommended.  The recommendations for exact amounts of these supplements seem to change often, but generally speaking 600 mg of calcium (either carbonate or citrate) and 800 units of Vitamin D per day seems prudent. Certain women may benefit from higher intake of Vitamin D.  If you are among these women, your doctor will have told you during your visit.    PAP SMEARS:  Pap smears, to check for cervical cancer or precancers,  have traditionally been done yearly, although recent scientific advances have shown that most women can have pap smears less often.  However, every woman still should have a physical exam from her gynecologist every year. It will include a breast check, inspection of the vulva and vagina to check for abnormal growths or skin changes, a visual exam of the  cervix, and then an exam to evaluate the size and shape of the uterus and ovaries.  And after 70 years of age, a rectal exam is indicated to check for rectal cancers. We will also provide age appropriate advice regarding health maintenance, like when you should have certain vaccines, screening for sexually transmitted diseases, bone density testing, colonoscopy, mammograms, etc.   MAMMOGRAMS:  All women over 74 years old should have a yearly mammogram. Many facilities now offer a "3D" mammogram, which may cost around $50 extra out of pocket. If possible,  we recommend you accept the option to have the 3D mammogram performed.  It both reduces the number of women who will be called back for extra views which then turn out to be normal, and it is better than the routine mammogram at detecting truly abnormal areas.    COLONOSCOPY:  Colonoscopy to screen for colon cancer is recommended for all women at age 40.  We know, you hate the idea of the prep.  We agree, BUT, having colon cancer and not knowing it is worse!!  Colon cancer so often starts as a polyp that can be seen and removed at colonscopy, which can quite literally save your life!  And if your first colonoscopy is normal and you have no family history of colon cancer, most women don't have to have it again for 10 years.  Once every ten years, you can do something that may end up saving your life, right?  We will be  happy to help you get it scheduled when you are ready.  Be sure to check your insurance coverage so you understand how much it will cost.  It may be covered as a preventative service at no cost, but you should check your particular policy.

## 2015-02-04 DIAGNOSIS — Z6821 Body mass index (BMI) 21.0-21.9, adult: Secondary | ICD-10-CM | POA: Diagnosis not present

## 2015-02-04 DIAGNOSIS — H8110 Benign paroxysmal vertigo, unspecified ear: Secondary | ICD-10-CM | POA: Diagnosis not present

## 2015-02-04 DIAGNOSIS — I10 Essential (primary) hypertension: Secondary | ICD-10-CM | POA: Diagnosis not present

## 2015-02-05 DIAGNOSIS — R928 Other abnormal and inconclusive findings on diagnostic imaging of breast: Secondary | ICD-10-CM | POA: Diagnosis not present

## 2015-02-09 ENCOUNTER — Telehealth: Payer: Self-pay | Admitting: Obstetrics and Gynecology

## 2015-02-09 NOTE — Telephone Encounter (Signed)
Left voicemail regarding referral appointment in order to convey appointment information

## 2015-02-10 NOTE — Telephone Encounter (Signed)
Spoke with patient and conveyed appointment information for a referral to a specialist. Patient is agreeable, ok to close

## 2015-02-12 DIAGNOSIS — R278 Other lack of coordination: Secondary | ICD-10-CM | POA: Diagnosis not present

## 2015-02-12 DIAGNOSIS — N3941 Urge incontinence: Secondary | ICD-10-CM | POA: Diagnosis not present

## 2015-02-12 DIAGNOSIS — Z79899 Other long term (current) drug therapy: Secondary | ICD-10-CM | POA: Diagnosis not present

## 2015-02-12 DIAGNOSIS — F3132 Bipolar disorder, current episode depressed, moderate: Secondary | ICD-10-CM | POA: Diagnosis not present

## 2015-02-19 DIAGNOSIS — F3174 Bipolar disorder, in full remission, most recent episode manic: Secondary | ICD-10-CM | POA: Diagnosis not present

## 2015-02-20 ENCOUNTER — Telehealth: Payer: Self-pay | Admitting: Obstetrics and Gynecology

## 2015-02-20 NOTE — Telephone Encounter (Signed)
Left message for patient to return call regarding her question about medical records from august 2000.

## 2015-02-23 NOTE — Telephone Encounter (Signed)
Spoke with patient and gave her the information from chart that Verline Lema found in her chart from surgery in 2006.

## 2015-02-25 DIAGNOSIS — R278 Other lack of coordination: Secondary | ICD-10-CM | POA: Diagnosis not present

## 2015-02-25 DIAGNOSIS — M6281 Muscle weakness (generalized): Secondary | ICD-10-CM | POA: Diagnosis not present

## 2015-02-25 DIAGNOSIS — N3941 Urge incontinence: Secondary | ICD-10-CM | POA: Diagnosis not present

## 2015-02-25 DIAGNOSIS — M62838 Other muscle spasm: Secondary | ICD-10-CM | POA: Diagnosis not present

## 2015-03-05 DIAGNOSIS — F3132 Bipolar disorder, current episode depressed, moderate: Secondary | ICD-10-CM | POA: Diagnosis not present

## 2015-03-05 DIAGNOSIS — Z79899 Other long term (current) drug therapy: Secondary | ICD-10-CM | POA: Diagnosis not present

## 2015-03-09 ENCOUNTER — Telehealth: Payer: Self-pay | Admitting: Obstetrics and Gynecology

## 2015-03-09 NOTE — Telephone Encounter (Signed)
Dr. Gevena Mart is returning a call to [facility] about [employer]. Please call him on his cell phone [phone]

## 2015-03-09 NOTE — Telephone Encounter (Addendum)
Spoke with Dr. Gevena Mart regarding reccommended breast imaging recall. He advised that patient should have diagnostic mammogram three months from 02/05/15. He requested that an order be sent to [facility] for diagnostic L Mammogram and a note advising that Dr. Gevena Mart be present at exam as he is aware of her history.  3 month recall in place. Order signed by Dr. Quincy Simmonds and faxed to [facility].  Routing to provider for final review. Patient agreeable to disposition. Will close encounter.

## 2015-03-10 DIAGNOSIS — E890 Postprocedural hypothyroidism: Secondary | ICD-10-CM | POA: Diagnosis not present

## 2015-03-10 DIAGNOSIS — E210 Primary hyperparathyroidism: Secondary | ICD-10-CM | POA: Diagnosis not present

## 2015-03-12 DIAGNOSIS — M8589 Other specified disorders of bone density and structure, multiple sites: Secondary | ICD-10-CM | POA: Diagnosis not present

## 2015-03-12 DIAGNOSIS — E890 Postprocedural hypothyroidism: Secondary | ICD-10-CM | POA: Diagnosis not present

## 2015-03-12 DIAGNOSIS — E210 Primary hyperparathyroidism: Secondary | ICD-10-CM | POA: Diagnosis not present

## 2015-04-01 DIAGNOSIS — N3941 Urge incontinence: Secondary | ICD-10-CM | POA: Diagnosis not present

## 2015-04-01 DIAGNOSIS — M62838 Other muscle spasm: Secondary | ICD-10-CM | POA: Diagnosis not present

## 2015-04-01 DIAGNOSIS — R278 Other lack of coordination: Secondary | ICD-10-CM | POA: Diagnosis not present

## 2015-04-01 DIAGNOSIS — M6281 Muscle weakness (generalized): Secondary | ICD-10-CM | POA: Diagnosis not present

## 2015-04-10 DIAGNOSIS — Z6821 Body mass index (BMI) 21.0-21.9, adult: Secondary | ICD-10-CM | POA: Diagnosis not present

## 2015-04-10 DIAGNOSIS — K219 Gastro-esophageal reflux disease without esophagitis: Secondary | ICD-10-CM | POA: Diagnosis not present

## 2015-04-10 DIAGNOSIS — F419 Anxiety disorder, unspecified: Secondary | ICD-10-CM | POA: Diagnosis not present

## 2015-04-14 DIAGNOSIS — D485 Neoplasm of uncertain behavior of skin: Secondary | ICD-10-CM | POA: Diagnosis not present

## 2015-04-14 DIAGNOSIS — L2084 Intrinsic (allergic) eczema: Secondary | ICD-10-CM | POA: Diagnosis not present

## 2015-04-14 DIAGNOSIS — L821 Other seborrheic keratosis: Secondary | ICD-10-CM | POA: Diagnosis not present

## 2015-04-14 DIAGNOSIS — L570 Actinic keratosis: Secondary | ICD-10-CM | POA: Diagnosis not present

## 2015-04-15 DIAGNOSIS — L820 Inflamed seborrheic keratosis: Secondary | ICD-10-CM | POA: Diagnosis not present

## 2015-04-22 DIAGNOSIS — M62838 Other muscle spasm: Secondary | ICD-10-CM | POA: Diagnosis not present

## 2015-04-22 DIAGNOSIS — M6281 Muscle weakness (generalized): Secondary | ICD-10-CM | POA: Diagnosis not present

## 2015-04-22 DIAGNOSIS — R278 Other lack of coordination: Secondary | ICD-10-CM | POA: Diagnosis not present

## 2015-04-22 DIAGNOSIS — N3941 Urge incontinence: Secondary | ICD-10-CM | POA: Diagnosis not present

## 2015-04-23 DIAGNOSIS — N63 Unspecified lump in breast: Secondary | ICD-10-CM | POA: Diagnosis not present

## 2015-05-06 DIAGNOSIS — M9903 Segmental and somatic dysfunction of lumbar region: Secondary | ICD-10-CM | POA: Diagnosis not present

## 2015-05-06 DIAGNOSIS — M5137 Other intervertebral disc degeneration, lumbosacral region: Secondary | ICD-10-CM | POA: Diagnosis not present

## 2015-05-07 DIAGNOSIS — M5137 Other intervertebral disc degeneration, lumbosacral region: Secondary | ICD-10-CM | POA: Diagnosis not present

## 2015-05-07 DIAGNOSIS — M9903 Segmental and somatic dysfunction of lumbar region: Secondary | ICD-10-CM | POA: Diagnosis not present

## 2015-05-13 DIAGNOSIS — M5137 Other intervertebral disc degeneration, lumbosacral region: Secondary | ICD-10-CM | POA: Diagnosis not present

## 2015-05-13 DIAGNOSIS — M9903 Segmental and somatic dysfunction of lumbar region: Secondary | ICD-10-CM | POA: Diagnosis not present

## 2015-05-14 DIAGNOSIS — F3174 Bipolar disorder, in full remission, most recent episode manic: Secondary | ICD-10-CM | POA: Diagnosis not present

## 2015-05-15 DIAGNOSIS — M9903 Segmental and somatic dysfunction of lumbar region: Secondary | ICD-10-CM | POA: Diagnosis not present

## 2015-05-15 DIAGNOSIS — M5137 Other intervertebral disc degeneration, lumbosacral region: Secondary | ICD-10-CM | POA: Diagnosis not present

## 2015-06-03 DIAGNOSIS — M810 Age-related osteoporosis without current pathological fracture: Secondary | ICD-10-CM | POA: Diagnosis not present

## 2015-06-03 DIAGNOSIS — F319 Bipolar disorder, unspecified: Secondary | ICD-10-CM | POA: Diagnosis not present

## 2015-06-03 DIAGNOSIS — J309 Allergic rhinitis, unspecified: Secondary | ICD-10-CM | POA: Diagnosis not present

## 2015-06-03 DIAGNOSIS — I10 Essential (primary) hypertension: Secondary | ICD-10-CM | POA: Diagnosis not present

## 2015-06-03 DIAGNOSIS — E038 Other specified hypothyroidism: Secondary | ICD-10-CM | POA: Diagnosis not present

## 2015-06-03 DIAGNOSIS — J45909 Unspecified asthma, uncomplicated: Secondary | ICD-10-CM | POA: Diagnosis not present

## 2015-06-03 DIAGNOSIS — Z733 Stress, not elsewhere classified: Secondary | ICD-10-CM | POA: Diagnosis not present

## 2015-06-03 DIAGNOSIS — H8110 Benign paroxysmal vertigo, unspecified ear: Secondary | ICD-10-CM | POA: Diagnosis not present

## 2015-06-03 DIAGNOSIS — Z1389 Encounter for screening for other disorder: Secondary | ICD-10-CM | POA: Diagnosis not present

## 2015-06-03 DIAGNOSIS — M5430 Sciatica, unspecified side: Secondary | ICD-10-CM | POA: Diagnosis not present

## 2015-06-03 DIAGNOSIS — Z6820 Body mass index (BMI) 20.0-20.9, adult: Secondary | ICD-10-CM | POA: Diagnosis not present

## 2015-07-07 ENCOUNTER — Other Ambulatory Visit: Payer: Self-pay | Admitting: Obstetrics and Gynecology

## 2015-07-07 DIAGNOSIS — E210 Primary hyperparathyroidism: Secondary | ICD-10-CM | POA: Diagnosis not present

## 2015-07-07 DIAGNOSIS — E890 Postprocedural hypothyroidism: Secondary | ICD-10-CM | POA: Diagnosis not present

## 2015-07-07 NOTE — Telephone Encounter (Signed)
Medication refill request: Lidocaine 2% Jelly Last AEX:  01/29/15 BS Next AEX: 02/18/16 Last MMG (if hormonal medication request): 04/23/15 [id number] Refill authorized: 01/29/15. #14mL 0R. Please advise. Thank you.

## 2015-07-10 DIAGNOSIS — E210 Primary hyperparathyroidism: Secondary | ICD-10-CM | POA: Diagnosis not present

## 2015-07-10 DIAGNOSIS — E890 Postprocedural hypothyroidism: Secondary | ICD-10-CM | POA: Diagnosis not present

## 2015-07-10 DIAGNOSIS — M8589 Other specified disorders of bone density and structure, multiple sites: Secondary | ICD-10-CM | POA: Diagnosis not present

## 2015-08-06 DIAGNOSIS — F3174 Bipolar disorder, in full remission, most recent episode manic: Secondary | ICD-10-CM | POA: Diagnosis not present

## 2015-09-15 DIAGNOSIS — F3174 Bipolar disorder, in full remission, most recent episode manic: Secondary | ICD-10-CM | POA: Diagnosis not present

## 2015-09-25 DIAGNOSIS — Z79899 Other long term (current) drug therapy: Secondary | ICD-10-CM | POA: Diagnosis not present

## 2015-10-01 DIAGNOSIS — Z23 Encounter for immunization: Secondary | ICD-10-CM | POA: Diagnosis not present

## 2015-10-29 DIAGNOSIS — M810 Age-related osteoporosis without current pathological fracture: Secondary | ICD-10-CM | POA: Diagnosis not present

## 2015-10-29 DIAGNOSIS — Z803 Family history of malignant neoplasm of breast: Secondary | ICD-10-CM | POA: Diagnosis not present

## 2015-10-29 DIAGNOSIS — Z1231 Encounter for screening mammogram for malignant neoplasm of breast: Secondary | ICD-10-CM | POA: Diagnosis not present

## 2015-11-06 DIAGNOSIS — F3174 Bipolar disorder, in full remission, most recent episode manic: Secondary | ICD-10-CM | POA: Diagnosis not present

## 2015-11-06 DIAGNOSIS — R922 Inconclusive mammogram: Secondary | ICD-10-CM | POA: Diagnosis not present

## 2015-11-06 DIAGNOSIS — R928 Other abnormal and inconclusive findings on diagnostic imaging of breast: Secondary | ICD-10-CM | POA: Diagnosis not present

## 2015-11-11 DIAGNOSIS — E890 Postprocedural hypothyroidism: Secondary | ICD-10-CM | POA: Diagnosis not present

## 2015-11-11 DIAGNOSIS — E210 Primary hyperparathyroidism: Secondary | ICD-10-CM | POA: Diagnosis not present

## 2015-11-13 DIAGNOSIS — E210 Primary hyperparathyroidism: Secondary | ICD-10-CM | POA: Diagnosis not present

## 2015-11-13 DIAGNOSIS — M8589 Other specified disorders of bone density and structure, multiple sites: Secondary | ICD-10-CM | POA: Diagnosis not present

## 2015-11-13 DIAGNOSIS — E890 Postprocedural hypothyroidism: Secondary | ICD-10-CM | POA: Diagnosis not present

## 2015-11-16 ENCOUNTER — Telehealth: Payer: Self-pay

## 2015-11-16 ENCOUNTER — Other Ambulatory Visit: Payer: Self-pay | Admitting: Radiology

## 2015-11-16 DIAGNOSIS — N6489 Other specified disorders of breast: Secondary | ICD-10-CM | POA: Diagnosis not present

## 2015-11-16 DIAGNOSIS — N6324 Unspecified lump in the left breast, lower inner quadrant: Secondary | ICD-10-CM | POA: Diagnosis not present

## 2015-11-16 NOTE — Telephone Encounter (Signed)
Spoke with patient and reviewed her Bone Density results from [place]. Per Dr.Silva patient has osteoporosis of Lt.hip which is improved from 2015. She has osteopenia of Rt.hip and spine which has worsened from 2015. Patient will follow up with Dr.[name] who is managing this for her. She thanked me for calling her.

## 2015-11-22 DIAGNOSIS — J22 Unspecified acute lower respiratory infection: Secondary | ICD-10-CM | POA: Diagnosis not present

## 2015-11-22 DIAGNOSIS — R0602 Shortness of breath: Secondary | ICD-10-CM | POA: Diagnosis not present

## 2015-11-29 DIAGNOSIS — J22 Unspecified acute lower respiratory infection: Secondary | ICD-10-CM | POA: Diagnosis not present

## 2015-12-02 DIAGNOSIS — M810 Age-related osteoporosis without current pathological fracture: Secondary | ICD-10-CM | POA: Diagnosis not present

## 2015-12-02 DIAGNOSIS — F319 Bipolar disorder, unspecified: Secondary | ICD-10-CM | POA: Diagnosis not present

## 2015-12-02 DIAGNOSIS — R8290 Unspecified abnormal findings in urine: Secondary | ICD-10-CM | POA: Diagnosis not present

## 2015-12-02 DIAGNOSIS — N390 Urinary tract infection, site not specified: Secondary | ICD-10-CM | POA: Diagnosis not present

## 2015-12-02 DIAGNOSIS — E038 Other specified hypothyroidism: Secondary | ICD-10-CM | POA: Diagnosis not present

## 2015-12-02 DIAGNOSIS — I10 Essential (primary) hypertension: Secondary | ICD-10-CM | POA: Diagnosis not present

## 2015-12-07 DIAGNOSIS — M9903 Segmental and somatic dysfunction of lumbar region: Secondary | ICD-10-CM | POA: Diagnosis not present

## 2015-12-07 DIAGNOSIS — M5137 Other intervertebral disc degeneration, lumbosacral region: Secondary | ICD-10-CM | POA: Diagnosis not present

## 2015-12-09 DIAGNOSIS — K219 Gastro-esophageal reflux disease without esophagitis: Secondary | ICD-10-CM | POA: Diagnosis not present

## 2015-12-09 DIAGNOSIS — F319 Bipolar disorder, unspecified: Secondary | ICD-10-CM | POA: Diagnosis not present

## 2015-12-09 DIAGNOSIS — J45909 Unspecified asthma, uncomplicated: Secondary | ICD-10-CM | POA: Diagnosis not present

## 2015-12-09 DIAGNOSIS — J309 Allergic rhinitis, unspecified: Secondary | ICD-10-CM | POA: Diagnosis not present

## 2015-12-09 DIAGNOSIS — F419 Anxiety disorder, unspecified: Secondary | ICD-10-CM | POA: Diagnosis not present

## 2015-12-09 DIAGNOSIS — I10 Essential (primary) hypertension: Secondary | ICD-10-CM | POA: Diagnosis not present

## 2015-12-09 DIAGNOSIS — Z0000 Encounter for general adult medical examination without abnormal findings: Secondary | ICD-10-CM | POA: Diagnosis not present

## 2015-12-09 DIAGNOSIS — M5430 Sciatica, unspecified side: Secondary | ICD-10-CM | POA: Diagnosis not present

## 2015-12-09 DIAGNOSIS — M810 Age-related osteoporosis without current pathological fracture: Secondary | ICD-10-CM | POA: Diagnosis not present

## 2015-12-09 DIAGNOSIS — Z6822 Body mass index (BMI) 22.0-22.9, adult: Secondary | ICD-10-CM | POA: Diagnosis not present

## 2015-12-09 DIAGNOSIS — Z1389 Encounter for screening for other disorder: Secondary | ICD-10-CM | POA: Diagnosis not present

## 2015-12-18 ENCOUNTER — Other Ambulatory Visit: Payer: Self-pay | Admitting: Surgery

## 2015-12-18 DIAGNOSIS — N6022 Fibroadenosis of left breast: Secondary | ICD-10-CM

## 2015-12-22 ENCOUNTER — Encounter (HOSPITAL_BASED_OUTPATIENT_CLINIC_OR_DEPARTMENT_OTHER): Payer: Self-pay | Admitting: *Deleted

## 2015-12-22 DIAGNOSIS — D1800 Hemangioma unspecified site: Secondary | ICD-10-CM | POA: Diagnosis not present

## 2015-12-22 DIAGNOSIS — Z86018 Personal history of other benign neoplasm: Secondary | ICD-10-CM | POA: Diagnosis not present

## 2015-12-22 DIAGNOSIS — L853 Xerosis cutis: Secondary | ICD-10-CM | POA: Diagnosis not present

## 2015-12-22 DIAGNOSIS — L821 Other seborrheic keratosis: Secondary | ICD-10-CM | POA: Diagnosis not present

## 2015-12-22 DIAGNOSIS — Z85828 Personal history of other malignant neoplasm of skin: Secondary | ICD-10-CM | POA: Diagnosis not present

## 2015-12-22 DIAGNOSIS — Z23 Encounter for immunization: Secondary | ICD-10-CM | POA: Diagnosis not present

## 2015-12-22 DIAGNOSIS — L814 Other melanin hyperpigmentation: Secondary | ICD-10-CM | POA: Diagnosis not present

## 2015-12-22 DIAGNOSIS — D225 Melanocytic nevi of trunk: Secondary | ICD-10-CM | POA: Diagnosis not present

## 2015-12-24 DIAGNOSIS — N6489 Other specified disorders of breast: Secondary | ICD-10-CM | POA: Diagnosis not present

## 2015-12-24 NOTE — Pre-Procedure Instructions (Signed)
Boost Breeze 8 oz. given to pt. with instructions to drink by 0630 DOS; pt. voiced understanding

## 2015-12-27 NOTE — H&P (Signed)
Amy [name] 12/18/2015 10:42 AM Location: [facility] Patient #: [id number] DOB: [birth year] Widowed / Language: [name] / Race: White Female   History of Present Illness (Amy [name] A. Ninfa Linden MD; 12/18/2015 11:10 AM) Patient words: L breast papilloma.  The patient is a 70 year old female who presents with a breast mass. This patient is referred by Dr. Christene Slates after the recent diagnosis of a left breast complex sclerosing lesion. This lesion was seen on screening mammography. It was approximate 6 mm in size. Stereotactic biopsy was performed confirming the diagnosis. Lumpectomy has been recommended. She has a significant family history of breast cancer. She has a sister who died recently of breast cancer at age 55. She is otherwise without complaints.   Allergies Amy [name], CMA; 12/18/2015 10:43 AM) No Known Drug Allergies 02/03/2014  Medication History (Amy [name], CMA; 12/18/2015 10:46 AM) BuPROPion HCl (100MG  Tablet, Oral) Active. Levothyroxine Sodium (88MCG Tablet, Oral) Active. Lithium Carbonate (150MG  Capsule, Oral) Active. LORazepam (0.5MG  Tablet, Oral) Active. Mirtazapine (7.5MG  Tablet, Oral) Active. RaNITidine HCl (150MG  Capsule, Oral) Active. Esomeprazole Magnesium (40MG  Capsule DR, Oral) Active. Citracal/Vitamin D 1200/1000 Active. Metamucil Clear & Natural (Oral) Active. Medications Reconciled  Vitals (Amy [name] CMA; 12/18/2015 10:43 AM) 12/18/2015 10:42 AM Weight: 135.6 lb Height: 66in Body Surface Area: 1.7 m Body Mass Index: 21.89 kg/m  Temp.: 98.64F(Oral)  Pulse: 82 (Regular)  BP: 110/70 (Sitting, Left Arm, Standard)       Physical Exam (Amy [name] A. Ninfa Linden MD; 12/18/2015 11:11 AM) General Mental Status-Alert. General Appearance-Consistent with stated age. Hydration-Well hydrated. Voice-Normal.  Head and Neck Head-normocephalic, atraumatic with no lesions or palpable  masses. Trachea-midline. Thyroid Gland Characteristics - normal size and consistency.  Eye Eyeball - Bilateral-Extraocular movements intact. Sclera/Conjunctiva - Bilateral-No scleral icterus.  Chest and Lung Exam Chest and lung exam reveals -quiet, even and easy respiratory effort with no use of accessory muscles and on auscultation, normal breath sounds, no adventitious sounds and normal vocal resonance. Inspection Chest Wall - Normal. Back - normal.  Breast Breast - Left-Symmetric, Non Tender, No Biopsy scars, no Dimpling, No Inflammation, No Lumpectomy scars, No Mastectomy scars, No Peau d' Orange. Breast - Right-Symmetric, Non Tender, No Biopsy scars, no Dimpling, No Inflammation, No Lumpectomy scars, No Mastectomy scars, No Peau d' Orange. Breast Lump-No Palpable Breast Mass.  Cardiovascular Cardiovascular examination reveals -normal heart sounds, regular rate and rhythm with no murmurs and normal pedal pulses bilaterally.  Abdomen - Did not examine.  Neurologic - Did not examine.  Musculoskeletal - Did not examine.  Lymphatic Head & Neck  General Head & Neck Lymphatics: Bilateral - Description - Normal. Axillary  General Axillary Region: Bilateral - Description - Normal. Tenderness - Non Tender. Femoral & Inguinal  Generalized Femoral & Inguinal Lymphatics: Bilateral - Description - Normal. Tenderness - Non Tender.    Assessment & Plan (Amy [name] A. Ninfa Linden MD; 12/18/2015 11:12 AM) SCLEROSING ADENOSIS OF BREAST, LEFT (N60.22) Impression: I have reviewed her mammograms showing a 6 mm mass in the lower inner quadrant of the left breast as well as the final pathology showing a complex sclerosing lesion. I gave her a copy of the pathology report. Left breast radioactive seed localized lumpectomy is recommended. I discussed the reasons for this with her in detail. I discussed the surgical procedure in detail. I discussed the risks of surgery which includes  but is not limited to bleeding, infection, need for further surgery if malignancy is present, cardiopulmonary issues, postoperative recovery, etc. She understands and wishes  to proceed with surgery

## 2015-12-28 ENCOUNTER — Encounter (HOSPITAL_BASED_OUTPATIENT_CLINIC_OR_DEPARTMENT_OTHER)
Admission: RE | Disposition: A | Discharge status: Home / Self Care | Payer: Self-pay | Source: Ambulatory Visit | Attending: Surgery

## 2015-12-28 ENCOUNTER — Ambulatory Visit (HOSPITAL_BASED_OUTPATIENT_CLINIC_OR_DEPARTMENT_OTHER)
Admission: RE | Admit: 2015-12-28 | Discharge: 2015-12-28 | Disposition: A | Discharge status: Home / Self Care | Payer: Medicare Other | Source: Ambulatory Visit | Attending: Surgery | Admitting: Surgery

## 2015-12-28 ENCOUNTER — Encounter (HOSPITAL_BASED_OUTPATIENT_CLINIC_OR_DEPARTMENT_OTHER): Payer: Self-pay | Admitting: Anesthesiology

## 2015-12-28 ENCOUNTER — Ambulatory Visit (HOSPITAL_BASED_OUTPATIENT_CLINIC_OR_DEPARTMENT_OTHER): Payer: Medicare Other | Admitting: Anesthesiology

## 2015-12-28 DIAGNOSIS — Z803 Family history of malignant neoplasm of breast: Secondary | ICD-10-CM | POA: Diagnosis not present

## 2015-12-28 DIAGNOSIS — N6489 Other specified disorders of breast: Secondary | ICD-10-CM | POA: Insufficient documentation

## 2015-12-28 DIAGNOSIS — K219 Gastro-esophageal reflux disease without esophagitis: Secondary | ICD-10-CM | POA: Diagnosis not present

## 2015-12-28 DIAGNOSIS — N6012 Diffuse cystic mastopathy of left breast: Secondary | ICD-10-CM | POA: Diagnosis not present

## 2015-12-28 DIAGNOSIS — Z79899 Other long term (current) drug therapy: Secondary | ICD-10-CM | POA: Diagnosis not present

## 2015-12-28 DIAGNOSIS — N6092 Unspecified benign mammary dysplasia of left breast: Secondary | ICD-10-CM | POA: Diagnosis not present

## 2015-12-28 DIAGNOSIS — F418 Other specified anxiety disorders: Secondary | ICD-10-CM | POA: Insufficient documentation

## 2015-12-28 DIAGNOSIS — E039 Hypothyroidism, unspecified: Secondary | ICD-10-CM | POA: Diagnosis not present

## 2015-12-28 DIAGNOSIS — F319 Bipolar disorder, unspecified: Secondary | ICD-10-CM | POA: Diagnosis not present

## 2015-12-28 DIAGNOSIS — N6022 Fibroadenosis of left breast: Secondary | ICD-10-CM

## 2015-12-28 DIAGNOSIS — N6082 Other benign mammary dysplasias of left breast: Secondary | ICD-10-CM | POA: Diagnosis not present

## 2015-12-28 HISTORY — DX: Hypothyroidism, unspecified: E03.9

## 2015-12-28 HISTORY — PX: BREAST LUMPECTOMY WITH RADIOACTIVE SEED LOCALIZATION: SHX6424

## 2015-12-28 HISTORY — DX: Gastro-esophageal reflux disease without esophagitis: K21.9

## 2015-12-28 HISTORY — DX: Restless legs syndrome: G25.81

## 2015-12-28 HISTORY — DX: Unspecified lump in the left breast, unspecified quadrant: N63.20

## 2015-12-28 SURGERY — BREAST LUMPECTOMY WITH RADIOACTIVE SEED LOCALIZATION
Anesthesia: General | Site: Breast | Laterality: Left

## 2015-12-28 MED ORDER — MIDAZOLAM HCL 2 MG/2ML IJ SOLN
1.0000 mg | INTRAMUSCULAR | Status: DC | PRN
  Administered 2015-12-28: 2 mg via INTRAVENOUS

## 2015-12-28 MED ORDER — LACTATED RINGERS IV SOLN
INTRAVENOUS | Status: DC
  Administered 2015-12-28 (×2): via INTRAVENOUS

## 2015-12-28 MED ORDER — DEXAMETHASONE SODIUM PHOSPHATE 4 MG/ML IJ SOLN
INTRAMUSCULAR | Status: DC | PRN
  Administered 2015-12-28: 10 mg via INTRAVENOUS

## 2015-12-28 MED ORDER — CEFAZOLIN SODIUM-DEXTROSE 2-4 GM/100ML-% IV SOLN
2.0000 g | INTRAVENOUS | Status: AC
  Administered 2015-12-28: 2 g via INTRAVENOUS

## 2015-12-28 MED ORDER — FENTANYL CITRATE (PF) 100 MCG/2ML IJ SOLN
50.0000 ug | INTRAMUSCULAR | Status: AC | PRN
  Administered 2015-12-28: 25 ug via INTRAVENOUS
  Administered 2015-12-28: 50 ug via INTRAVENOUS
  Administered 2015-12-28: 25 ug via INTRAVENOUS

## 2015-12-28 MED ORDER — ONDANSETRON HCL 4 MG/2ML IJ SOLN: 4.0000 mg | Freq: Once | INTRAMUSCULAR | Status: DC | PRN

## 2015-12-28 MED ORDER — ONDANSETRON HCL 4 MG/2ML IJ SOLN
INTRAMUSCULAR | Status: AC
  Filled 2015-12-28: qty 2

## 2015-12-28 MED ORDER — SCOPOLAMINE 1 MG/3DAYS TD PT72: 1.0000 | MEDICATED_PATCH | Freq: Once | TRANSDERMAL | Status: DC | PRN

## 2015-12-28 MED ORDER — BUPIVACAINE HCL (PF) 0.5 % IJ SOLN
INTRAMUSCULAR | Status: DC | PRN
Start: 2015-12-28 — End: 2015-12-28
  Administered 2015-12-28 (×2): 10 mL

## 2015-12-28 MED ORDER — LIDOCAINE HCL (CARDIAC) 20 MG/ML IV SOLN
INTRAVENOUS | Status: DC | PRN
  Administered 2015-12-28: 60 mg via INTRAVENOUS

## 2015-12-28 MED ORDER — BUPIVACAINE HCL (PF) 0.5 % IJ SOLN
INTRAMUSCULAR | Status: AC
  Filled 2015-12-28: qty 30

## 2015-12-28 MED ORDER — DEXAMETHASONE SODIUM PHOSPHATE 10 MG/ML IJ SOLN
INTRAMUSCULAR | Status: AC
  Filled 2015-12-28: qty 1

## 2015-12-28 MED ORDER — FENTANYL CITRATE (PF) 100 MCG/2ML IJ SOLN
INTRAMUSCULAR | Status: AC
  Filled 2015-12-28: qty 2

## 2015-12-28 MED ORDER — FENTANYL CITRATE (PF) 100 MCG/2ML IJ SOLN: 25.0000 ug | INTRAMUSCULAR | Status: DC | PRN

## 2015-12-28 MED ORDER — PROPOFOL 10 MG/ML IV BOLUS
INTRAVENOUS | Status: AC
  Filled 2015-12-28: qty 20

## 2015-12-28 MED ORDER — CHLORHEXIDINE GLUCONATE CLOTH 2 % EX PADS: 6.0000 | MEDICATED_PAD | Freq: Once | CUTANEOUS | Status: DC

## 2015-12-28 MED ORDER — CEFAZOLIN SODIUM-DEXTROSE 2-4 GM/100ML-% IV SOLN
INTRAVENOUS | Status: AC
  Filled 2015-12-28: qty 100

## 2015-12-28 MED ORDER — MIDAZOLAM HCL 2 MG/2ML IJ SOLN
INTRAMUSCULAR | Status: AC
Start: 2015-12-28 — End: 2015-12-28
  Filled 2015-12-28: qty 2

## 2015-12-28 MED ORDER — LIDOCAINE 2% (20 MG/ML) 5 ML SYRINGE: INTRAMUSCULAR | Status: DC | PRN

## 2015-12-28 MED ORDER — PROPOFOL 10 MG/ML IV BOLUS
INTRAVENOUS | Status: DC | PRN
  Administered 2015-12-28: 150 mg via INTRAVENOUS

## 2015-12-28 MED ORDER — HYDROCODONE-ACETAMINOPHEN 5-325 MG PO TABS: 1.0000 | ORAL_TABLET | ORAL | 0 refills | Status: DC | PRN

## 2015-12-28 SURGICAL SUPPLY — 49 items
ADH SKN CLS APL DERMABOND .7 (GAUZE/BANDAGES/DRESSINGS) ×1
APPLIER CLIP 9.375 MED OPEN (MISCELLANEOUS)
APR CLP MED 9.3 20 MLT OPN (MISCELLANEOUS)
BLADE HEX COATED 2.75 (ELECTRODE) ×3 IMPLANT
BLADE SURG 15 STRL LF DISP TIS (BLADE) ×1 IMPLANT
BLADE SURG 15 STRL SS (BLADE) ×3
CANISTER SUC SOCK COL 7IN (MISCELLANEOUS) IMPLANT
CANISTER SUCT 1200ML W/VALVE (MISCELLANEOUS) IMPLANT
CHLORAPREP W/TINT 26ML (MISCELLANEOUS) ×3 IMPLANT
CLIP APPLIE 9.375 MED OPEN (MISCELLANEOUS) IMPLANT
CLIP TI WIDE RED SMALL 6 (CLIP) ×2 IMPLANT
COVER BACK TABLE 60X90IN (DRAPES) ×3 IMPLANT
COVER MAYO STAND STRL (DRAPES) ×3 IMPLANT
COVER PROBE W GEL 5X96 (DRAPES) ×3 IMPLANT
DECANTER SPIKE VIAL GLASS SM (MISCELLANEOUS) ×2 IMPLANT
DERMABOND ADVANCED (GAUZE/BANDAGES/DRESSINGS) ×2
DERMABOND ADVANCED .7 DNX12 (GAUZE/BANDAGES/DRESSINGS) ×1 IMPLANT
DEVICE DUBIN W/COMP PLATE 8390 (MISCELLANEOUS) ×3 IMPLANT
DRAPE LAPAROSCOPIC ABDOMINAL (DRAPES) ×3 IMPLANT
DRAPE UTILITY XL STRL (DRAPES) ×3 IMPLANT
ELECT REM PT RETURN 9FT ADLT (ELECTROSURGICAL) ×3
ELECTRODE REM PT RTRN 9FT ADLT (ELECTROSURGICAL) ×1 IMPLANT
GLOVE BIOGEL PI IND STRL 7.0 (GLOVE) IMPLANT
GLOVE BIOGEL PI INDICATOR 7.0 (GLOVE) ×4
GLOVE ECLIPSE 6.5 STRL STRAW (GLOVE) ×4 IMPLANT
GLOVE SURG SIGNA 7.5 PF LTX (GLOVE) ×3 IMPLANT
GOWN STRL REUS W/ TWL LRG LVL3 (GOWN DISPOSABLE) ×1 IMPLANT
GOWN STRL REUS W/ TWL XL LVL3 (GOWN DISPOSABLE) ×1 IMPLANT
GOWN STRL REUS W/TWL LRG LVL3 (GOWN DISPOSABLE) ×3
GOWN STRL REUS W/TWL XL LVL3 (GOWN DISPOSABLE) ×3
KIT MARKER MARGIN INK (KITS) ×3 IMPLANT
NDL HYPO 25X1 1.5 SAFETY (NEEDLE) ×1 IMPLANT
NEEDLE HYPO 25X1 1.5 SAFETY (NEEDLE) ×3 IMPLANT
NS IRRIG 1000ML POUR BTL (IV SOLUTION) IMPLANT
PACK BASIN DAY SURGERY FS (CUSTOM PROCEDURE TRAY) ×3 IMPLANT
PENCIL BUTTON HOLSTER BLD 10FT (ELECTRODE) ×3 IMPLANT
SLEEVE SCD COMPRESS KNEE MED (MISCELLANEOUS) ×3 IMPLANT
SPONGE GAUZE 4X4 12PLY STER LF (GAUZE/BANDAGES/DRESSINGS) IMPLANT
SPONGE LAP 4X18 X RAY DECT (DISPOSABLE) ×3 IMPLANT
SUT MNCRL AB 4-0 PS2 18 (SUTURE) ×3 IMPLANT
SUT SILK 2 0 SH (SUTURE) IMPLANT
SUT VIC AB 3-0 SH 27 (SUTURE) ×3
SUT VIC AB 3-0 SH 27X BRD (SUTURE) ×1 IMPLANT
SYR CONTROL 10ML LL (SYRINGE) ×3 IMPLANT
TOWEL OR 17X24 6PK STRL BLUE (TOWEL DISPOSABLE) ×3 IMPLANT
TOWEL OR NON WOVEN STRL DISP B (DISPOSABLE) IMPLANT
TUBE CONNECTING 20'X1/4 (TUBING)
TUBE CONNECTING 20X1/4 (TUBING) IMPLANT
YANKAUER SUCT BULB TIP NO VENT (SUCTIONS) IMPLANT

## 2015-12-28 NOTE — Anesthesia Postprocedure Evaluation (Signed)
Anesthesia Post Note  Patient: Amy [name]  Procedure(s) Performed: Procedure(s) (LRB): LEFT BREAST LUMPECTOMY WITH RADIOACTIVE SEED LOCALIZATION (Left)  Patient location during evaluation: PACU Anesthesia Type: General Level of consciousness: awake and alert Pain management: pain level controlled Vital Signs Assessment: post-procedure vital signs reviewed and stable Respiratory status: spontaneous breathing, nonlabored ventilation, respiratory function stable and patient connected to nasal cannula oxygen Cardiovascular status: blood pressure returned to baseline and stable Postop Assessment: no signs of nausea or vomiting Anesthetic complications: no       Last Vitals:  Vitals:   12/28/15 1115 12/28/15 1215  BP: (!) 159/80 (!) 171/76  Pulse: 77 72  Resp: 12 20  Temp:  36.6 C    Last Pain:  Vitals:   12/28/15 0947  TempSrc: Oral                 Catalina Gravel

## 2015-12-28 NOTE — Op Note (Signed)
LEFT BREAST LUMPECTOMY WITH RADIOACTIVE SEED LOCALIZATION  Procedure Note  Amy [name] 12/28/2015   Pre-op Diagnosis: LEFT BREAST COMPLEX SCLEROISING LESION     Post-op Diagnosis: same  Procedure(s): LEFT BREAST LUMPECTOMY WITH RADIOACTIVE SEED LOCALIZATION  Surgeon(s): Coralie Keens, MD  Anesthesia: General  Staff:  Circulator: Lisbeth Ply, RN Scrub Person: Rhea Pink Neiers, CST  Estimated Blood Loss: Minimal               Specimens: sent to path  Indications: This is [name] 70 year old female with an abnormality found on recent screening mammography. Stereotactic biopsy was performed showing [name] complex sclerosing lesion. Radioactive seed localized left breast lumpectomy has been recommended.  Procedure: The patient was brought to the operating room and identified as the correct patient. She is post upon the operating table and general anesthesia was induced. Her left breast was then prepped and draped in usual sterile fashion. I identified the area of increased uptake in the medial aspect of the breast underneath the areola. And assess the skin of the medial edge of the areola with Marcaine. I then made [name] circumareolar incision with [name] scalpel. I took this down into the breast tissue with electrocautery. I then performed [name] wide lumpectomy going down to the chest wall with the aid of the neoprobe staying circumferentially around the radioactive seed. Once the lumpectomy specimen was completely removed, I marked all margins with marker pain. I confirmed that the marker was in the specimen with the neoprobe and x-rays performed showing the radioactive seed and previously marker in the lumpectomy specimen. This was sent to pathology for evaluation. I then achieved hemostasis with the cautery. I placed to surgical clips at the lithotomy specimen. I then closed the lumpectomy site with interrupted 3-0 Vicryl sutures and then running 4-0 Monocryl. Skin glue was then applied. The patient  tolerated well. All counts were correct at the end procedure. The patient was then extubated in the operating room and taken in [name] stable condition to the recovery room.          Amy [name]   Date: 12/28/2015  Time: 10:45 AM

## 2015-12-28 NOTE — Discharge Instructions (Signed)
[facility] °Office Phone Number [phone] ° °BREAST BIOPSY/ PARTIAL MASTECTOMY: POST OP INSTRUCTIONS ° °Always review your discharge instruction sheet given to you by the facility where your surgery was performed. ° °IF YOU HAVE DISABILITY OR FAMILY LEAVE FORMS, YOU MUST BRING THEM TO THE OFFICE FOR PROCESSING.  DO NOT GIVE THEM TO YOUR DOCTOR. ° °1. A prescription for pain medication may be given to you upon discharge.  Take your pain medication as prescribed, if needed.  If narcotic pain medicine is not needed, then you may take acetaminophen (Tylenol) or ibuprofen (Advil) as needed. °2. Take your usually prescribed medications unless otherwise directed °3. If you need a refill on your pain medication, please contact your pharmacy.  They will contact our office to request authorization.  Prescriptions will not be filled after 5pm or on week-ends. °4. You should eat very light the first 24 hours after surgery, such as soup, crackers, pudding, etc.  Resume your normal diet the day after surgery. °5. Most patients will experience some swelling and bruising in the breast.  Ice packs and a good support bra will help.  Swelling and bruising can take several days to resolve.  °6. It is common to experience some constipation if taking pain medication after surgery.  Increasing fluid intake and taking a stool softener will usually help or prevent this problem from occurring.  A mild laxative (Milk of Magnesia or Miralax) should be taken according to package directions if there are no bowel movements after 48 hours. °7. Unless discharge instructions indicate otherwise, you may remove your bandages 24-48 hours after surgery, and you may shower at that time.  You may have steri-strips (small skin tapes) in place directly over the incision.  These strips should be left on the skin for 7-10 days.  If your surgeon used skin glue on the incision, you may shower in 24 hours.  The glue will flake off over the  next 2-3 weeks.  Any sutures or staples will be removed at the office during your follow-up visit. °8. ACTIVITIES:  You may resume regular daily activities (gradually increasing) beginning the next day.  Wearing a good support bra or sports bra minimizes pain and swelling.  You may have sexual intercourse when it is comfortable. °a. You may drive when you no longer are taking prescription pain medication, you can comfortably wear a seatbelt, and you can safely maneuver your car and apply brakes. °b. RETURN TO WORK:  ______________________________________________________________________________________ °9. You should see your doctor in the office for a follow-up appointment approximately two weeks after your surgery.  Your doctor’s nurse will typically make your follow-up appointment when she calls you with your pathology report.  Expect your pathology report 2-3 business days after your surgery.  You may call to check if you do not hear from us after three days. °10. OTHER INSTRUCTIONS: _______________________________________________________________________________________________ _____________________________________________________________________________________________________________________________________ °_____________________________________________________________________________________________________________________________________ °_____________________________________________________________________________________________________________________________________ ° °WHEN TO CALL YOUR DOCTOR: °1. Fever over 101.0 °2. Nausea and/or vomiting. °3. Extreme swelling or bruising. °4. Continued bleeding from incision. °5. Increased pain, redness, or drainage from the incision. ° °The clinic staff is available to answer your questions during regular business hours.  Please don’t hesitate to call and ask to speak to one of the nurses for clinical concerns.  If you have a medical emergency, go to the nearest  emergency room or call 911.  A surgeon from [facility] is always on call at the hospital. ° °For further questions, please visit [email or web address]  ° ° ° °  Post Anesthesia Home Care Instructions ° °Activity: °Get plenty of rest for the remainder of the day. A responsible adult should stay with you for 24 hours following the procedure.  °For the next 24 hours, DO NOT: °-Drive a car °-Operate machinery °-Drink alcoholic beverages °-Take any medication unless instructed by your physician °-Make any legal decisions or sign important papers. ° °Meals: °Start with liquid foods such as gelatin or soup. Progress to regular foods as tolerated. Avoid greasy, spicy, heavy foods. If nausea and/or vomiting occur, drink only clear liquids until the nausea and/or vomiting subsides. Call your physician if vomiting continues. ° °Special Instructions/Symptoms: °Your throat may feel dry or sore from the anesthesia or the breathing tube placed in your throat during surgery. If this causes discomfort, gargle with warm salt water. The discomfort should disappear within 24 hours. ° °If you had a scopolamine patch placed behind your ear for the management of post- operative nausea and/or vomiting: ° °1. The medication in the patch is effective for 72 hours, after which it should be removed.  Wrap patch in a tissue and discard in the trash. Wash hands thoroughly with soap and water. °2. You may remove the patch earlier than 72 hours if you experience unpleasant side effects which may include dry mouth, dizziness or visual disturbances. °3. Avoid touching the patch. Wash your hands with soap and water after contact with the patch. °  ° °

## 2015-12-28 NOTE — Anesthesia Procedure Notes (Signed)
Procedure Name: LMA Insertion Date/Time: 12/28/2015 10:20 AM Performed by: Maryella Shivers Pre-anesthesia Checklist: Patient identified, Emergency Drugs available, Suction available and Patient being monitored Patient Re-evaluated:Patient Re-evaluated prior to inductionOxygen Delivery Method: Circle system utilized Preoxygenation: Pre-oxygenation with 100% oxygen Intubation Type: IV induction Ventilation: Mask ventilation without difficulty LMA: LMA inserted LMA Size: 4.0 Number of attempts: 1 Airway Equipment and Method: Bite block Placement Confirmation: positive ETCO2 Tube secured with: Tape Dental Injury: Teeth and Oropharynx as per pre-operative assessment

## 2015-12-28 NOTE — Anesthesia Preprocedure Evaluation (Signed)
Anesthesia Evaluation  Patient identified by MRN, date of birth, ID band Patient awake    Reviewed: Allergy & Precautions, NPO status , Patient's Chart, lab work & pertinent test results  Airway Mallampati: II  TM Distance: >3 FB Neck ROM: Full    Dental  (+) Teeth Intact, Dental Advisory Given   Pulmonary neg pulmonary ROS,    Pulmonary exam normal breath sounds clear to auscultation       Cardiovascular Exercise Tolerance: Good negative cardio ROS Normal cardiovascular exam Rhythm:Regular Rate:Normal     Neuro/Psych PSYCHIATRIC DISORDERS Anxiety Depression Bipolar Disorder negative neurological ROS     GI/Hepatic Neg liver ROS, GERD  Medicated and Controlled,  Endo/Other  Hypothyroidism   Renal/GU negative Renal ROS     Musculoskeletal negative musculoskeletal ROS (+)   Abdominal   Peds  Hematology negative hematology ROS (+)   Anesthesia Other Findings Day of surgery medications reviewed with the patient.  Reproductive/Obstetrics                             Anesthesia Physical Anesthesia Plan  ASA: II  Anesthesia Plan: General   Post-op Pain Management:    Induction: Intravenous  Airway Management Planned: LMA  Additional Equipment:   Intra-op Plan:   Post-operative Plan: Extubation in OR  Informed Consent: I have reviewed the patients History and Physical, chart, labs and discussed the procedure including the risks, benefits and alternatives for the proposed anesthesia with the patient or authorized representative who has indicated his/her understanding and acceptance.   Dental advisory given  Plan Discussed with: CRNA  Anesthesia Plan Comments: (Risks/benefits of general anesthesia discussed with patient including risk of damage to teeth, lips, gum, and tongue, nausea/vomiting, allergic reactions to medications, and the possibility of heart attack, stroke and death.   All patient questions answered.  Patient wishes to proceed.)        Anesthesia Quick Evaluation

## 2015-12-28 NOTE — Interval H&P Note (Signed)
History and Physical Interval Note: no change in H and P  12/28/2015 9:29 AM  Amy [name]  has presented today for surgery, with the diagnosis of LEFT BREAST COMPLEX SCLEROISING LESION  The various methods of treatment have been discussed with the patient and family. After consideration of risks, benefits and other options for treatment, the patient has consented to  Procedure(s): LEFT BREAST LUMPECTOMY WITH RADIOACTIVE SEED LOCALIZATION (Left) as a surgical intervention .  The patient's history has been reviewed, patient examined, no change in status, stable for surgery.  I have reviewed the patient's chart and labs.  Questions were answered to the patient's satisfaction.     [name]

## 2015-12-28 NOTE — Transfer of Care (Signed)
Immediate Anesthesia Transfer of Care Note  Patient: Amy [name]  Procedure(s) Performed: Procedure(s): LEFT BREAST LUMPECTOMY WITH RADIOACTIVE SEED LOCALIZATION (Left)  Patient Location: PACU  Anesthesia Type:General  Level of Consciousness: sedated  Airway & Oxygen Therapy: Patient Spontanous Breathing and Patient connected to face mask oxygen  Post-op Assessment: Report given to RN and Post -op Vital signs reviewed and stable  Post vital signs: Reviewed and stable  Last Vitals:  Vitals:   12/28/15 0947  BP: (!) 149/70  Pulse: 80  Resp: 18  Temp: 36.6 C    Last Pain:  Vitals:   12/28/15 0947  TempSrc: Oral      Patients Stated Pain Goal: 0 ([id number])  Complications: No apparent anesthesia complications

## 2015-12-29 ENCOUNTER — Encounter (HOSPITAL_BASED_OUTPATIENT_CLINIC_OR_DEPARTMENT_OTHER): Payer: Self-pay | Admitting: Surgery

## 2015-12-29 DIAGNOSIS — Z1212 Encounter for screening for malignant neoplasm of rectum: Secondary | ICD-10-CM | POA: Diagnosis not present

## 2015-12-31 DIAGNOSIS — F3174 Bipolar disorder, in full remission, most recent episode manic: Secondary | ICD-10-CM | POA: Diagnosis not present

## 2016-02-15 NOTE — Progress Notes (Signed)
71 y.o. G2P2 Widowed Caucasian female here for annual exam.    Has left breast lumpectomy in Dec. 2017.  Feels bruised along her bra line.   Some urinary frequency and urgency.  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 Some leakage of urine with cough and sneeze.  Did physical therapy and did not like it.  Wears a pad.  Up once a night to void.   Does not have glaucoma or an arrhythmia.  Taking metamucil for regular bowel movements. Having abdominal bloating but this is less now that she has decreased the Metamucil.    Uses Biotene for dry mouth.   May need refill of vaginal estrogen cream and lidocaine jelly in the future.  Does not need today.   Happy with her referrals.   PCP:  Burnard Bunting, MD  Patient's last menstrual period was 01/10/2005 (approximate).           Sexually active: No  Female The current method of family planning is post menopausal status.    Exercising: Yes.    Walking and yoga Smoker:  no  Health Maintenance: Pap:  01-23-14 negative, 10-17-07 Neg History of abnormal Pap:  no MMG: 02-05-15 3D Diag.MMG Density C/benign densities in Lt.Br./BiRads2--U/S Lt.Br.Neg--f/u in 3 months. 04-23-15 3D Diag.Lt.Br./ Density B/Bx clip still present/Neg/routine follow up 76months/BiRads1:[facility] Colonoscopy:  12/2011 normal with Dr. Alferd [facility]);next due 12/2016.  BMD: 10-29-15   Result: Osteoporosis of Lt.hip(improved from 2015) and has Osteopenia of Rt.hip and spine(worsened from 2015) TDaP:  2016 Gardasil:   N/A HIV:  Will do with PCP. Hep C:  Will do with PCP. Screening Labs:  Hb today: PCP, Urine today:  ----   reports that she has never smoked. She has never used smokeless tobacco. She reports that she does not drink alcohol or use drugs.  Past Medical History:  Diagnosis Date  . Anxiety   . Bipolar 1 disorder ([place])   . Breast mass, left   . Cancer (HCC)    hx of skin cancer  . Confusion   . Depression   . Fibroid   . GERD (gastroesophageal reflux disease)   .  Hyperparathyroidism   . Hypothyroidism   . Osteopenia   . Restless leg   . Thyroid disease   . UTI (lower urinary tract infection)     Past Surgical History:  Procedure Laterality Date  . BREAST LUMPECTOMY WITH RADIOACTIVE SEED LOCALIZATION Left 12/28/2015   Procedure: LEFT BREAST LUMPECTOMY WITH RADIOACTIVE SEED LOCALIZATION;  Surgeon: Coralie Keens, MD;  Location: [place];  Service: General;  Laterality: Left;  . BREAST SURGERY     breast biopsy (Rt)  . DILATION AND CURETTAGE OF UTERUS    . HYSTEROSCOPY W/D&C  2006   postmenopausal bleeding, endometrial polyp  . LIPOSUCTION TRUNK     and placed fat tissue in vocal cords  . SKIN CANCER EXCISION     hands and thigh  . THYROID SURGERY  2011, 2004   Para Thyroid  . TONSILLECTOMY      Current Outpatient Prescriptions  Medication Sig Dispense Refill  . acetaminophen (TYLENOL) 500 MG tablet Take 1,000 mg by mouth every 4 (four) hours as needed.    . Acetylcysteine (NAC 600) 600 MG CAPS Take 1 capsule by mouth daily.    [name] buPROPion (WELLBUTRIN) 100 MG tablet Take 100 mg by mouth 2 (two) times daily.    . Calcium Citrate (CITRACAL PO) Take by mouth daily.    . cholecalciferol (VITAMIN D) 1000  UNITS tablet Take 1,000 Units by mouth daily. VITAMIN D    . estradiol (ESTRACE) 0.1 MG/GM vaginal cream Apply to labia as needed    . fluticasone (FLONASE) 50 MCG/ACT nasal spray Place 2 sprays into both nostrils daily.    [name] levothyroxine (SYNTHROID, LEVOTHROID) 88 MCG tablet Take 88 mcg by mouth daily before breakfast.    . lidocaine (XYLOCAINE) 2 % jelly USE AS NEEDED FOR PAINFUL HEMORROIDS THREE TIMES DAILY 30 mL 1  . lithium carbonate 150 MG capsule Take 150 mg by mouth 3 (three) times daily with meals.     [name] loratadine (CLARITIN) 10 MG tablet Take 10 mg by mouth daily.    [name] LORazepam (ATIVAN) 0.5 MG tablet Take 0.5 mg by mouth 3 (three) times daily. For anxiety    . mirtazapine (REMERON) 7.5 MG tablet Take 7.5 mg by  mouth at bedtime.    [name] omeprazole (PRILOSEC) 40 MG capsule Take 40 mg by mouth daily.  2  . Probiotic Product (PROBIOTIC DAILY) CAPS Take by mouth daily.    . psyllium (METAMUCIL) 58.6 % packet Take 1 packet by mouth daily.    . ranitidine (ZANTAC) 150 MG tablet Take 150 mg by mouth at bedtime.     No current facility-administered medications for this visit.     Family History  Problem Relation Age of Onset  . Alzheimer's disease Mother   . Stroke Mother   . Hypertension Mother   . Diabetes Father   . Alzheimer's disease Father   . Cancer Father     colon  . Breast cancer Sister     ROS:  Pertinent items are noted in HPI.  Otherwise, a comprehensive ROS was negative.  Exam:   BP 122/84 (BP Location: Right Arm, Patient Position: Sitting, Cuff Size: Normal)   Pulse 88   Resp 20   Ht 5\' 5"  (1.651 m)   Wt 139 lb 9.6 oz (63.3 kg)   LMP 01/10/2005 (Approximate)   BMI 23.23 kg/m     General appearance: alert, cooperative and appears stated age Head: Normocephalic, without obvious abnormality, atraumatic Neck: no adenopathy, supple, symmetrical, trachea midline and thyroid normal to inspection and palpation Lungs: clear to auscultation bilaterally Breasts: scar of left breast - firm to touch, slightly tender, right breast with normal appearance, no masses or tenderness, No nipple retraction or dimpling, No nipple discharge or bleeding, No axillary or supraclavicular adenopathy Heart: regular rate and rhythm Abdomen: soft, non-tender; no masses, no organomegaly Extremities: extremities normal, atraumatic, no cyanosis or edema Skin: Skin color, texture, turgor normal. No rashes or lesions Lymph nodes: Cervical, supraclavicular, and axillary nodes normal. No abnormal inguinal nodes palpated Neurologic: Grossly normal  Pelvic: External genitalia:  no lesions              Urethra:  normal appearing urethra with no masses, tenderness or lesions              Bartholins and Skenes:  normal                 Vagina: normal appearing vagina with normal color and discharge, no lesions              Cervix: no lesions              Pap taken: Yes.   Bimanual Exam:  Uterus:  normal size, contour, position, consistency, mobility, non-tender              Adnexa: no mass,  fullness, tenderness              Rectal exam: Yes.  .  Confirms.              Anus:  normal sphincter tone, no lesions  Chaperone was present for exam.  Assessment:   Well woman visit with normal exam. Left breast mass.  Benign biopsy.  FH of twin sister with breast cancer.  FH of colon cancer.  Osteopenia.  Hyperparathyroidism.  Urge incontinence. Status post hemorrhoidal banding.   Plan: Mammogram screening discussed.  Patient is due for mammogram on May 19, 2016. Recommended self breast awareness. Pap and HR HPV as above. Guidelines for Calcium, Vitamin D, regular exercise program including cardiovascular and weight bearing exercise. We discussed bladder irritants. She declines anticholinergic and antimuscarinic meds. Osteopenia and hyperparathyroidism management per Dr. Elyse Hsu. Follow up annually and prn.       After visit summary provided.

## 2016-02-18 ENCOUNTER — Encounter: Payer: Self-pay | Admitting: Obstetrics and Gynecology

## 2016-02-18 ENCOUNTER — Ambulatory Visit (INDEPENDENT_AMBULATORY_CARE_PROVIDER_SITE_OTHER): Payer: Medicare Other | Admitting: Obstetrics and Gynecology

## 2016-02-18 VITALS — BP 122/84 | HR 88 | Resp 20 | Ht 65.0 in | Wt 139.6 lb

## 2016-02-18 DIAGNOSIS — Z124 Encounter for screening for malignant neoplasm of cervix: Secondary | ICD-10-CM | POA: Diagnosis not present

## 2016-02-18 DIAGNOSIS — Z01419 Encounter for gynecological examination (general) (routine) without abnormal findings: Secondary | ICD-10-CM | POA: Diagnosis not present

## 2016-02-18 NOTE — Patient Instructions (Signed)

## 2016-02-22 LAB — IPS PAP SMEAR ONLY

## 2016-02-23 DIAGNOSIS — F3174 Bipolar disorder, in full remission, most recent episode manic: Secondary | ICD-10-CM | POA: Diagnosis not present

## 2016-03-02 DIAGNOSIS — L821 Other seborrheic keratosis: Secondary | ICD-10-CM | POA: Diagnosis not present

## 2016-03-02 DIAGNOSIS — D1800 Hemangioma unspecified site: Secondary | ICD-10-CM | POA: Diagnosis not present

## 2016-03-02 DIAGNOSIS — K641 Second degree hemorrhoids: Secondary | ICD-10-CM | POA: Diagnosis not present

## 2016-03-02 DIAGNOSIS — D2271 Melanocytic nevi of right lower limb, including hip: Secondary | ICD-10-CM | POA: Diagnosis not present

## 2016-03-02 DIAGNOSIS — Z0000 Encounter for general adult medical examination without abnormal findings: Secondary | ICD-10-CM | POA: Diagnosis not present

## 2016-03-10 ENCOUNTER — Encounter: Payer: Self-pay | Admitting: Obstetrics and Gynecology

## 2016-03-16 DIAGNOSIS — E210 Primary hyperparathyroidism: Secondary | ICD-10-CM | POA: Diagnosis not present

## 2016-03-16 DIAGNOSIS — E890 Postprocedural hypothyroidism: Secondary | ICD-10-CM | POA: Diagnosis not present

## 2016-03-22 DIAGNOSIS — M810 Age-related osteoporosis without current pathological fracture: Secondary | ICD-10-CM | POA: Diagnosis not present

## 2016-03-22 DIAGNOSIS — E890 Postprocedural hypothyroidism: Secondary | ICD-10-CM | POA: Diagnosis not present

## 2016-03-22 DIAGNOSIS — E210 Primary hyperparathyroidism: Secondary | ICD-10-CM | POA: Diagnosis not present

## 2016-03-23 DIAGNOSIS — K648 Other hemorrhoids: Secondary | ICD-10-CM | POA: Diagnosis not present

## 2016-03-24 DIAGNOSIS — F3174 Bipolar disorder, in full remission, most recent episode manic: Secondary | ICD-10-CM | POA: Diagnosis not present

## 2016-04-06 DIAGNOSIS — K648 Other hemorrhoids: Secondary | ICD-10-CM | POA: Diagnosis not present

## 2016-04-06 DIAGNOSIS — Z800 Family history of malignant neoplasm of digestive organs: Secondary | ICD-10-CM | POA: Diagnosis not present

## 2016-05-18 DIAGNOSIS — F3132 Bipolar disorder, current episode depressed, moderate: Secondary | ICD-10-CM | POA: Diagnosis not present

## 2016-05-18 DIAGNOSIS — F3174 Bipolar disorder, in full remission, most recent episode manic: Secondary | ICD-10-CM | POA: Diagnosis not present

## 2016-06-13 DIAGNOSIS — L821 Other seborrheic keratosis: Secondary | ICD-10-CM | POA: Diagnosis not present

## 2016-06-13 DIAGNOSIS — R58 Hemorrhage, not elsewhere classified: Secondary | ICD-10-CM | POA: Diagnosis not present

## 2016-06-13 DIAGNOSIS — D1801 Hemangioma of skin and subcutaneous tissue: Secondary | ICD-10-CM | POA: Diagnosis not present

## 2016-06-13 DIAGNOSIS — C44722 Squamous cell carcinoma of skin of right lower limb, including hip: Secondary | ICD-10-CM | POA: Diagnosis not present

## 2016-06-13 DIAGNOSIS — D485 Neoplasm of uncertain behavior of skin: Secondary | ICD-10-CM | POA: Diagnosis not present

## 2016-06-16 DIAGNOSIS — E038 Other specified hypothyroidism: Secondary | ICD-10-CM | POA: Diagnosis not present

## 2016-06-16 DIAGNOSIS — J45909 Unspecified asthma, uncomplicated: Secondary | ICD-10-CM | POA: Diagnosis not present

## 2016-06-16 DIAGNOSIS — J309 Allergic rhinitis, unspecified: Secondary | ICD-10-CM | POA: Diagnosis not present

## 2016-06-16 DIAGNOSIS — M810 Age-related osteoporosis without current pathological fracture: Secondary | ICD-10-CM | POA: Diagnosis not present

## 2016-06-16 DIAGNOSIS — M5430 Sciatica, unspecified side: Secondary | ICD-10-CM | POA: Diagnosis not present

## 2016-06-16 DIAGNOSIS — Z6822 Body mass index (BMI) 22.0-22.9, adult: Secondary | ICD-10-CM | POA: Diagnosis not present

## 2016-06-16 DIAGNOSIS — Z733 Stress, not elsewhere classified: Secondary | ICD-10-CM | POA: Diagnosis not present

## 2016-06-16 DIAGNOSIS — I10 Essential (primary) hypertension: Secondary | ICD-10-CM | POA: Diagnosis not present

## 2016-06-16 DIAGNOSIS — F319 Bipolar disorder, unspecified: Secondary | ICD-10-CM | POA: Diagnosis not present

## 2016-06-16 DIAGNOSIS — F419 Anxiety disorder, unspecified: Secondary | ICD-10-CM | POA: Diagnosis not present

## 2016-06-24 DIAGNOSIS — W57XXXA Bitten or stung by nonvenomous insect and other nonvenomous arthropods, initial encounter: Secondary | ICD-10-CM | POA: Diagnosis not present

## 2016-06-24 DIAGNOSIS — M545 Low back pain: Secondary | ICD-10-CM | POA: Diagnosis not present

## 2016-06-24 DIAGNOSIS — L989 Disorder of the skin and subcutaneous tissue, unspecified: Secondary | ICD-10-CM | POA: Diagnosis not present

## 2016-06-24 DIAGNOSIS — F329 Major depressive disorder, single episode, unspecified: Secondary | ICD-10-CM | POA: Diagnosis not present

## 2016-06-29 DIAGNOSIS — F3174 Bipolar disorder, in full remission, most recent episode manic: Secondary | ICD-10-CM | POA: Diagnosis not present

## 2016-07-06 DIAGNOSIS — C44722 Squamous cell carcinoma of skin of right lower limb, including hip: Secondary | ICD-10-CM | POA: Diagnosis not present

## 2016-07-18 DIAGNOSIS — M810 Age-related osteoporosis without current pathological fracture: Secondary | ICD-10-CM | POA: Diagnosis not present

## 2016-07-18 DIAGNOSIS — E210 Primary hyperparathyroidism: Secondary | ICD-10-CM | POA: Diagnosis not present

## 2016-07-18 DIAGNOSIS — E890 Postprocedural hypothyroidism: Secondary | ICD-10-CM | POA: Diagnosis not present

## 2016-07-22 DIAGNOSIS — E890 Postprocedural hypothyroidism: Secondary | ICD-10-CM | POA: Diagnosis not present

## 2016-07-22 DIAGNOSIS — M810 Age-related osteoporosis without current pathological fracture: Secondary | ICD-10-CM | POA: Diagnosis not present

## 2016-07-22 DIAGNOSIS — E210 Primary hyperparathyroidism: Secondary | ICD-10-CM | POA: Diagnosis not present

## 2016-09-09 DIAGNOSIS — F3174 Bipolar disorder, in full remission, most recent episode manic: Secondary | ICD-10-CM | POA: Diagnosis not present

## 2016-10-17 DIAGNOSIS — F3174 Bipolar disorder, in full remission, most recent episode manic: Secondary | ICD-10-CM | POA: Diagnosis not present

## 2016-10-24 DIAGNOSIS — Z23 Encounter for immunization: Secondary | ICD-10-CM | POA: Diagnosis not present

## 2016-11-03 DIAGNOSIS — R922 Inconclusive mammogram: Secondary | ICD-10-CM | POA: Diagnosis not present

## 2016-11-03 DIAGNOSIS — Z803 Family history of malignant neoplasm of breast: Secondary | ICD-10-CM | POA: Diagnosis not present

## 2016-11-17 DIAGNOSIS — F312 Bipolar disorder, current episode manic severe with psychotic features: Secondary | ICD-10-CM | POA: Diagnosis not present

## 2016-11-17 DIAGNOSIS — E210 Primary hyperparathyroidism: Secondary | ICD-10-CM | POA: Diagnosis not present

## 2016-11-17 DIAGNOSIS — E890 Postprocedural hypothyroidism: Secondary | ICD-10-CM | POA: Diagnosis not present

## 2016-11-24 DIAGNOSIS — E210 Primary hyperparathyroidism: Secondary | ICD-10-CM | POA: Diagnosis not present

## 2016-11-24 DIAGNOSIS — E890 Postprocedural hypothyroidism: Secondary | ICD-10-CM | POA: Diagnosis not present

## 2016-11-24 DIAGNOSIS — M810 Age-related osteoporosis without current pathological fracture: Secondary | ICD-10-CM | POA: Diagnosis not present

## 2016-12-12 DIAGNOSIS — F3174 Bipolar disorder, in full remission, most recent episode manic: Secondary | ICD-10-CM | POA: Diagnosis not present

## 2016-12-12 DIAGNOSIS — R82998 Other abnormal findings in urine: Secondary | ICD-10-CM | POA: Diagnosis not present

## 2016-12-12 DIAGNOSIS — M810 Age-related osteoporosis without current pathological fracture: Secondary | ICD-10-CM | POA: Diagnosis not present

## 2016-12-12 DIAGNOSIS — E038 Other specified hypothyroidism: Secondary | ICD-10-CM | POA: Diagnosis not present

## 2016-12-12 DIAGNOSIS — I10 Essential (primary) hypertension: Secondary | ICD-10-CM | POA: Diagnosis not present

## 2016-12-15 DIAGNOSIS — F329 Major depressive disorder, single episode, unspecified: Secondary | ICD-10-CM | POA: Diagnosis not present

## 2016-12-15 DIAGNOSIS — R0602 Shortness of breath: Secondary | ICD-10-CM | POA: Diagnosis not present

## 2016-12-15 DIAGNOSIS — J22 Unspecified acute lower respiratory infection: Secondary | ICD-10-CM | POA: Diagnosis not present

## 2016-12-15 DIAGNOSIS — M545 Low back pain: Secondary | ICD-10-CM | POA: Diagnosis not present

## 2016-12-28 DIAGNOSIS — I10 Essential (primary) hypertension: Secondary | ICD-10-CM | POA: Diagnosis not present

## 2016-12-28 DIAGNOSIS — Z6820 Body mass index (BMI) 20.0-20.9, adult: Secondary | ICD-10-CM | POA: Diagnosis not present

## 2016-12-28 DIAGNOSIS — F419 Anxiety disorder, unspecified: Secondary | ICD-10-CM | POA: Diagnosis not present

## 2016-12-28 DIAGNOSIS — J45909 Unspecified asthma, uncomplicated: Secondary | ICD-10-CM | POA: Diagnosis not present

## 2016-12-28 DIAGNOSIS — E038 Other specified hypothyroidism: Secondary | ICD-10-CM | POA: Diagnosis not present

## 2016-12-28 DIAGNOSIS — Z0000 Encounter for general adult medical examination without abnormal findings: Secondary | ICD-10-CM | POA: Diagnosis not present

## 2016-12-28 DIAGNOSIS — M5430 Sciatica, unspecified side: Secondary | ICD-10-CM | POA: Diagnosis not present

## 2016-12-28 DIAGNOSIS — F319 Bipolar disorder, unspecified: Secondary | ICD-10-CM | POA: Diagnosis not present

## 2016-12-28 DIAGNOSIS — H8110 Benign paroxysmal vertigo, unspecified ear: Secondary | ICD-10-CM | POA: Diagnosis not present

## 2016-12-28 DIAGNOSIS — M810 Age-related osteoporosis without current pathological fracture: Secondary | ICD-10-CM | POA: Diagnosis not present

## 2016-12-28 DIAGNOSIS — J309 Allergic rhinitis, unspecified: Secondary | ICD-10-CM | POA: Diagnosis not present

## 2016-12-29 DIAGNOSIS — L820 Inflamed seborrheic keratosis: Secondary | ICD-10-CM | POA: Diagnosis not present

## 2016-12-29 DIAGNOSIS — L2084 Intrinsic (allergic) eczema: Secondary | ICD-10-CM | POA: Diagnosis not present

## 2016-12-29 DIAGNOSIS — Z23 Encounter for immunization: Secondary | ICD-10-CM | POA: Diagnosis not present

## 2016-12-29 DIAGNOSIS — S99921A Unspecified injury of right foot, initial encounter: Secondary | ICD-10-CM | POA: Diagnosis not present

## 2017-01-19 DIAGNOSIS — K635 Polyp of colon: Secondary | ICD-10-CM | POA: Diagnosis not present

## 2017-01-19 DIAGNOSIS — D120 Benign neoplasm of cecum: Secondary | ICD-10-CM | POA: Diagnosis not present

## 2017-01-19 DIAGNOSIS — Z86010 Personal history of colonic polyps: Secondary | ICD-10-CM | POA: Diagnosis not present

## 2017-01-19 DIAGNOSIS — K648 Other hemorrhoids: Secondary | ICD-10-CM | POA: Diagnosis not present

## 2017-01-19 DIAGNOSIS — K5730 Diverticulosis of large intestine without perforation or abscess without bleeding: Secondary | ICD-10-CM | POA: Diagnosis not present

## 2017-01-19 DIAGNOSIS — D122 Benign neoplasm of ascending colon: Secondary | ICD-10-CM | POA: Diagnosis not present

## 2017-01-19 DIAGNOSIS — Z1211 Encounter for screening for malignant neoplasm of colon: Secondary | ICD-10-CM | POA: Diagnosis not present

## 2017-02-02 DIAGNOSIS — L821 Other seborrheic keratosis: Secondary | ICD-10-CM | POA: Diagnosis not present

## 2017-02-23 ENCOUNTER — Ambulatory Visit: Payer: Medicare Other | Admitting: Obstetrics and Gynecology

## 2017-03-06 DIAGNOSIS — F3174 Bipolar disorder, in full remission, most recent episode manic: Secondary | ICD-10-CM | POA: Diagnosis not present

## 2017-03-15 NOTE — Progress Notes (Signed)
72 y.o. G2P2 Widowed Caucasian female here for annual exam.    Denies vaginal bleeding.  Does not need refill of vaginal estrogen cream.   Using lidocaine gel for rectal pain with straining.  Uses every couple of weeks.  Using Benifiber.  Went to [employer] this year.   ROS - loss of urine with sneeze or cough.  Wearing a pad.  Did pelvic floor PT but this did not help. Drinking Coke 1 per day.  Drinking tea.  Eczema.   PCP:  Burnard Bunting, MD  Patient's last menstrual period was 01/10/2005 (approximate).           Sexually active: No.  The current method of family planning is post menopausal status.    Exercising: No.   Smoker:  no  Health Maintenance: Pap: 02-18-16 Neg, 01-23-14 Neg History of abnormal Pap:  no MMG:  11-03-16 3D/Density C/Neg/BiRads1:[facility] Colonoscopy: 02/2017 polyps;next 5 years BMD:   2018  Result  Osteopenia Dr.[name] TDaP: 2016 Gardasil:   no HIV: no Hep C: no Screening Labs:  Hb today: PCP, Urine today: not done   reports that  has never smoked. she has never used smokeless tobacco. She reports that she does not drink alcohol or use drugs.  Past Medical History:  Diagnosis Date  . Anxiety   . Bipolar 1 disorder ([place])   . Breast mass, left   . Cancer (HCC)    hx of skin cancer  . Confusion   . Depression   . Fibroid   . GERD (gastroesophageal reflux disease)   . History of colon polyps   . Hyperparathyroidism   . Hypothyroidism   . Osteopenia   . Restless leg   . Thyroid disease   . UTI (lower urinary tract infection)     Past Surgical History:  Procedure Laterality Date  . BREAST LUMPECTOMY WITH RADIOACTIVE SEED LOCALIZATION Left 12/28/2015   Procedure: LEFT BREAST LUMPECTOMY WITH RADIOACTIVE SEED LOCALIZATION;  Surgeon: Coralie Keens, MD;  Location: [place];  Service: General;  Laterality: Left;  . BREAST SURGERY     breast biopsy (Rt)  . DILATION AND CURETTAGE OF UTERUS    . HYSTEROSCOPY W/D&C  2006    postmenopausal bleeding, endometrial polyp  . LIPOSUCTION TRUNK     and placed fat tissue in vocal cords  . SKIN CANCER EXCISION     hands and thigh  . THYROID SURGERY  2011, 2004   Para Thyroid  . TONSILLECTOMY      Current Outpatient Medications  Medication Sig Dispense Refill  . acetaminophen (TYLENOL) 500 MG tablet Take 1,000 mg by mouth every 4 (four) hours as needed.    . Acetylcysteine (NAC 600) 600 MG CAPS Take 1 capsule by mouth daily.    [name] buPROPion (WELLBUTRIN XL) 150 MG 24 hr tablet Take 1 tablet by mouth 2 (two) times daily.    . Calcium Citrate (CITRACAL PO) Take by mouth daily.    . cholecalciferol (VITAMIN D) 1000 UNITS tablet Take 1,000 Units by mouth daily. VITAMIN D    . estradiol (ESTRACE) 0.1 MG/GM vaginal cream Apply to labia as needed    . fluticasone (FLONASE) 50 MCG/ACT nasal spray Place 2 sprays into both nostrils daily.    [name] levothyroxine (SYNTHROID, LEVOTHROID) 88 MCG tablet Take 88 mcg by mouth daily before breakfast.    . lidocaine (XYLOCAINE) 2 % jelly USE AS NEEDED FOR PAINFUL HEMORROIDS THREE TIMES DAILY 30 mL 1  . lithium carbonate 150 MG  capsule Take 150 mg by mouth 3 (three) times daily with meals.     [name] loratadine (CLARITIN) 10 MG tablet Take 10 mg by mouth daily.    [name] LORazepam (ATIVAN) 0.5 MG tablet Take 0.5 mg by mouth 3 (three) times daily. For anxiety    . mirtazapine (REMERON) 7.5 MG tablet Take 1 tablet by mouth at bedtime.    [name] omeprazole (PRILOSEC) 40 MG capsule Take 40 mg by mouth daily.  2  . Probiotic Product (PROBIOTIC DAILY) CAPS Take by mouth daily.    . psyllium (METAMUCIL) 58.6 % packet Take 1 packet by mouth daily.    . ranitidine (ZANTAC) 150 MG tablet Take 150 mg by mouth at bedtime.    . Wheat Dextrin (BENEFIBER DRINK MIX PO) Take by mouth.     No current facility-administered medications for this visit.     Family History  Problem Relation Age of Onset  . Alzheimer's disease Mother   . Stroke Mother   . Hypertension  Mother   . Diabetes Father   . Alzheimer's disease Father   . Cancer Father        colon  . Breast cancer Sister     ROS:  Pertinent items are noted in HPI.  Otherwise, a comprehensive ROS was negative.  Exam:   BP 134/80   Pulse 80   Ht 5\' 6"  (1.676 m)   Wt 130 lb (59 kg)   LMP 01/10/2005 (Approximate)   BMI 20.98 kg/m     General appearance: alert, cooperative and appears stated age Head: Normocephalic, without obvious abnormality, atraumatic Neck: no adenopathy, supple, symmetrical, trachea midline and thyroid normal to inspection and palpation Lungs: clear to auscultation bilaterally Breasts: normal appearance, no masses or tenderness, No nipple retraction or dimpling, No nipple discharge or bleeding, No axillary or supraclavicular adenopathy Heart: regular rate and rhythm Abdomen: soft, non-tender; no masses, no organomegaly Extremities: extremities normal, atraumatic, no cyanosis or edema Skin: Skin color, texture, turgor normal. No rashes or lesions Lymph nodes: Cervical, supraclavicular, and axillary nodes normal. No abnormal inguinal nodes palpated Neurologic: Grossly normal  Pelvic: External genitalia:  no lesions              Urethra:  normal appearing urethra with no masses, tenderness or lesions              Bartholins and Skenes: normal                 Vagina: normal appearing vagina with normal color and discharge, no lesions.  Atrophy noted.              Cervix: no lesions              Pap taken: No. Bimanual Exam:  Uterus:  normal size, contour, position, consistency, mobility, non-tender              Adnexa: no mass, fullness, tenderness              Rectal exam: Yes.  .  Confirms.              Anus:  normal sphincter tone, no lesions  Chaperone was present for exam.  Assessment:   Well woman visit with normal exam. Hx left breast mass. Benign biopsy.  FH of twin sister with breast cancer.  FH of colon cancer.  Osteopenia. Endocrinology  managing. Hyperparathyroidism.  Stress incontinence. Status post hemorrhoidal banding.   Plan: Mammogram screening discussed. Recommended self breast awareness.  Pap and HR HPV as above. Guidelines for Calcium, Vitamin D, regular exercise program including cardiovascular and weight bearing exercise. We discussed stress incontinence.  She declines care for this.  Refill of vaginal estrogen and Xylocaine jelly.   We discussed effect of vaginal estrogen on breast cancer.  Follow up annually and prn.   After visit summary provided.

## 2017-03-17 DIAGNOSIS — E210 Primary hyperparathyroidism: Secondary | ICD-10-CM | POA: Diagnosis not present

## 2017-03-17 DIAGNOSIS — E890 Postprocedural hypothyroidism: Secondary | ICD-10-CM | POA: Diagnosis not present

## 2017-03-21 ENCOUNTER — Ambulatory Visit (INDEPENDENT_AMBULATORY_CARE_PROVIDER_SITE_OTHER): Payer: Medicare Other | Admitting: Obstetrics and Gynecology

## 2017-03-21 ENCOUNTER — Encounter: Payer: Self-pay | Admitting: Obstetrics and Gynecology

## 2017-03-21 VITALS — BP 134/80 | HR 80 | Ht 66.0 in | Wt 130.0 lb

## 2017-03-21 DIAGNOSIS — Z01419 Encounter for gynecological examination (general) (routine) without abnormal findings: Secondary | ICD-10-CM

## 2017-03-21 DIAGNOSIS — Z124 Encounter for screening for malignant neoplasm of cervix: Secondary | ICD-10-CM | POA: Diagnosis not present

## 2017-03-21 MED ORDER — LIDOCAINE HCL 2 % EX GEL: CUTANEOUS | 1 refills | Status: DC

## 2017-03-21 MED ORDER — ESTRADIOL 0.1 MG/GM VA CREA: TOPICAL_CREAM | VAGINAL | 1 refills | Status: DC

## 2017-03-24 DIAGNOSIS — E890 Postprocedural hypothyroidism: Secondary | ICD-10-CM | POA: Diagnosis not present

## 2017-03-24 DIAGNOSIS — M810 Age-related osteoporosis without current pathological fracture: Secondary | ICD-10-CM | POA: Diagnosis not present

## 2017-05-02 ENCOUNTER — Other Ambulatory Visit: Payer: Self-pay | Admitting: Obstetrics and Gynecology

## 2017-05-02 NOTE — Telephone Encounter (Signed)
Medication refill request: lidocaine  Last AEX:  03-21-17  Next AEX: 04-25-18  Last MMG (if hormonal medication request): 11-03-16 WNL  Refill authorized: please advise

## 2017-05-29 DIAGNOSIS — F3174 Bipolar disorder, in full remission, most recent episode manic: Secondary | ICD-10-CM | POA: Diagnosis not present

## 2017-06-07 ENCOUNTER — Other Ambulatory Visit: Payer: Self-pay | Admitting: Obstetrics and Gynecology

## 2017-06-28 DIAGNOSIS — H25013 Cortical age-related cataract, bilateral: Secondary | ICD-10-CM | POA: Diagnosis not present

## 2017-06-28 DIAGNOSIS — H04123 Dry eye syndrome of bilateral lacrimal glands: Secondary | ICD-10-CM | POA: Diagnosis not present

## 2017-06-28 DIAGNOSIS — H1852 Epithelial (juvenile) corneal dystrophy: Secondary | ICD-10-CM | POA: Diagnosis not present

## 2017-06-28 DIAGNOSIS — H2513 Age-related nuclear cataract, bilateral: Secondary | ICD-10-CM | POA: Diagnosis not present

## 2017-07-03 DIAGNOSIS — F418 Other specified anxiety disorders: Secondary | ICD-10-CM | POA: Diagnosis not present

## 2017-07-03 DIAGNOSIS — Z733 Stress, not elsewhere classified: Secondary | ICD-10-CM | POA: Diagnosis not present

## 2017-07-03 DIAGNOSIS — H8113 Benign paroxysmal vertigo, bilateral: Secondary | ICD-10-CM | POA: Diagnosis not present

## 2017-07-03 DIAGNOSIS — I10 Essential (primary) hypertension: Secondary | ICD-10-CM | POA: Diagnosis not present

## 2017-07-03 DIAGNOSIS — W5501XA Bitten by cat, initial encounter: Secondary | ICD-10-CM | POA: Diagnosis not present

## 2017-07-03 DIAGNOSIS — F3189 Other bipolar disorder: Secondary | ICD-10-CM | POA: Diagnosis not present

## 2017-07-03 DIAGNOSIS — M5430 Sciatica, unspecified side: Secondary | ICD-10-CM | POA: Diagnosis not present

## 2017-07-03 DIAGNOSIS — Z6821 Body mass index (BMI) 21.0-21.9, adult: Secondary | ICD-10-CM | POA: Diagnosis not present

## 2017-07-03 DIAGNOSIS — J45998 Other asthma: Secondary | ICD-10-CM | POA: Diagnosis not present

## 2017-07-03 DIAGNOSIS — K219 Gastro-esophageal reflux disease without esophagitis: Secondary | ICD-10-CM | POA: Diagnosis not present

## 2017-07-03 DIAGNOSIS — M810 Age-related osteoporosis without current pathological fracture: Secondary | ICD-10-CM | POA: Diagnosis not present

## 2017-07-14 ENCOUNTER — Other Ambulatory Visit: Payer: Self-pay | Admitting: Obstetrics and Gynecology

## 2017-07-14 NOTE — Telephone Encounter (Signed)
Received refill request for estrace cream & lidocaine. Last filled 06-07-17. Per pharmacy, the patient did not request this refill. It automatically sends out the request 30 days after last fill. They are aware these will be denied & pt can let us know when she needs a refill.

## 2017-07-17 DIAGNOSIS — M545 Low back pain: Secondary | ICD-10-CM | POA: Diagnosis not present

## 2017-07-17 DIAGNOSIS — F329 Major depressive disorder, single episode, unspecified: Secondary | ICD-10-CM | POA: Diagnosis not present

## 2017-07-17 DIAGNOSIS — A499 Bacterial infection, unspecified: Secondary | ICD-10-CM | POA: Diagnosis not present

## 2017-07-17 DIAGNOSIS — N390 Urinary tract infection, site not specified: Secondary | ICD-10-CM | POA: Diagnosis not present

## 2017-08-09 DIAGNOSIS — F3174 Bipolar disorder, in full remission, most recent episode manic: Secondary | ICD-10-CM | POA: Diagnosis not present

## 2017-09-20 DIAGNOSIS — Z23 Encounter for immunization: Secondary | ICD-10-CM | POA: Diagnosis not present

## 2017-09-22 DIAGNOSIS — E890 Postprocedural hypothyroidism: Secondary | ICD-10-CM | POA: Diagnosis not present

## 2017-09-22 DIAGNOSIS — E210 Primary hyperparathyroidism: Secondary | ICD-10-CM | POA: Diagnosis not present

## 2017-09-29 DIAGNOSIS — M810 Age-related osteoporosis without current pathological fracture: Secondary | ICD-10-CM | POA: Diagnosis not present

## 2017-09-29 DIAGNOSIS — E210 Primary hyperparathyroidism: Secondary | ICD-10-CM | POA: Diagnosis not present

## 2017-09-29 DIAGNOSIS — E890 Postprocedural hypothyroidism: Secondary | ICD-10-CM | POA: Diagnosis not present

## 2017-10-10 ENCOUNTER — Other Ambulatory Visit: Payer: Self-pay

## 2017-10-10 MED ORDER — LEVOTHYROXINE SODIUM 88 MCG PO TABS: 88.0000 ug | ORAL_TABLET | Freq: Every day | ORAL | 1 refills | Status: DC

## 2017-10-10 NOTE — Telephone Encounter (Signed)
RX request from pharmacy for levothyroxine, pt has appointment on 11/29/17, sent enough until that appointment.

## 2017-11-01 DIAGNOSIS — M8589 Other specified disorders of bone density and structure, multiple sites: Secondary | ICD-10-CM | POA: Diagnosis not present

## 2017-11-01 DIAGNOSIS — M810 Age-related osteoporosis without current pathological fracture: Secondary | ICD-10-CM | POA: Diagnosis not present

## 2017-11-03 ENCOUNTER — Telehealth: Payer: Self-pay | Admitting: Obstetrics and Gynecology

## 2017-11-03 NOTE — Telephone Encounter (Signed)
Please contact patient regarding her BMD from [place].  I received a copy of the report.  Dr. Elyse Hsu is following her, and I am not sure if she has results yet.  She has osteoporosis of the bilateral hips and osteopenia of the spine.  A different device was used this time, so there were some differences seen in the final numbers compared to 2017.   Dr. Elyse Hsu will make final recommendations to her.

## 2017-11-06 ENCOUNTER — Other Ambulatory Visit: Payer: Self-pay

## 2017-11-06 MED ORDER — LEVOTHYROXINE SODIUM 88 MCG PO TABS: 88.0000 ug | ORAL_TABLET | Freq: Every day | ORAL | 1 refills | Status: DC

## 2017-11-06 NOTE — Telephone Encounter (Signed)
Spoke with patient, advised as seen below per Dr. Quincy Simmonds. Patient states she has not been made aware of results yet, she will f/u with Dr. [name]'s office to schedule OV. Patient verbalizes understanding and is thankful for call.  Encounter closed.

## 2017-11-09 DIAGNOSIS — Z803 Family history of malignant neoplasm of breast: Secondary | ICD-10-CM | POA: Diagnosis not present

## 2017-11-09 DIAGNOSIS — Z9889 Other specified postprocedural states: Secondary | ICD-10-CM | POA: Diagnosis not present

## 2017-11-13 ENCOUNTER — Encounter: Payer: Self-pay | Admitting: Emergency Medicine

## 2017-11-13 DIAGNOSIS — F411 Generalized anxiety disorder: Secondary | ICD-10-CM | POA: Insufficient documentation

## 2017-11-20 DIAGNOSIS — E210 Primary hyperparathyroidism: Secondary | ICD-10-CM | POA: Diagnosis not present

## 2017-11-20 DIAGNOSIS — E890 Postprocedural hypothyroidism: Secondary | ICD-10-CM | POA: Diagnosis not present

## 2017-11-20 DIAGNOSIS — M810 Age-related osteoporosis without current pathological fracture: Secondary | ICD-10-CM | POA: Diagnosis not present

## 2017-11-29 ENCOUNTER — Encounter (INDEPENDENT_AMBULATORY_CARE_PROVIDER_SITE_OTHER): Payer: Self-pay

## 2017-11-29 ENCOUNTER — Ambulatory Visit (INDEPENDENT_AMBULATORY_CARE_PROVIDER_SITE_OTHER): Payer: Medicare Other | Admitting: Psychiatry

## 2017-11-29 ENCOUNTER — Encounter: Payer: Self-pay | Admitting: Psychiatry

## 2017-11-29 DIAGNOSIS — F3181 Bipolar II disorder: Secondary | ICD-10-CM | POA: Diagnosis not present

## 2017-11-29 DIAGNOSIS — F411 Generalized anxiety disorder: Secondary | ICD-10-CM

## 2017-11-29 NOTE — Progress Notes (Signed)
Amy [name] [id number] [birth year] 72 y.o.  Subjective:   [name] is a 72 y.o. (DOB July 28, 1945) female.  Chief Complaint:  Chief Complaint  Patient presents with  . Anxiety  . Depression    [name] presents to the office today for follow-up of mood, anxiety and STM problems.  Doing fine with driving.  Things better lately with D.  Doesn't know why but pleased.  Patient reports stable mood and denies depressed or irritable moods.  Patient denies any recent difficulty with anxiety.  Patient denies difficulty with sleep initiation or maintenance. Denies appetite disturbance.  Patient reports that energy and motivation have been good.  Patient denies any difficulty with concentration.  Patient denies any suicidal ideation.  Review of Systems:  Review of Systems  Neurological: Negative for tremors and weakness.  Psychiatric/Behavioral: Negative for agitation, behavioral problems, confusion, decreased concentration, dysphoric mood, hallucinations, self-injury, sleep disturbance and suicidal ideas. The patient is not nervous/anxious and is not hyperactive.     Medications: I have reviewed the patient's current medications.  Current Outpatient Medications  Medication Sig Dispense Refill  . acetaminophen (TYLENOL) 500 MG tablet Take 1,000 mg by mouth every 4 (four) hours as needed.    . Acetylcysteine (NAC 600) 600 MG CAPS Take 1 capsule by mouth daily.    [name] buPROPion (WELLBUTRIN XL) 150 MG 24 hr tablet Take 1 tablet by mouth 2 (two) times daily.    . Calcium Citrate (CITRACAL PO) Take by mouth daily.    . cholecalciferol (VITAMIN D) 1000 UNITS tablet Take 1,000 Units by mouth daily. VITAMIN D    . estradiol (ESTRACE) 0.1 MG/GM vaginal cream Place 1/2 gram per vagina at bedtime twice weekly. 42.5 g 1  . fluticasone (FLONASE) 50 MCG/ACT nasal spray Place 2 sprays into both nostrils daily.    [name] levothyroxine (SYNTHROID, LEVOTHROID) 88 MCG tablet  Take 1 tablet (88 mcg total) by mouth daily before breakfast. 30 tablet 1  . lidocaine (XYLOCAINE) 2 % jelly USE AS NEEDED FOR PAINFUL HEMORROIDS THREE TIMES DAILY 30 mL 1  . lithium carbonate 150 MG capsule Take 150 mg by mouth 3 (three) times daily with meals.     [name] loratadine (CLARITIN) 10 MG tablet Take 10 mg by mouth daily.    [name] LORazepam (ATIVAN) 0.5 MG tablet Take 0.5 mg by mouth 3 (three) times daily. For anxiety    . mirtazapine (REMERON) 7.5 MG tablet Take 1 tablet by mouth at bedtime.    [name] omeprazole (PRILOSEC) 40 MG capsule Take 40 mg by mouth daily.  2  . Probiotic Product (PROBIOTIC DAILY) CAPS Take by mouth daily.    . psyllium (METAMUCIL) 58.6 % packet Take 1 packet by mouth daily.    . ranitidine (ZANTAC) 150 MG tablet Take 150 mg by mouth at bedtime.    . Wheat Dextrin (BENEFIBER DRINK MIX PO) Take by mouth.     No current facility-administered medications for this visit.     Medication Side Effects: None  Allergies: No Known Allergies  Past Medical History:  Diagnosis Date  . Anxiety   . Bipolar 1 disorder ([place])   . Breast mass, left   . Cancer (HCC)    hx of skin cancer  . Confusion   . Depression   . Fibroid   . GERD (gastroesophageal reflux disease)   . History of colon polyps   . Hyperparathyroidism   . Hypothyroidism   . Osteopenia   .  Restless leg   . Thyroid disease   . UTI (lower urinary tract infection)     Family History  Problem Relation Age of Onset  . Alzheimer's disease Mother   . Stroke Mother   . Hypertension Mother   . Diabetes Father   . Alzheimer's disease Father   . Cancer Father        colon  . Breast cancer Sister     Social History   Socioeconomic History  . Marital status: Widowed    Spouse name: Not on file  . Number of children: Not on file  . Years of education: Not on file  . Highest education level: Not on file  Occupational History  . Not on file  Social Needs  . Financial resource strain: Not on file  .  Food insecurity:    Worry: Not on file    Inability: Not on file  . Transportation needs:    Medical: Not on file    Non-medical: Not on file  Tobacco Use  . Smoking status: Never Smoker  . Smokeless tobacco: Never Used  Substance and Sexual Activity  . Alcohol use: No    Alcohol/week: 0.0 standard drinks  . Drug use: No  . Sexual activity: Never    Partners: Male    Birth control/protection: Post-menopausal  Lifestyle  . Physical activity:    Days per week: Not on file    Minutes per session: Not on file  . Stress: Not on file  Relationships  . Social connections:    Talks on phone: Not on file    Gets together: Not on file    Attends religious service: Not on file    Active member of club or organization: Not on file    Attends meetings of clubs or organizations: Not on file    Relationship status: Not on file  . Intimate partner violence:    Fear of current or ex partner: Not on file    Emotionally abused: Not on file    Physically abused: Not on file    Forced sexual activity: Not on file  Other Topics Concern  . Not on file  Social History Narrative  . Not on file    Past Medical History, Surgical history, Social history, and Family history were reviewed and updated as appropriate.   Please see review of systems for further details on the patient's review from today.   Objective:   Physical Exam:  LMP 01/10/2005 (Approximate)   Physical Exam  Neurological: She displays no tremor. Gait normal.  Psychiatric: Her speech is normal and behavior is normal. Judgment and thought content normal. Her mood appears not anxious. Cognition and memory are normal. She does not exhibit a depressed mood.  Neat. Insight and judgment fair. Mild forgetfulness. She is attentive.    Lab Review:     Component Value Date/Time   NA 142 09/11/2012 1330   K 3.6 09/11/2012 1330   CL 107 09/11/2012 1330   CO2 28 09/11/2012 1330   GLUCOSE 96 09/11/2012 1330   BUN 10 09/11/2012  1330   CREATININE 0.55 09/11/2012 1330   CALCIUM 10.8 (H) 09/11/2012 1330   GFRNONAA >90 09/11/2012 1330   GFRAA >90 09/11/2012 1330       Component Value Date/Time   WBC 8.3 09/11/2012 1330   RBC 4.45 09/11/2012 1330   HGB 13.7 09/11/2012 1330   HCT 42.4 09/11/2012 1330   PLT 186 09/11/2012 1330   MCV 95.3 09/11/2012  1330   MCH 30.8 09/11/2012 1330   MCHC 32.3 09/11/2012 1330   RDW 13.4 09/11/2012 1330   LYMPHSABS 1.1 09/11/2012 1330   MONOABS 0.6 09/11/2012 1330   EOSABS 0.1 09/11/2012 1330   BASOSABS 0.0 09/11/2012 1330    Lithium Lvl  Date Value Ref Range Status  06/01/2011 0.51 (L) 0.80 - 1.40 mEq/L Final     No results found for: PHENYTOIN, PHENOBARB, VALPROATE, CBMZ   Bone density test osteoporosis  Hips and spine.  .res Assessment: Plan:    Bipolar II disorder ([place])  Generalized anxiety disorder   Greater than 50% of face to face time with patient was spent on counseling and coordination of care. We discussed her medications and supplements and the purpose of each including the NAC.  She asked questions about it.  She feels her mood and anxiety and sleep are pretty stable  Supportive therapy areound chronic D relationship problems and a neighbor.  Counseled patient regarding potential benefits, risks, and side effects of lithium to include potential risk of lithium affecting thyroid and renal function.  Discussed need for periodic lab monitoring to determine drug level and to assess for potential adverse effects.  Counseled patient regarding signs and symptoms of lithium toxicity and advised that they notify office immediately or seek urgent medical attention if experiencing these signs and symptoms.  Patient advised to contact office with any questions or concerns.  This appointment was 15 minutes  FU 4 months.  Lynder Parents, MD, DFAPA   Please see After Visit Summary for patient specific instructions.  Future Appointments  Date Time Provider  [place]  04/25/2018 11:30 AM Nunzio Cobbs, MD [place] None    No orders of the defined types were placed in this encounter.     -------------------------------

## 2017-12-18 DIAGNOSIS — L821 Other seborrheic keratosis: Secondary | ICD-10-CM | POA: Diagnosis not present

## 2017-12-18 DIAGNOSIS — Z85828 Personal history of other malignant neoplasm of skin: Secondary | ICD-10-CM | POA: Diagnosis not present

## 2017-12-18 DIAGNOSIS — L853 Xerosis cutis: Secondary | ICD-10-CM | POA: Diagnosis not present

## 2017-12-18 DIAGNOSIS — Z86018 Personal history of other benign neoplasm: Secondary | ICD-10-CM | POA: Diagnosis not present

## 2017-12-18 DIAGNOSIS — Z23 Encounter for immunization: Secondary | ICD-10-CM | POA: Diagnosis not present

## 2017-12-18 DIAGNOSIS — L820 Inflamed seborrheic keratosis: Secondary | ICD-10-CM | POA: Diagnosis not present

## 2017-12-18 DIAGNOSIS — T148XXA Other injury of unspecified body region, initial encounter: Secondary | ICD-10-CM | POA: Diagnosis not present

## 2017-12-22 ENCOUNTER — Other Ambulatory Visit: Payer: Self-pay

## 2017-12-22 MED ORDER — MIRTAZAPINE 15 MG PO TABS: 7.5000 mg | ORAL_TABLET | Freq: Every day | ORAL | 1 refills | Status: DC

## 2017-12-26 ENCOUNTER — Encounter: Payer: Self-pay | Admitting: Obstetrics and Gynecology

## 2018-01-05 ENCOUNTER — Other Ambulatory Visit: Payer: Self-pay

## 2018-01-05 MED ORDER — LEVOTHYROXINE SODIUM 88 MCG PO TABS: 88.0000 ug | ORAL_TABLET | Freq: Every day | ORAL | 2 refills | Status: DC

## 2018-01-15 DIAGNOSIS — M810 Age-related osteoporosis without current pathological fracture: Secondary | ICD-10-CM | POA: Diagnosis not present

## 2018-01-15 DIAGNOSIS — E039 Hypothyroidism, unspecified: Secondary | ICD-10-CM | POA: Diagnosis not present

## 2018-01-15 DIAGNOSIS — F319 Bipolar disorder, unspecified: Secondary | ICD-10-CM | POA: Diagnosis not present

## 2018-01-15 DIAGNOSIS — R82998 Other abnormal findings in urine: Secondary | ICD-10-CM | POA: Diagnosis not present

## 2018-01-15 DIAGNOSIS — I10 Essential (primary) hypertension: Secondary | ICD-10-CM | POA: Diagnosis not present

## 2018-01-18 ENCOUNTER — Other Ambulatory Visit: Payer: Self-pay

## 2018-01-18 MED ORDER — LORAZEPAM 0.5 MG PO TABS: 1.0000 mg | ORAL_TABLET | Freq: Every day | ORAL | 0 refills | Status: DC

## 2018-01-22 DIAGNOSIS — E038 Other specified hypothyroidism: Secondary | ICD-10-CM | POA: Diagnosis not present

## 2018-01-22 DIAGNOSIS — J45909 Unspecified asthma, uncomplicated: Secondary | ICD-10-CM | POA: Diagnosis not present

## 2018-01-22 DIAGNOSIS — M5430 Sciatica, unspecified side: Secondary | ICD-10-CM | POA: Diagnosis not present

## 2018-01-22 DIAGNOSIS — G250 Essential tremor: Secondary | ICD-10-CM | POA: Diagnosis not present

## 2018-01-22 DIAGNOSIS — Z0000 Encounter for general adult medical examination without abnormal findings: Secondary | ICD-10-CM | POA: Diagnosis not present

## 2018-01-22 DIAGNOSIS — I10 Essential (primary) hypertension: Secondary | ICD-10-CM | POA: Diagnosis not present

## 2018-01-22 DIAGNOSIS — M810 Age-related osteoporosis without current pathological fracture: Secondary | ICD-10-CM | POA: Diagnosis not present

## 2018-01-22 DIAGNOSIS — Z1331 Encounter for screening for depression: Secondary | ICD-10-CM | POA: Diagnosis not present

## 2018-01-22 DIAGNOSIS — F319 Bipolar disorder, unspecified: Secondary | ICD-10-CM | POA: Diagnosis not present

## 2018-01-22 DIAGNOSIS — Z1339 Encounter for screening examination for other mental health and behavioral disorders: Secondary | ICD-10-CM | POA: Diagnosis not present

## 2018-01-22 DIAGNOSIS — H8110 Benign paroxysmal vertigo, unspecified ear: Secondary | ICD-10-CM | POA: Diagnosis not present

## 2018-01-23 DIAGNOSIS — Z1212 Encounter for screening for malignant neoplasm of rectum: Secondary | ICD-10-CM | POA: Diagnosis not present

## 2018-01-25 DIAGNOSIS — M810 Age-related osteoporosis without current pathological fracture: Secondary | ICD-10-CM | POA: Diagnosis not present

## 2018-01-25 DIAGNOSIS — E890 Postprocedural hypothyroidism: Secondary | ICD-10-CM | POA: Diagnosis not present

## 2018-01-25 DIAGNOSIS — E210 Primary hyperparathyroidism: Secondary | ICD-10-CM | POA: Diagnosis not present

## 2018-02-01 DIAGNOSIS — E210 Primary hyperparathyroidism: Secondary | ICD-10-CM | POA: Diagnosis not present

## 2018-02-01 DIAGNOSIS — E890 Postprocedural hypothyroidism: Secondary | ICD-10-CM | POA: Diagnosis not present

## 2018-02-01 DIAGNOSIS — M810 Age-related osteoporosis without current pathological fracture: Secondary | ICD-10-CM | POA: Diagnosis not present

## 2018-02-05 ENCOUNTER — Other Ambulatory Visit: Payer: Self-pay

## 2018-02-05 MED ORDER — LITHIUM CARBONATE 150 MG PO CAPS: ORAL_CAPSULE | ORAL | 0 refills | Status: DC

## 2018-02-21 DIAGNOSIS — L820 Inflamed seborrheic keratosis: Secondary | ICD-10-CM | POA: Diagnosis not present

## 2018-02-21 DIAGNOSIS — Z23 Encounter for immunization: Secondary | ICD-10-CM | POA: Diagnosis not present

## 2018-02-21 DIAGNOSIS — L219 Seborrheic dermatitis, unspecified: Secondary | ICD-10-CM | POA: Diagnosis not present

## 2018-02-21 DIAGNOSIS — D485 Neoplasm of uncertain behavior of skin: Secondary | ICD-10-CM | POA: Diagnosis not present

## 2018-03-19 ENCOUNTER — Ambulatory Visit (INDEPENDENT_AMBULATORY_CARE_PROVIDER_SITE_OTHER): Payer: Medicare Other | Admitting: Psychiatry

## 2018-03-19 ENCOUNTER — Encounter: Payer: Self-pay | Admitting: Psychiatry

## 2018-03-19 DIAGNOSIS — F5105 Insomnia due to other mental disorder: Secondary | ICD-10-CM | POA: Diagnosis not present

## 2018-03-19 DIAGNOSIS — F411 Generalized anxiety disorder: Secondary | ICD-10-CM

## 2018-03-19 DIAGNOSIS — F3181 Bipolar II disorder: Secondary | ICD-10-CM

## 2018-03-19 MED ORDER — LEVOTHYROXINE SODIUM 88 MCG PO TABS: 88.0000 ug | ORAL_TABLET | Freq: Every day | ORAL | 3 refills | Status: DC

## 2018-03-19 NOTE — Progress Notes (Signed)
VICTORIAN GUNN [id number] 1945-11-24 73 y.o.  Subjective:   [name] is a 73 y.o. (DOB 1945-09-07) female.  Chief Complaint:  Chief Complaint  Patient presents with  . Follow-up    Mediction Management   Last visit November 29, 2017. [name] presents to the office today for follow-up of mood, anxiety and STM problems.    At the last visit no medication changes were made.  Things better lately with D, this has been the chronic stressor for years.  Doesn't know why but pleased.  Disc other stressors with a recent friend when she tried to reconcile with him and he refused.  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  Struggling to learn a new phone.  Can do Facebook.  Asks about OTC branded supplements for memory.  Patient reports stable mood and denies depressed or irritable moods.  Patient denies any recent difficulty with anxiety.  Patient denies difficulty with sleep initiation or maintenance. Denies appetite disturbance.  Patient reports that energy and motivation have been good.  Patient denies any difficulty with concentration.  Patient denies any suicidal ideation.  Saw [name] in January and the labs and he had no concerns about her labs.  Normal EKG  Past Psychiatric Medication Trials: Lamotrigine, Depakote, Buspar dizzy, Benadryl HS helps sleep, Willbutrin lithium, mirtazapine 7.5, NAC cut out bc NR noted and doesn't like so many pills.  Trazodone,  olanzapine 2.5  Review of Systems:  Review of Systems  Musculoskeletal: Positive for arthralgias and back pain.       Sees chiropracter  Neurological: Negative for tremors and weakness.       Balance isn't great. 1 fall.  Psychiatric/Behavioral: Positive for decreased concentration. Negative for agitation, behavioral problems, confusion, dysphoric mood, hallucinations, self-injury, sleep disturbance and suicidal ideas. The patient is not nervous/anxious and is not hyperactive.      Medications: I have reviewed the patient's current medications.  Current Outpatient Medications  Medication Sig Dispense Refill  . acetaminophen (TYLENOL) 500 MG tablet Take 1,000 mg by mouth every 4 (four) hours as needed.    [name] buPROPion (WELLBUTRIN XL) 150 MG 24 hr tablet Take 1 tablet by mouth 2 (two) times daily.    . Calcium Citrate (CITRACAL PO) Take by mouth daily.    . cholecalciferol (VITAMIN D) 1000 UNITS tablet Take 1,000 Units by mouth daily. VITAMIN D    . estradiol (ESTRACE) 0.1 MG/GM vaginal cream Place 1/2 gram per vagina at bedtime twice weekly. 42.5 g 1  . fluticasone (FLONASE) 50 MCG/ACT nasal spray Place 2 sprays into both nostrils daily.    [name] levothyroxine (SYNTHROID, LEVOTHROID) 88 MCG tablet Take 1 tablet (88 mcg total) by mouth daily before breakfast. 90 tablet 3  . lidocaine (XYLOCAINE) 2 % jelly USE AS NEEDED FOR PAINFUL HEMORROIDS THREE TIMES DAILY 30 mL 1  . lithium carbonate 150 MG capsule Take one capsule by mouth every morning and two capsules at bedtime 270 capsule 0  . LORazepam (ATIVAN) 0.5 MG tablet Take 2 tablets (1 mg total) by mouth at bedtime. For anxiety (Patient taking differently: Take 0.5 mg by mouth at bedtime. For anxiety) 180 tablet 0  . mirtazapine (REMERON) 15 MG tablet Take 0.5 tablets (7.5 mg total) by mouth at bedtime. 45 tablet 1  . omeprazole (PRILOSEC) 40 MG capsule Take 40 mg by mouth daily.  2  . Wheat Dextrin (BENEFIBER DRINK MIX PO) Take by mouth.    . Acetylcysteine (NAC  600) 600 MG CAPS Take 1 capsule by mouth daily.    [name] loratadine (CLARITIN) 10 MG tablet Take 10 mg by mouth daily.     No current facility-administered medications for this visit.     Medication Side Effects: None  Allergies:  Allergies  Allergen Reactions  . Other Other (See Comments)    Allergic rhinitis symptoms     Past Medical History:  Diagnosis Date  . Anxiety   . Bipolar 1 disorder ([place])   . Breast mass, left   . Cancer (HCC)    hx of skin  cancer  . Confusion   . Depression   . Fibroid   . GERD (gastroesophageal reflux disease)   . History of colon polyps   . Hyperparathyroidism   . Hypothyroidism   . Osteopenia   . Restless leg   . Thyroid disease   . UTI (lower urinary tract infection)     Family History  Problem Relation Age of Onset  . Alzheimer's disease Mother   . Stroke Mother   . Hypertension Mother   . Diabetes Father   . Alzheimer's disease Father   . Cancer Father        colon  . Breast cancer Sister     Social History   Socioeconomic History  . Marital status: Widowed    Spouse name: Not on file  . Number of children: Not on file  . Years of education: Not on file  . Highest education level: Not on file  Occupational History  . Not on file  Social Needs  . Financial resource strain: Not on file  . Food insecurity:    Worry: Not on file    Inability: Not on file  . Transportation needs:    Medical: Not on file    Non-medical: Not on file  Tobacco Use  . Smoking status: Never Smoker  . Smokeless tobacco: Never Used  Substance and Sexual Activity  . Alcohol use: No    Alcohol/week: 0.0 standard drinks  . Drug use: No  . Sexual activity: Never    Partners: Male    Birth control/protection: Post-menopausal  Lifestyle  . Physical activity:    Days per week: Not on file    Minutes per session: Not on file  . Stress: Not on file  Relationships  . Social connections:    Talks on phone: Not on file    Gets together: Not on file    Attends religious service: Not on file    Active member of club or organization: Not on file    Attends meetings of clubs or organizations: Not on file    Relationship status: Not on file  . Intimate partner violence:    Fear of current or ex partner: Not on file    Emotionally abused: Not on file    Physically abused: Not on file    Forced sexual activity: Not on file  Other Topics Concern  . Not on file  Social History Narrative  . Not on file     Past Medical History, Surgical history, Social history, and Family history were reviewed and updated as appropriate.   Please see review of systems for further details on the patient's review from today.   Objective:   Physical Exam:  LMP 01/10/2005 (Approximate)   Physical Exam Neurological:     Motor: No tremor.     Gait: Gait normal.  Psychiatric:        Attention and Perception:  She is attentive.        Mood and Affect: Mood is not anxious or depressed.        Speech: Speech normal.        Behavior: Behavior normal.        Thought Content: Thought content normal.        Judgment: Judgment normal.     Comments: Neat. Insight and judgment fair. Mild forgetfulness.     Lab Review:     Component Value Date/Time   NA 142 09/11/2012 1330   K 3.6 09/11/2012 1330   CL 107 09/11/2012 1330   CO2 28 09/11/2012 1330   GLUCOSE 96 09/11/2012 1330   BUN 10 09/11/2012 1330   CREATININE 0.55 09/11/2012 1330   CALCIUM 10.8 (H) 09/11/2012 1330   GFRNONAA >90 09/11/2012 1330   GFRAA >90 09/11/2012 1330       Component Value Date/Time   WBC 8.3 09/11/2012 1330   RBC 4.45 09/11/2012 1330   HGB 13.7 09/11/2012 1330   HCT 42.4 09/11/2012 1330   PLT 186 09/11/2012 1330   MCV 95.3 09/11/2012 1330   MCH 30.8 09/11/2012 1330   MCHC 32.3 09/11/2012 1330   RDW 13.4 09/11/2012 1330   LYMPHSABS 1.1 09/11/2012 1330   MONOABS 0.6 09/11/2012 1330   EOSABS 0.1 09/11/2012 1330   BASOSABS 0.0 09/11/2012 1330    Lithium Lvl  Date Value Ref Range Status  06/01/2011 0.51 (L) 0.80 - 1.40 mEq/L Final     No results found for: PHENYTOIN, PHENOBARB, VALPROATE, CBMZ   Bone density test osteoporosis  Hips and spine.  Labs from January 6 included unremarkable CBC, comprehensive metabolic test unremarkable except for calcium 10.9 which is stable high.  Creatinine was 0.75 TSH was normal at 2.18, vitamin D was normal at 43.8, lithium level stable at 0.6 urinalysis  unremarkable  .res Assessment: Plan:    Bipolar II disorder ([place])  Generalized anxiety disorder  Insomnia due to mental condition  Borderline mild cognitive impairment but she is able to care for herself adequately.   She feels her mood and anxiety and sleep are pretty stable.  She has been on lithium for many years with good response.  Insomnia is managed with medications.  Supportive therapy areound chronic D relationship problems and a neighbor.  Counseled patient regarding potential benefits, risks, and side effects of lithium to include potential risk of lithium affecting thyroid and renal function.  Discussed need for periodic lab monitoring to determine drug level and to assess for potential adverse effects.  Counseled patient regarding signs and symptoms of lithium toxicity and advised that they notify office immediately or seek urgent medical attention if experiencing these signs and symptoms.  Patient advised to contact office with any questions or concerns.  She has a history of hyperparathyroidism and is aware that lithium can increase calcium levels.  At her age it would be very risky to attempt to find an alternative mood stabilizer when lithium has been effective.  Alternatives would expose her to different types of side effects such as those with the atypical antipsychotics including tardive dyskinesia.  We discussed the short-term risks associated with benzodiazepines including sedation and increased fall risk among others.  Discussed long-term side effect risk including dependence, potential withdrawal symptoms, and the potential eventual dose-related risk of dementia.  Also discussed concerns about the Benadryl that she is taking at night for sleep which may adversely affect her memory as well.  No med changes today  This appointment was 15 minutes   FU 6 months.  Lynder Parents, MD, DFAPA   Please see After Visit Summary for patient specific instructions.  Future  Appointments  Date Time Provider [place]  04/25/2018 11:30 AM Nunzio Cobbs, MD [place] None  09/19/2018 11:30 AM Cottle, [place]., MD [facility] None    No orders of the defined types were placed in this encounter.     -------------------------------

## 2018-03-26 ENCOUNTER — Ambulatory Visit: Payer: Medicare Other | Admitting: Psychiatry

## 2018-03-27 ENCOUNTER — Other Ambulatory Visit: Payer: Self-pay

## 2018-03-27 MED ORDER — LEVOTHYROXINE SODIUM 88 MCG PO TABS: 88.0000 ug | ORAL_TABLET | Freq: Every day | ORAL | 11 refills | Status: DC

## 2018-03-27 NOTE — Telephone Encounter (Signed)
Pt's insurance will only pay for 30 day of her levothroid 57mcg updated rx submitted to [facility]

## 2018-04-25 ENCOUNTER — Other Ambulatory Visit: Payer: Self-pay

## 2018-04-25 ENCOUNTER — Ambulatory Visit: Payer: Medicare Other | Admitting: Obstetrics and Gynecology

## 2018-04-25 MED ORDER — LITHIUM CARBONATE 150 MG PO CAPS: ORAL_CAPSULE | ORAL | 1 refills | Status: DC

## 2018-04-25 MED ORDER — LORAZEPAM 0.5 MG PO TABS: 1.0000 mg | ORAL_TABLET | Freq: Every day | ORAL | 0 refills | Status: DC

## 2018-05-31 DIAGNOSIS — E890 Postprocedural hypothyroidism: Secondary | ICD-10-CM | POA: Diagnosis not present

## 2018-05-31 DIAGNOSIS — E210 Primary hyperparathyroidism: Secondary | ICD-10-CM | POA: Diagnosis not present

## 2018-05-31 DIAGNOSIS — M810 Age-related osteoporosis without current pathological fracture: Secondary | ICD-10-CM | POA: Diagnosis not present

## 2018-06-07 DIAGNOSIS — M810 Age-related osteoporosis without current pathological fracture: Secondary | ICD-10-CM | POA: Diagnosis not present

## 2018-06-07 DIAGNOSIS — E210 Primary hyperparathyroidism: Secondary | ICD-10-CM | POA: Diagnosis not present

## 2018-06-07 DIAGNOSIS — E890 Postprocedural hypothyroidism: Secondary | ICD-10-CM | POA: Diagnosis not present

## 2018-06-27 ENCOUNTER — Other Ambulatory Visit: Payer: Self-pay

## 2018-06-28 ENCOUNTER — Encounter: Payer: Self-pay | Admitting: Obstetrics and Gynecology

## 2018-06-28 ENCOUNTER — Other Ambulatory Visit (HOSPITAL_COMMUNITY)
Admission: RE | Admit: 2018-06-28 | Discharge: 2018-06-28 | Disposition: A | Discharge status: Home / Self Care | Payer: Medicare Other | Source: Ambulatory Visit | Attending: Obstetrics and Gynecology | Admitting: Obstetrics and Gynecology

## 2018-06-28 ENCOUNTER — Ambulatory Visit (INDEPENDENT_AMBULATORY_CARE_PROVIDER_SITE_OTHER): Payer: Medicare Other | Admitting: Obstetrics and Gynecology

## 2018-06-28 VITALS — BP 116/78 | HR 88 | Temp 97.8°F | Resp 12 | Ht 65.75 in | Wt 134.0 lb

## 2018-06-28 DIAGNOSIS — Z01419 Encounter for gynecological examination (general) (routine) without abnormal findings: Secondary | ICD-10-CM

## 2018-06-28 DIAGNOSIS — Z124 Encounter for screening for malignant neoplasm of cervix: Secondary | ICD-10-CM | POA: Diagnosis not present

## 2018-06-28 NOTE — Patient Instructions (Signed)

## 2018-06-28 NOTE — Progress Notes (Signed)
73 y.o. G2P2 Widowed Caucasian female here for annual exam.    Using vaginal estrogen cream rarely.   Denies vaginal bleeding.   Wearing a pad due to urinary incontinence.  She states she is doing OK. Using Poise number 3.  No skin problems.  Notices a little bit of odor at the end of the day.  Denies vaginal discharge.  Dealing with eczema.  Sees dermatologist.   PCP: Burnard Bunting, MD     Patient's last menstrual period was 01/10/2005 (approximate).           Sexually active: No.  The current method of family planning is post menopausal status.    Exercising: No.  The patient does not participate in regular exercise at present. Smoker:  no  Health Maintenance: Pap:  02-18-16 Neg, 01-23-14 Neg History of abnormal Pap:  no MMG:  11/09/17 BIRADS 2 benign/density c Colonoscopy:  02/2017 Polyps; f/u 5 years BMD:   2019  Result  Osteoporosis.  Followed by Dr. Elyse Hsu. TDaP:  2016 Gardasil:   no HIV: no Hep C: no Screening Labs:  PCP   reports that she has never smoked. She has never used smokeless tobacco. She reports that she does not drink alcohol or use drugs.  Past Medical History:  Diagnosis Date  . Anxiety   . Bipolar 1 disorder ([place])   . Breast mass, left   . Cancer (HCC)    hx of skin cancer  . Confusion   . Depression   . Fibroid   . GERD (gastroesophageal reflux disease)   . History of colon polyps   . Hyperparathyroidism   . Hypothyroidism   . Leg lesion   . Osteopenia   . Restless leg   . Thyroid disease   . UTI (lower urinary tract infection)     Past Surgical History:  Procedure Laterality Date  . BREAST LUMPECTOMY WITH RADIOACTIVE SEED LOCALIZATION Left 12/28/2015   Procedure: LEFT BREAST LUMPECTOMY WITH RADIOACTIVE SEED LOCALIZATION;  Surgeon: Coralie Keens, MD;  Location: [facility];  Service: General;  Laterality: Left;  . BREAST SURGERY     breast biopsy ([place])  . DILATION AND CURETTAGE OF UTERUS    . HYSTEROSCOPY  W/D&C  2006   postmenopausal bleeding, endometrial polyp  . LIPOSUCTION TRUNK     and placed fat tissue in vocal cords  . SKIN CANCER EXCISION     hands and thigh  . THYROID SURGERY  2011, 2004   Para Thyroid  . TONSILLECTOMY      Current Outpatient Medications  Medication Sig Dispense Refill  . acetaminophen (TYLENOL) 500 MG tablet Take 1,000 mg by mouth every 4 (four) hours as needed.    [name] buPROPion (WELLBUTRIN XL) 150 MG 24 hr tablet Take 1 tablet by mouth 2 (two) times daily.    . Calcium Citrate (CITRACAL PO) Take by mouth daily.    [name] estradiol (ESTRACE) 0.1 MG/GM vaginal cream Place 1/2 gram per vagina at bedtime twice weekly. 42.5 g 1  . fluticasone (FLONASE) 50 MCG/ACT nasal spray Place 2 sprays into both nostrils daily.    [name] ketoconazole (NIZORAL) 2 % shampoo WASH with FIVE ML ONCE TO twice APPLY WEEK    . levothyroxine (SYNTHROID, LEVOTHROID) 88 MCG tablet Take 1 tablet (88 mcg total) by mouth daily before breakfast. 30 tablet 11  . lidocaine (XYLOCAINE) 2 % jelly USE AS NEEDED FOR PAINFUL HEMORROIDS THREE TIMES DAILY 30 mL 1  . lithium carbonate 150 MG capsule  Take one capsule by mouth every morning and two capsules at bedtime 270 capsule 1  . loratadine (CLARITIN) 10 MG tablet Take 10 mg by mouth daily.    [name] LORazepam (ATIVAN) 0.5 MG tablet Take 2 tablets (1 mg total) by mouth at bedtime. 180 tablet 0  . mirtazapine (REMERON) 15 MG tablet Take 0.5 tablets (7.5 mg total) by mouth at bedtime. 45 tablet 1  . omeprazole (PRILOSEC) 40 MG capsule Take 40 mg by mouth daily.  2  . PREVIDENT 5000 BOOSTER PLUS 1.1 % PSTE APPLY pea-sized AMOUNT TO toothbrush AND BRUSH teeth thoroughly FOR TWO MINUTES. USE DAILY in place of conventional paste    . Wheat Dextrin (BENEFIBER DRINK MIX PO) Take by mouth.    . cholecalciferol (VITAMIN D) 1000 UNITS tablet Take 1,000 Units by mouth daily. VITAMIN D     No current facility-administered medications for this visit.     Family History   Problem Relation Age of Onset  . Alzheimer's disease Mother   . Stroke Mother   . Hypertension Mother   . Diabetes Father   . Alzheimer's disease Father   . Cancer Father        colon  . Breast cancer Sister     Review of Systems  Constitutional: Negative.   HENT: Negative.   Eyes: Negative.   Respiratory: Negative.   Cardiovascular: Negative.   Gastrointestinal: Negative.   Endocrine: Negative.   Genitourinary: Negative.   Musculoskeletal: Negative.   Skin: Negative.   Allergic/Immunologic: Negative.   Neurological: Negative.   Hematological: Negative.   Psychiatric/Behavioral: Negative.     Exam:   BP 116/78 (BP Location: Right Arm, Patient Position: Sitting, Cuff Size: Normal)   Pulse 88   Temp 97.8 F (36.6 C) (Temporal)   Resp 12   Ht 5' 5.75" (1.67 m)   Wt 134 lb (60.8 kg)   LMP 01/10/2005 (Approximate)   BMI 21.79 kg/m     General appearance: alert, cooperative and appears stated age Head: normocephalic, without obvious abnormality, atraumatic Neck: no adenopathy, supple, symmetrical, trachea midline and thyroid normal to inspection and palpation Lungs: clear to auscultation bilaterally Breasts: normal appearance, no masses or tenderness, No nipple retraction or dimpling, No nipple discharge or bleeding, No axillary adenopathy Heart: regular rate and rhythm Abdomen: soft, non-tender; no masses, no organomegaly Extremities: extremities normal, atraumatic, no cyanosis or edema Skin: skin color, texture, turgor normal. No rashes or lesions Lymph nodes: cervical, supraclavicular, and axillary nodes normal. Neurologic: grossly normal  Pelvic: External genitalia:  no lesions              No abnormal inguinal nodes palpated.              Urethra:  normal appearing urethra with no masses, tenderness or lesions              Bartholins and Skenes: normal                 Vagina: normal appearing vagina with normal color and discharge, no lesions               Cervix: no lesions              Pap taken: Yes.   Bimanual Exam:  Uterus:  normal size, contour, position, consistency, mobility, non-tender              Adnexa: no mass, fullness, tenderness  Rectal exam: Yes.  .  Confirms.              Anus:  normal sphincter tone, no lesions  Chaperone was present for exam.  Assessment:   Well woman visit with normal exam. Hx left breast mass. Benign biopsy.  FH of twin sister with breast cancer.  FH of colon cancer.  Osteoporosis. Endocrinology managing. Hyperparathyroidism.  Stress incontinence. Status post hemorrhoidal banding.  Plan: Mammogram screening discussed. She will do 3D. Self breast awareness reviewed. Pap and HR HPV as above. Guidelines for Calcium, Vitamin D, regular exercise program including cardiovascular and weight bearing exercise. She does not need vaginal estrogen refill.  I did discuss potential effect on breast cancer. Follow up annually and prn.   After visit summary provided.

## 2018-06-29 LAB — CYTOLOGY - PAP: Diagnosis: NEGATIVE

## 2018-07-04 ENCOUNTER — Other Ambulatory Visit: Payer: Self-pay

## 2018-07-04 MED ORDER — MIRTAZAPINE 15 MG PO TABS: 7.5000 mg | ORAL_TABLET | Freq: Every day | ORAL | 1 refills | Status: DC

## 2018-07-25 DIAGNOSIS — I10 Essential (primary) hypertension: Secondary | ICD-10-CM | POA: Diagnosis not present

## 2018-07-25 DIAGNOSIS — G250 Essential tremor: Secondary | ICD-10-CM | POA: Diagnosis not present

## 2018-07-25 DIAGNOSIS — J45909 Unspecified asthma, uncomplicated: Secondary | ICD-10-CM | POA: Diagnosis not present

## 2018-07-25 DIAGNOSIS — M5430 Sciatica, unspecified side: Secondary | ICD-10-CM | POA: Diagnosis not present

## 2018-07-25 DIAGNOSIS — H8110 Benign paroxysmal vertigo, unspecified ear: Secondary | ICD-10-CM | POA: Diagnosis not present

## 2018-07-25 DIAGNOSIS — Z733 Stress, not elsewhere classified: Secondary | ICD-10-CM | POA: Diagnosis not present

## 2018-07-25 DIAGNOSIS — F319 Bipolar disorder, unspecified: Secondary | ICD-10-CM | POA: Diagnosis not present

## 2018-07-25 DIAGNOSIS — E039 Hypothyroidism, unspecified: Secondary | ICD-10-CM | POA: Diagnosis not present

## 2018-07-25 DIAGNOSIS — M810 Age-related osteoporosis without current pathological fracture: Secondary | ICD-10-CM | POA: Diagnosis not present

## 2018-07-25 DIAGNOSIS — K219 Gastro-esophageal reflux disease without esophagitis: Secondary | ICD-10-CM | POA: Diagnosis not present

## 2018-07-25 DIAGNOSIS — E559 Vitamin D deficiency, unspecified: Secondary | ICD-10-CM | POA: Diagnosis not present

## 2018-08-01 ENCOUNTER — Other Ambulatory Visit: Payer: Self-pay

## 2018-08-01 MED ORDER — LORAZEPAM 0.5 MG PO TABS: 1.0000 mg | ORAL_TABLET | Freq: Every day | ORAL | 0 refills | Status: DC

## 2018-08-06 DIAGNOSIS — D485 Neoplasm of uncertain behavior of skin: Secondary | ICD-10-CM | POA: Diagnosis not present

## 2018-08-06 DIAGNOSIS — C44729 Squamous cell carcinoma of skin of left lower limb, including hip: Secondary | ICD-10-CM | POA: Diagnosis not present

## 2018-08-13 ENCOUNTER — Telehealth: Payer: Self-pay | Admitting: Psychiatry

## 2018-08-13 ENCOUNTER — Other Ambulatory Visit: Payer: Self-pay | Admitting: Psychiatry

## 2018-08-13 NOTE — Telephone Encounter (Signed)
Pt called to advise Wellbutrin cost too much and don't feel it's really working. Ask nurse to return call

## 2018-08-13 NOTE — Telephone Encounter (Signed)
OK per patient request to wean off Wellbutrin.  Drop to 1 daily of the 150 mg tablets for 1 week then stop it.  Call if there's a problem.

## 2018-08-14 NOTE — Telephone Encounter (Signed)
Pt is only on Wellbutrin XL 150 mg 1 daily, instructed to go ahead and discontinue whenever she wanted to. Verbalized understanding.

## 2018-09-03 DIAGNOSIS — C44729 Squamous cell carcinoma of skin of left lower limb, including hip: Secondary | ICD-10-CM | POA: Diagnosis not present

## 2018-09-10 DIAGNOSIS — L820 Inflamed seborrheic keratosis: Secondary | ICD-10-CM | POA: Diagnosis not present

## 2018-09-10 DIAGNOSIS — D485 Neoplasm of uncertain behavior of skin: Secondary | ICD-10-CM | POA: Diagnosis not present

## 2018-09-19 ENCOUNTER — Ambulatory Visit: Payer: Medicare Other | Admitting: Psychiatry

## 2018-10-01 DIAGNOSIS — M810 Age-related osteoporosis without current pathological fracture: Secondary | ICD-10-CM | POA: Diagnosis not present

## 2018-10-01 DIAGNOSIS — E210 Primary hyperparathyroidism: Secondary | ICD-10-CM | POA: Diagnosis not present

## 2018-10-01 DIAGNOSIS — E890 Postprocedural hypothyroidism: Secondary | ICD-10-CM | POA: Diagnosis not present

## 2018-10-11 DIAGNOSIS — M810 Age-related osteoporosis without current pathological fracture: Secondary | ICD-10-CM | POA: Diagnosis not present

## 2018-10-11 DIAGNOSIS — E210 Primary hyperparathyroidism: Secondary | ICD-10-CM | POA: Diagnosis not present

## 2018-10-11 DIAGNOSIS — E890 Postprocedural hypothyroidism: Secondary | ICD-10-CM | POA: Diagnosis not present

## 2018-10-12 ENCOUNTER — Encounter: Payer: Self-pay | Admitting: Psychiatry

## 2018-10-12 ENCOUNTER — Other Ambulatory Visit: Payer: Self-pay

## 2018-10-12 ENCOUNTER — Ambulatory Visit (INDEPENDENT_AMBULATORY_CARE_PROVIDER_SITE_OTHER): Payer: Medicare Other | Admitting: Psychiatry

## 2018-10-12 DIAGNOSIS — F411 Generalized anxiety disorder: Secondary | ICD-10-CM

## 2018-10-12 DIAGNOSIS — F5105 Insomnia due to other mental disorder: Secondary | ICD-10-CM

## 2018-10-12 DIAGNOSIS — F3181 Bipolar II disorder: Secondary | ICD-10-CM | POA: Diagnosis not present

## 2018-10-12 NOTE — Progress Notes (Signed)
RACINE CAVITT [id number] 03/28/1945 73 y.o.  Subjective:   [name] is a 73 y.o. (DOB 1945/09/19) female.  Chief Complaint:  Chief Complaint  Patient presents with  . Follow-up    Medication Management  . Anxiety    Medication Management    Anxiety Symptoms include decreased concentration. Patient reports no confusion, nervous/anxious behavior or suicidal ideas.     Amy [name] presents to the office today for follow-up of mood, anxiety and STM problems.    Last seen March 19, 2018.  At the last visit no medication changes were made.  Has stayed well re: viral illness.  Has been able to still meet with 8 neighbors and that has continued to help her mental health.    Notices more trouble with word-finding and fluency and that bothers her.    Things better lately with D, this has been the chronic stressor for years.  Doesn't know why but pleased.  Disc other stressors with a recent friend when she tried to reconcile with him and he refused.   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  Struggling to learn a new phone.  Can do Facebook.  Asks about OTC branded supplements for memory.  Patient reports stable mood and denies depressed or irritable moods.  Patient denies any recent difficulty with anxiety.  Patient denies difficulty with sleep initiation or maintenance. Pleased with sleep.  Takes OTC sleep aid. Denies appetite disturbance.  Patient reports that energy and motivation have been good.  Patient denies any difficulty with concentration.  Patient denies any suicidal ideation.  Saw [name] in January and the labs and he had no concerns about her labs.  Normal EKG  Past Psychiatric Medication Trials: Lamotrigine, Depakote, lithium, olanzapine 2.5 Buspar dizzy, Benadryl HS helps sleep, Willbutrin , mirtazapine 7.5, NAC cut out bc NR noted and doesn't like so many pills.  Trazodone,    Review of Systems:  Review of Systems   Musculoskeletal: Positive for arthralgias and back pain.       Sees chiropracter  Neurological: Negative for tremors, weakness and headaches.       Balance isn't great. No recent falls.  Psychiatric/Behavioral: Positive for decreased concentration. Negative for agitation, behavioral problems, confusion, dysphoric mood, hallucinations, self-injury, sleep disturbance and suicidal ideas. The patient is not nervous/anxious and is not hyperactive.     Medications: I have reviewed the patient's current medications.  Current Outpatient Medications  Medication Sig Dispense Refill  . acetaminophen (TYLENOL) 500 MG tablet Take 1,000 mg by mouth every 4 (four) hours as needed.    . Calcium Citrate (CITRACAL PO) Take by mouth daily.    . cholecalciferol (VITAMIN D) 1000 UNITS tablet Take 1,000 Units by mouth daily. VITAMIN D    . estradiol (ESTRACE) 0.1 MG/GM vaginal cream Place 1/2 gram per vagina at bedtime twice weekly. 42.5 g 1  . fluticasone (FLONASE) 50 MCG/ACT nasal spray Place 2 sprays into both nostrils daily.    [name] ketoconazole (NIZORAL) 2 % shampoo WASH with FIVE ML ONCE TO twice APPLY WEEK    . levothyroxine (SYNTHROID, LEVOTHROID) 88 MCG tablet Take 1 tablet (88 mcg total) by mouth daily before breakfast. 30 tablet 11  . lidocaine (XYLOCAINE) 2 % jelly USE AS NEEDED FOR PAINFUL HEMORROIDS THREE TIMES DAILY 30 mL 1  . lithium carbonate 150 MG capsule Take one capsule by mouth every morning and two capsules at bedtime 270 capsule 1  . loratadine (CLARITIN) 10 MG tablet  Take 10 mg by mouth daily.    [name] LORazepam (ATIVAN) 0.5 MG tablet Take 2 tablets (1 mg total) by mouth at bedtime. 180 tablet 0  . mirtazapine (REMERON) 15 MG tablet Take 0.5 tablets (7.5 mg total) by mouth at bedtime. 45 tablet 1  . omeprazole (PRILOSEC) 40 MG capsule Take 40 mg by mouth daily.  2  . PREVIDENT 5000 BOOSTER PLUS 1.1 % PSTE APPLY pea-sized AMOUNT TO toothbrush AND BRUSH teeth thoroughly FOR TWO MINUTES. USE DAILY  in place of conventional paste    . Wheat Dextrin (BENEFIBER DRINK MIX PO) Take by mouth.    [name] buPROPion (WELLBUTRIN XL) 150 MG 24 hr tablet Take 1 tablet by mouth 2 (two) times daily.     No current facility-administered medications for this visit.     Medication Side Effects: None  Allergies:  Allergies  Allergen Reactions  . Other Other (See Comments)    Allergic rhinitis symptoms     Past Medical History:  Diagnosis Date  . Anxiety   . Bipolar 1 disorder ([place])   . Breast mass, left   . Cancer (HCC)    hx of skin cancer  . Confusion   . Depression   . Fibroid   . GERD (gastroesophageal reflux disease)   . History of colon polyps   . Hyperparathyroidism   . Hypothyroidism   . Leg lesion   . Osteopenia   . Restless leg   . Thyroid disease   . UTI (lower urinary tract infection)     Family History  Problem Relation Age of Onset  . Alzheimer's disease Mother   . Stroke Mother   . Hypertension Mother   . Diabetes Father   . Alzheimer's disease Father   . Cancer Father        colon  . Breast cancer Sister     Social History   Socioeconomic History  . Marital status: Widowed    Spouse name: Not on file  . Number of children: Not on file  . Years of education: Not on file  . Highest education level: Not on file  Occupational History  . Not on file  Social Needs  . Financial resource strain: Not on file  . Food insecurity    Worry: Not on file    Inability: Not on file  . Transportation needs    Medical: Not on file    Non-medical: Not on file  Tobacco Use  . Smoking status: Never Smoker  . Smokeless tobacco: Never Used  Substance and Sexual Activity  . Alcohol use: No    Alcohol/week: 0.0 standard drinks  . Drug use: No  . Sexual activity: Not Currently    Partners: Male    Birth control/protection: Post-menopausal  Lifestyle  . Physical activity    Days per week: Not on file    Minutes per session: Not on file  . Stress: Not on file   Relationships  . Social Herbalist on phone: Not on file    Gets together: Not on file    Attends religious service: Not on file    Active member of club or organization: Not on file    Attends meetings of clubs or organizations: Not on file    Relationship status: Not on file  . Intimate partner violence    Fear of current or ex partner: Not on file    Emotionally abused: Not on file    Physically abused: Not  on file    Forced sexual activity: Not on file  Other Topics Concern  . Not on file  Social History Narrative  . Not on file    Past Medical History, Surgical history, Social history, and Family history were reviewed and updated as appropriate.   Please see review of systems for further details on the patient's review from today.   Objective:   Physical Exam:  LMP 01/10/2005 (Approximate)   Physical Exam Neurological:     Motor: No tremor.     Gait: Gait normal.  Psychiatric:        Attention and Perception: She is attentive.        Mood and Affect: Mood is not anxious or depressed.        Speech: Speech normal.        Behavior: Behavior normal.        Thought Content: Thought content normal.        Judgment: Judgment normal.     Comments: Neat. Insight and judgment fair. Mild forgetfulness. And a little slow with thought.     Lab Review:     Component Value Date/Time   NA 142 09/11/2012 1330   K 3.6 09/11/2012 1330   CL 107 09/11/2012 1330   CO2 28 09/11/2012 1330   GLUCOSE 96 09/11/2012 1330   BUN 10 09/11/2012 1330   CREATININE 0.55 09/11/2012 1330   CALCIUM 10.8 (H) 09/11/2012 1330   GFRNONAA >90 09/11/2012 1330   GFRAA >90 09/11/2012 1330       Component Value Date/Time   WBC 8.3 09/11/2012 1330   RBC 4.45 09/11/2012 1330   HGB 13.7 09/11/2012 1330   HCT 42.4 09/11/2012 1330   PLT 186 09/11/2012 1330   MCV 95.3 09/11/2012 1330   MCH 30.8 09/11/2012 1330   MCHC 32.3 09/11/2012 1330   RDW 13.4 09/11/2012 1330   LYMPHSABS 1.1  09/11/2012 1330   MONOABS 0.6 09/11/2012 1330   EOSABS 0.1 09/11/2012 1330   BASOSABS 0.0 09/11/2012 1330    Lithium Lvl  Date Value Ref Range Status  06/01/2011 0.51 (L) 0.80 - 1.40 mEq/L Final     No results found for: PHENYTOIN, PHENOBARB, VALPROATE, CBMZ   Bone density test osteoporosis  Hips and spine.  Labs from January 6 included unremarkable CBC, comprehensive metabolic test unremarkable except for calcium 10.9 which is stable high.  Creatinine was 0.75 TSH was normal at 2.18, vitamin D was normal at 43.8, li thium level stable at 0.6 urinalysis unremarkable  .res Assessment: Plan:    Bipolar II disorder ([place])  Generalized anxiety disorder  Insomnia due to mental condition   Borderline mild cognitive impairment but she is able to care for herself adequately.  She feels her mood and anxiety and sleep are pretty stable.  She has been on lithium for many years with good response.  Insomnia is managed with medications.  Disc stress of Covid limiting contact with her family.  Seen them 3 times this summer, but has done facetime.  Supportive therapy areound chronic D relationship problems and a neighbor.  Encourage more walking.  Doing better than ever with eating.  Greater than 50% of  30 min face to face time with patient was spent on counseling and coordination of care. Counseled patient regarding potential benefits, risks, and side effects of lithium to include potential risk of lithium affecting thyroid and renal function.  Discussed need for periodic lab monitoring to determine drug level and to assess for  potential adverse effects.  Counseled patient regarding signs and symptoms of lithium toxicity and advised that they notify office immediately or seek urgent medical attention if experiencing these signs and symptoms.  Patient advised to contact office with any questions or concerns.  She has a history of hyperparathyroidism and is aware that lithium can increase calcium  levels.  At her age it would be very risky to attempt to find an alternative mood stabilizer when lithium has been effective.  Alternatives would expose her to different types of side effects such as those with the atypical antipsychotics including tardive dyskinesia.  We discussed the short-term risks associated with benzodiazepines including sedation and increased fall risk among others.  Discussed long-term side effect risk including dependence, potential withdrawal symptoms, and the potential eventual dose-related risk of dementia.  Also discussed concerns about the Benadryl that she is taking at night for sleep which may adversely affect her memory as well.  Her cognition is declining a bit more but she is functional and OK with self care. Disc cognitive risks with OTC sleep aids.  Keep them at a minimum if possible.    No med changes today Continue mirtazapine 7.5 mg hs Lithium 150 mg 1 in the morning and 2 at night Lorazepam Reports stopping bupropion  Rec check labs She says Dr. Elyse Hsu did it lately.  Will request.  This appointment was 15 minutes   FU 6 months.  Lynder Parents, MD, DFAPA   Please see After Visit Summary for patient specific instructions.  No future appointments.  No orders of the defined types were placed in this encounter.     -------------------------------

## 2018-10-16 DIAGNOSIS — Z23 Encounter for immunization: Secondary | ICD-10-CM | POA: Diagnosis not present

## 2018-10-23 ENCOUNTER — Other Ambulatory Visit: Payer: Self-pay

## 2018-10-23 MED ORDER — LITHIUM CARBONATE 150 MG PO CAPS: ORAL_CAPSULE | ORAL | 1 refills | Status: DC

## 2018-11-28 ENCOUNTER — Encounter: Payer: Self-pay | Admitting: Psychiatry

## 2018-11-28 ENCOUNTER — Other Ambulatory Visit: Payer: Self-pay

## 2018-11-28 ENCOUNTER — Ambulatory Visit (INDEPENDENT_AMBULATORY_CARE_PROVIDER_SITE_OTHER): Payer: Medicare Other | Admitting: Psychiatry

## 2018-11-28 DIAGNOSIS — Z79899 Other long term (current) drug therapy: Secondary | ICD-10-CM | POA: Diagnosis not present

## 2018-11-28 DIAGNOSIS — F3181 Bipolar II disorder: Secondary | ICD-10-CM

## 2018-11-28 DIAGNOSIS — F5105 Insomnia due to other mental disorder: Secondary | ICD-10-CM

## 2018-11-28 DIAGNOSIS — F411 Generalized anxiety disorder: Secondary | ICD-10-CM | POA: Diagnosis not present

## 2018-11-28 NOTE — Progress Notes (Signed)
Amy [name] [id number] 1945-03-28 73 y.o.  Virtual Visit via Telephone Note  I connected with pt by telephone and verified that I am speaking with the correct person using two identifiers.   I discussed the limitations, risks, security and privacy concerns of performing an evaluation and management service by telephone and the availability of in person appointments. I also discussed with the patient that there may be a patient responsible charge related to this service. The patient expressed understanding and agreed to proceed.  I discussed the assessment and treatment plan with the patient. The patient was provided an opportunity to ask questions and all were answered. The patient agreed with the plan and demonstrated an understanding of the instructions.   The patient was advised to call back or seek an in-person evaluation if the symptoms worsen or if the condition fails to improve as anticipated.  I provided 30 minutes minutes of non-face-to-face time during this encounter. The call started at 330 and ended at 4:00. The patient was located at home and the provider was located office.  Subjective:   [name] is a 73 y.o. (DOB 09-23-1945) female.  Chief Complaint:  Chief Complaint  Patient presents with  . Follow-up    Medication Management  . Other    Bipolar 2  . Anxiety    Medication Management    Anxiety Symptoms include decreased concentration. Patient reports no confusion, nervous/anxious behavior or suicidal ideas.     Amy [name] presents to the office today for follow-up of mood, anxiety and STM problems.    Last seen October 2020.  At the last visit no medication changes were made.  She moved to this appointment earlier. D in on the appt [name].  Not feeling well for over a week.  Staying with [name] for a week.   Problems with diarrhea.  Using immodium. More tremor and confusion, poor memory, and poor handwriting.  D notes  sleeping a lot and is cold.    Has stayed well re: viral illness.  Has been able to still meet with 8 neighbors and that has continued to help her mental health.    Notices more trouble with word-finding and fluency and that bothers her.    Things better lately with D, this has been the chronic stressor for years.  Doesn't know why but pleased.  Disc other stressors with a recent friend when she tried to reconcile with him and he refused.   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  Struggling to learn a new phone.  Can do Facebook.  Asks about OTC branded supplements for memory.  Patient reports stable mood and denies depressed or irritable moods.  Patient denies any recent difficulty with anxiety.  Patient denies difficulty with sleep initiation or maintenance. Pleased with sleep.  Takes OTC sleep aid. Denies appetite disturbance.  Patient reports that energy and motivation have been good.  Patient denies any difficulty with concentration.  Patient denies any suicidal ideation.  Saw [name] in January and the labs and he had no concerns about her labs.  Normal EKG  Past Psychiatric Medication Trials: Lamotrigine, Depakote, lithium, olanzapine 2.5 Buspar dizzy, Benadryl HS helps sleep, Willbutrin , mirtazapine 7.5, NAC cut out bc NR noted and doesn't like so many pills.  Trazodone,    Review of Systems:  Review of Systems  Musculoskeletal: Positive for arthralgias and back pain.       Sees chiropracter  Neurological: Negative for tremors, weakness and  headaches.       Balance isn't great. No recent falls.  Psychiatric/Behavioral: Positive for decreased concentration. Negative for agitation, behavioral problems, confusion, dysphoric mood, hallucinations, self-injury, sleep disturbance and suicidal ideas. The patient is not nervous/anxious and is not hyperactive.     Medications: I have reviewed the patient's current medications.  Current Outpatient Medications  Medication Sig  Dispense Refill  . Calcium Citrate (CITRACAL PO) Take by mouth 2 (two) times daily.     . cholecalciferol (VITAMIN D) 1000 UNITS tablet Take 1,000 Units by mouth daily. VITAMIN D    . diphenhydramine-acetaminophen (TYLENOL PM) 25-500 MG TABS tablet Take 1 tablet by mouth at bedtime as needed.    [name] estradiol (ESTRACE) 0.1 MG/GM vaginal cream Place 1/2 gram per vagina at bedtime twice weekly. 42.5 g 1  . fluticasone (FLONASE) 50 MCG/ACT nasal spray Place 2 sprays into both nostrils daily.    [name] ketoconazole (NIZORAL) 2 % shampoo WASH with FIVE ML ONCE TO twice APPLY WEEK    . levothyroxine (SYNTHROID, LEVOTHROID) 88 MCG tablet Take 1 tablet (88 mcg total) by mouth daily before breakfast. 30 tablet 11  . lidocaine (XYLOCAINE) 2 % jelly USE AS NEEDED FOR PAINFUL HEMORROIDS THREE TIMES DAILY 30 mL 1  . lithium carbonate 150 MG capsule Take one capsule by mouth every morning and two capsules at bedtime 270 capsule 1  . loratadine (CLARITIN) 10 MG tablet Take 10 mg by mouth daily.    [name] LORazepam (ATIVAN) 0.5 MG tablet Take 2 tablets (1 mg total) by mouth at bedtime. 180 tablet 0  . mirtazapine (REMERON) 15 MG tablet Take 0.5 tablets (7.5 mg total) by mouth at bedtime. 45 tablet 1  . omeprazole (PRILOSEC) 40 MG capsule Take 40 mg by mouth daily.  2  . PREVIDENT 5000 BOOSTER PLUS 1.1 % PSTE APPLY pea-sized AMOUNT TO toothbrush AND BRUSH teeth thoroughly FOR TWO MINUTES. USE DAILY in place of conventional paste    . Wheat Dextrin (BENEFIBER DRINK MIX PO) Take by mouth.     No current facility-administered medications for this visit.     Medication Side Effects: None  Allergies:  Allergies  Allergen Reactions  . Other Other (See Comments)    Allergic rhinitis symptoms     Past Medical History:  Diagnosis Date  . Anxiety   . Bipolar 1 disorder ([place])   . Breast mass, left   . Cancer ([facility])    hx of skin cancer  . Confusion   . Depression   . Fibroid   . GERD (gastroesophageal reflux disease)    . History of colon polyps   . Hyperparathyroidism   . Hypothyroidism   . Leg lesion   . Osteopenia   . Restless leg   . Thyroid disease   . UTI (lower urinary tract infection)     Family History  Problem Relation Age of Onset  . Alzheimer's disease Mother   . Stroke Mother   . Hypertension Mother   . Diabetes Father   . Alzheimer's disease Father   . Cancer Father        colon  . Breast cancer Sister     Social History   Socioeconomic History  . Marital status: Widowed    Spouse name: Not on file  . Number of children: Not on file  . Years of education: Not on file  . Highest education level: Not on file  Occupational History  . Not on file  Social Needs  .  Financial resource strain: Not on file  . Food insecurity    Worry: Not on file    Inability: Not on file  . Transportation needs    Medical: Not on file    Non-medical: Not on file  Tobacco Use  . Smoking status: Never Smoker  . Smokeless tobacco: Never Used  Substance and Sexual Activity  . Alcohol use: No    Alcohol/week: 0.0 standard drinks  . Drug use: No  . Sexual activity: Not Currently    Partners: Male    Birth control/protection: Post-menopausal  Lifestyle  . Physical activity    Days per week: Not on file    Minutes per session: Not on file  . Stress: Not on file  Relationships  . Social Herbalist on phone: Not on file    Gets together: Not on file    Attends religious service: Not on file    Active member of club or organization: Not on file    Attends meetings of clubs or organizations: Not on file    Relationship status: Not on file  . Intimate partner violence    Fear of current or ex partner: Not on file    Emotionally abused: Not on file    Physically abused: Not on file    Forced sexual activity: Not on file  Other Topics Concern  . Not on file  Social History Narrative  . Not on file    Past Medical History, Surgical history, Social history, and Family  history were reviewed and updated as appropriate.   Please see review of systems for further details on the patient's review from today.   Objective:   Physical Exam:  LMP 01/10/2005 (Approximate)   Physical Exam Neurological:     Motor: No tremor.     Gait: Gait normal.  Psychiatric:        Attention and Perception: She is attentive.        Mood and Affect: Mood is not anxious or depressed.        Speech: Speech normal.        Behavior: Behavior normal.        Thought Content: Thought content normal.        Judgment: Judgment normal.     Comments: Neat. Insight and judgment fair. Mild forgetfulness. And a little slow with thought.     Lab Review:     Component Value Date/Time   NA 142 09/11/2012 1330   K 3.6 09/11/2012 1330   CL 107 09/11/2012 1330   CO2 28 09/11/2012 1330   GLUCOSE 96 09/11/2012 1330   BUN 10 09/11/2012 1330   CREATININE 0.55 09/11/2012 1330   CALCIUM 10.8 (H) 09/11/2012 1330   GFRNONAA >90 09/11/2012 1330   GFRAA >90 09/11/2012 1330       Component Value Date/Time   WBC 8.3 09/11/2012 1330   RBC 4.45 09/11/2012 1330   HGB 13.7 09/11/2012 1330   HCT 42.4 09/11/2012 1330   PLT 186 09/11/2012 1330   MCV 95.3 09/11/2012 1330   MCH 30.8 09/11/2012 1330   MCHC 32.3 09/11/2012 1330   RDW 13.4 09/11/2012 1330   LYMPHSABS 1.1 09/11/2012 1330   MONOABS 0.6 09/11/2012 1330   EOSABS 0.1 09/11/2012 1330   BASOSABS 0.0 09/11/2012 1330    Lithium Lvl  Date Value Ref Range Status  06/01/2011 0.51 (L) 0.80 - 1.40 mEq/L Final     No results found for: PHENYTOIN, PHENOBARB, VALPROATE, CBMZ  Bone density test osteoporosis  Hips and spine.  Labs from January 6 included unremarkable CBC, comprehensive metabolic test unremarkable except for calcium 10.9 which is stable high.  Creatinine was 0.75 TSH was normal at 2.18, vitamin D was normal at 43.8, li thium level stable at 0.6 urinalysis unremarkable  .res Assessment: Plan:    Bipolar II disorder  ([place]) - Plan: Lithium level, TSH  Generalized anxiety disorder  Insomnia due to mental condition  Lithium use - Plan: Lithium level, TSH   Borderline mild cognitive impairment but she is able to care for herself adequately.    RO lithium toxicity. Get lthium level tomorrow.  Patient moved the appointment earlier.  She is having symptoms pretty consistent with lithium toxicity but not to a severe degree.  It does not appear that she needs to go to the emergency room.  She reports a gradual worsening of the symptoms noted above over the last month or so.  Daughter has noted it as well.  She will get a lithium level in the morning and that should give guidance as to the next step to take.  The symptoms do not appear related to mood or anxiety.  There is some concern she may have some worsening cognition which may be unrelated to lithium but we will evaluate that once we have a lithium level.  She feels her mood and anxiety and sleep are pretty stable.  She has been on lithium for many years with good response.  Insomnia is managed with medications.  Disc stress of Covid limiting contact with her family.  Seen them 3 times this summer, but has done facetime.  Greater than 50% of  30 min face to face time with patient was spent on counseling and coordination of care. Counseled patient regarding potential benefits, risks, and side effects of lithium to include potential risk of lithium affecting thyroid and renal function.  Discussed need for periodic lab monitoring to determine drug level and to assess for potential adverse effects.  Counseled patient regarding signs and symptoms of lithium toxicity and advised that they notify office immediately or seek urgent medical attention if experiencing these signs and symptoms.  Patient advised to contact office with any questions or concerns.  She has a history of hyperparathyroidism and is aware that lithium can increase calcium levels.  At her age it  would be very risky to attempt to find an alternative mood stabilizer when lithium has been effective.  Alternatives would expose her to different types of side effects such as those with the atypical antipsychotics including tardive dyskinesia.  We discussed the short-term risks associated with benzodiazepines including sedation and increased fall risk among others.  Discussed long-term side effect risk including dependence, potential withdrawal symptoms, and the potential eventual dose-related risk of dementia.  Also discussed concerns about the Benadryl that she is taking at night for sleep which may adversely affect her memory as well.  Her cognition is declining a bit more but she is functional and OK with self care. Disc cognitive risks with OTC sleep aids.  Keep them at a minimum if possible.    No med changes today Continue mirtazapine 7.5 mg hs Lithium 150 mg 1 in the morning and 2 at night Lorazepam Reports stopping bupropion  Rec check labs we will check both lithium and TSH because of feeling cold. There are no recent labs on her paper chart nor available in epic.  FU 6 weeks or as needed  Lynder Parents,  MD, DFAPA   Please see After Visit Summary for patient specific instructions.  Future Appointments  Date Time Provider [place]  04/12/2019 10:00 AM Cottle, Billey Co., MD [facility] None    Orders Placed This Encounter  Procedures  . Lithium level  . TSH      -------------------------------

## 2018-11-29 ENCOUNTER — Telehealth: Payer: Self-pay | Admitting: Psychiatry

## 2018-11-29 DIAGNOSIS — Z79899 Other long term (current) drug therapy: Secondary | ICD-10-CM | POA: Diagnosis not present

## 2018-11-29 DIAGNOSIS — F3181 Bipolar II disorder: Secondary | ICD-10-CM | POA: Diagnosis not present

## 2018-11-30 LAB — TSH: TSH: 1.17 mIU/L (ref 0.40–4.50)

## 2018-11-30 LAB — LITHIUM LEVEL: Lithium Lvl: 0.7 mmol/L (ref 0.6–1.2)

## 2018-12-03 ENCOUNTER — Telehealth: Payer: Self-pay | Admitting: Psychiatry

## 2018-12-03 NOTE — Telephone Encounter (Signed)
Patient notified

## 2018-12-03 NOTE — Telephone Encounter (Signed)
The following is addressed under result notes: Her lithium level is good and no changes indicated.  Her thyroid test is also solidly normal. I wondered if it might be abnormal because she indicated she had symptoms of feeling cold but it is not due to any abnormality in her thyroid.  Please give her this information

## 2018-12-03 NOTE — Progress Notes (Signed)
Patient aware of lab results.

## 2018-12-03 NOTE — Telephone Encounter (Signed)
Checking on lab work from 11/19. It looks like the labs results are in, but not sure enough to tell her. May need adjusted.

## 2018-12-03 NOTE — Progress Notes (Signed)
Pt. Made aware and verbalized understanding.

## 2019-01-11 DIAGNOSIS — L309 Dermatitis, unspecified: Secondary | ICD-10-CM

## 2019-01-11 HISTORY — DX: Dermatitis, unspecified: L30.9

## 2019-01-30 DIAGNOSIS — F319 Bipolar disorder, unspecified: Secondary | ICD-10-CM | POA: Diagnosis not present

## 2019-01-30 DIAGNOSIS — E039 Hypothyroidism, unspecified: Secondary | ICD-10-CM | POA: Diagnosis not present

## 2019-01-30 DIAGNOSIS — J45909 Unspecified asthma, uncomplicated: Secondary | ICD-10-CM | POA: Diagnosis not present

## 2019-01-30 DIAGNOSIS — Z1339 Encounter for screening examination for other mental health and behavioral disorders: Secondary | ICD-10-CM | POA: Diagnosis not present

## 2019-01-30 DIAGNOSIS — I10 Essential (primary) hypertension: Secondary | ICD-10-CM | POA: Diagnosis not present

## 2019-01-30 DIAGNOSIS — Z1331 Encounter for screening for depression: Secondary | ICD-10-CM | POA: Diagnosis not present

## 2019-01-30 DIAGNOSIS — Z0000 Encounter for general adult medical examination without abnormal findings: Secondary | ICD-10-CM | POA: Diagnosis not present

## 2019-01-30 DIAGNOSIS — K219 Gastro-esophageal reflux disease without esophagitis: Secondary | ICD-10-CM | POA: Diagnosis not present

## 2019-01-31 ENCOUNTER — Ambulatory Visit: Payer: Medicare Other | Attending: Internal Medicine

## 2019-01-31 DIAGNOSIS — Z23 Encounter for immunization: Secondary | ICD-10-CM | POA: Insufficient documentation

## 2019-01-31 NOTE — Progress Notes (Signed)
   Covid-19 Vaccination Clinic  Name:  Amy [name]    MRN: [id number] DOB: [birth year]  01/31/2019  [name] was observed post Covid-19 immunization for 15 minutes without incidence. She was provided with Vaccine Information Sheet and instruction to access the V-Safe system.   [name] was instructed to call 911 with any severe reactions post vaccine: [name] Difficulty breathing  . Swelling of your face and throat  . A fast heartbeat  . A bad rash all over your body  . Dizziness and weakness    Immunizations Administered    Name Date Dose VIS Date Route   Pfizer COVID-19 Vaccine 01/31/2019 12:15 PM 0.3 mL 12/21/2018 Intramuscular   Manufacturer: [place]   Lot: [id number]   [place]: [id number]

## 2019-02-01 DIAGNOSIS — F319 Bipolar disorder, unspecified: Secondary | ICD-10-CM | POA: Diagnosis not present

## 2019-02-01 DIAGNOSIS — I10 Essential (primary) hypertension: Secondary | ICD-10-CM | POA: Diagnosis not present

## 2019-02-01 DIAGNOSIS — M810 Age-related osteoporosis without current pathological fracture: Secondary | ICD-10-CM | POA: Diagnosis not present

## 2019-02-01 DIAGNOSIS — Z79899 Other long term (current) drug therapy: Secondary | ICD-10-CM | POA: Diagnosis not present

## 2019-02-01 DIAGNOSIS — E038 Other specified hypothyroidism: Secondary | ICD-10-CM | POA: Diagnosis not present

## 2019-02-05 DIAGNOSIS — Z23 Encounter for immunization: Secondary | ICD-10-CM | POA: Diagnosis not present

## 2019-02-05 DIAGNOSIS — L2084 Intrinsic (allergic) eczema: Secondary | ICD-10-CM | POA: Diagnosis not present

## 2019-02-07 DIAGNOSIS — M810 Age-related osteoporosis without current pathological fracture: Secondary | ICD-10-CM | POA: Diagnosis not present

## 2019-02-07 DIAGNOSIS — E890 Postprocedural hypothyroidism: Secondary | ICD-10-CM | POA: Diagnosis not present

## 2019-02-07 DIAGNOSIS — E210 Primary hyperparathyroidism: Secondary | ICD-10-CM | POA: Diagnosis not present

## 2019-02-14 DIAGNOSIS — M810 Age-related osteoporosis without current pathological fracture: Secondary | ICD-10-CM | POA: Diagnosis not present

## 2019-02-14 DIAGNOSIS — E210 Primary hyperparathyroidism: Secondary | ICD-10-CM | POA: Diagnosis not present

## 2019-02-14 DIAGNOSIS — E890 Postprocedural hypothyroidism: Secondary | ICD-10-CM | POA: Diagnosis not present

## 2019-02-21 ENCOUNTER — Ambulatory Visit: Payer: Medicare Other | Attending: Internal Medicine

## 2019-02-21 DIAGNOSIS — Z23 Encounter for immunization: Secondary | ICD-10-CM

## 2019-02-21 NOTE — Progress Notes (Signed)
   Covid-19 Vaccination Clinic  Name:  Amy [name]    MRN: [id number] DOB: 17-Oct-1945  02/21/2019  [name] was observed post Covid-19 immunization for 15 minutes without incidence. She was provided with Vaccine Information Sheet and instruction to access the V-Safe system.   [name] was instructed to call 911 with any severe reactions post vaccine: [name] Difficulty breathing  . Swelling of your face and throat  . A fast heartbeat  . A bad rash all over your body  . Dizziness and weakness    Immunizations Administered    Name Date Dose VIS Date Route   Pfizer COVID-19 Vaccine 02/21/2019 12:43 PM 0.3 mL 12/21/2018 Intramuscular   Manufacturer: [place]   Lot: [id number]   [place]: [id number]

## 2019-03-06 DIAGNOSIS — N390 Urinary tract infection, site not specified: Secondary | ICD-10-CM | POA: Diagnosis not present

## 2019-03-06 DIAGNOSIS — R300 Dysuria: Secondary | ICD-10-CM | POA: Diagnosis not present

## 2019-03-06 DIAGNOSIS — A499 Bacterial infection, unspecified: Secondary | ICD-10-CM | POA: Diagnosis not present

## 2019-03-11 ENCOUNTER — Telehealth: Payer: Self-pay | Admitting: Psychiatry

## 2019-03-11 NOTE — Telephone Encounter (Signed)
Yes she can try this over-the-counter supplement for memory if she wants.  I do not know if it will be helpful or not but it is okay to try it.

## 2019-03-11 NOTE — Telephone Encounter (Signed)
Pt called and wants to know if she can take over the counter PREVAGEN 50 MCG Capsules. Pt is requesting call back [phone].

## 2019-03-12 NOTE — Telephone Encounter (Signed)
Patient aware.

## 2019-03-22 ENCOUNTER — Other Ambulatory Visit: Payer: Self-pay

## 2019-03-22 MED ORDER — MIRTAZAPINE 15 MG PO TABS: 7.5000 mg | ORAL_TABLET | Freq: Every day | ORAL | 0 refills | Status: DC

## 2019-04-11 ENCOUNTER — Ambulatory Visit: Payer: Medicare Other | Admitting: Psychiatry

## 2019-04-12 ENCOUNTER — Ambulatory Visit: Payer: Medicare Other | Admitting: Psychiatry

## 2019-04-16 DIAGNOSIS — Z79899 Other long term (current) drug therapy: Secondary | ICD-10-CM | POA: Diagnosis not present

## 2019-04-16 DIAGNOSIS — F319 Bipolar disorder, unspecified: Secondary | ICD-10-CM | POA: Diagnosis not present

## 2019-04-16 DIAGNOSIS — I10 Essential (primary) hypertension: Secondary | ICD-10-CM | POA: Diagnosis not present

## 2019-04-16 DIAGNOSIS — R197 Diarrhea, unspecified: Secondary | ICD-10-CM | POA: Diagnosis not present

## 2019-04-19 ENCOUNTER — Other Ambulatory Visit: Payer: Self-pay

## 2019-04-19 MED ORDER — LEVOTHYROXINE SODIUM 88 MCG PO TABS: 88.0000 ug | ORAL_TABLET | Freq: Every day | ORAL | 11 refills | Status: DC

## 2019-04-29 ENCOUNTER — Other Ambulatory Visit: Payer: Self-pay | Admitting: Psychiatry

## 2019-04-29 DIAGNOSIS — F3181 Bipolar II disorder: Secondary | ICD-10-CM | POA: Diagnosis not present

## 2019-04-30 LAB — LITHIUM LEVEL: Lithium Lvl: 0.4 mmol/L — ABNORMAL LOW (ref 0.6–1.2)

## 2019-05-01 ENCOUNTER — Telehealth: Payer: Self-pay | Admitting: Psychiatry

## 2019-05-01 NOTE — Progress Notes (Signed)
There is some mild fluctuation in her lithium level and the current level 0.4 is a little lower than usual but acceptable.  Verify that she is taking lithium 150 mg capsules 1 in the morning and 2 in the evening.Ask if she thinks she has been missing any of it lately.

## 2019-05-01 NOTE — Telephone Encounter (Signed)
There is some mild fluctuation in her lithium level and the current level 0.4 is a little lower than usual but acceptable.  Verify that she is taking lithium 150 mg capsules 1 in the morning and 2 in the evening. Ask if she thinks she has been missing any of it lately.

## 2019-05-01 NOTE — Telephone Encounter (Signed)
Amy [name] called wanting to know the results of her lab tests.  Last time she talked with Traci she mentioned her levels were low.  She's wondering if she should change her dose.  Please call.

## 2019-05-07 ENCOUNTER — Other Ambulatory Visit: Payer: Self-pay

## 2019-05-07 ENCOUNTER — Telehealth: Payer: Self-pay | Admitting: Psychiatry

## 2019-05-07 ENCOUNTER — Other Ambulatory Visit: Payer: Self-pay | Admitting: Psychiatry

## 2019-05-07 MED ORDER — LITHIUM CARBONATE 150 MG PO CAPS: ORAL_CAPSULE | ORAL | 0 refills | Status: DC

## 2019-05-07 NOTE — Telephone Encounter (Signed)
Amy [name] called wanting to have her lithium refilled.  She will talk with Dr. Clovis Pu at appt Friday, but needs to know that her lithium is being refilled.  Please send to [facility].

## 2019-05-07 NOTE — Telephone Encounter (Signed)
Rx refill for Lithium sent

## 2019-05-07 NOTE — Telephone Encounter (Signed)
error 

## 2019-05-07 NOTE — Progress Notes (Signed)
Patient requesting refill of her Lithium be sent to [facility], has apt 05/10/2019

## 2019-05-10 ENCOUNTER — Other Ambulatory Visit: Payer: Self-pay

## 2019-05-10 ENCOUNTER — Encounter: Payer: Self-pay | Admitting: Psychiatry

## 2019-05-10 ENCOUNTER — Ambulatory Visit (INDEPENDENT_AMBULATORY_CARE_PROVIDER_SITE_OTHER): Payer: Medicare Other | Admitting: Psychiatry

## 2019-05-10 DIAGNOSIS — F5105 Insomnia due to other mental disorder: Secondary | ICD-10-CM | POA: Diagnosis not present

## 2019-05-10 DIAGNOSIS — F411 Generalized anxiety disorder: Secondary | ICD-10-CM

## 2019-05-10 DIAGNOSIS — F3181 Bipolar II disorder: Secondary | ICD-10-CM

## 2019-05-10 DIAGNOSIS — Z79899 Other long term (current) drug therapy: Secondary | ICD-10-CM | POA: Diagnosis not present

## 2019-05-10 NOTE — Progress Notes (Signed)
[name] RP:[id number] Mar 22, 1945 74 y.o.   Subjective:   [name] is a 74 y.o. (DOB 09-Jan-1946) female.  Chief Complaint:  Chief Complaint  Patient presents with  . Follow-up    memory and mood  . Memory Loss  . Medication Problem    lithium level questions    Anxiety Symptoms include decreased concentration. Patient reports no confusion, dizziness, nervous/anxious behavior or suicidal ideas.     Amy [name] presents to the office today for follow-up of mood, anxiety and STM problems.    Last seen November 2020.  The following was noted: She moved to this appointment earlier. D in on the appt Amy [name]. Not feeling well for over a week.  Staying with Amy [name] for a week.   Problems with diarrhea.  Using immodium. More tremor and confusion, poor memory, and poor handwriting.  D notes sleeping a lot and is cold.   Has stayed well re: viral illness.  Has been able to still meet with 8 neighbors and that has continued to help her mental health.   Notices more trouble with word-finding and fluency and that bothers her.   Things better lately with D, this has been the chronic stressor for years.  Doesn't know why but pleased.  Disc other stressors with a recent friend when she tried to reconcile with him and he refused. No meds were changed.  05/10/2019 appointment, the following is noted: Disc lithium level variations from November 0.7 to April 0.4 with one in between of 1.7 at [name] office without any changes in dosages.  No explanation for that change. Had been dealing with diarrhea but [name] changed med added Florastor which helped.  Worries about the cat not sleeping with her.   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  Struggling to learn a new phone.  Can do Facebook.  Patient reports stable mood and denies depressed or irritable moods.  Patient denies any recent difficulty with anxiety.  Patient denies difficulty with sleep  initiation or maintenance. Pleased with sleep.  Takes OTC sleep aid. Denies appetite disturbance.  Patient reports that energy and motivation have been good.  Patient denies any difficulty with concentration.  Patient denies any suicidal ideation.  Saw [name] in January and the labs and he had no concerns about her labs.  Normal EKG  Past Psychiatric Medication Trials: Lamotrigine, Depakote, lithium, olanzapine 2.5 Buspar dizzy, Benadryl HS helps sleep, Willbutrin , mirtazapine 7.5, NAC cut out bc NR noted and doesn't like so many pills.  Trazodone,    Review of Systems:  Review of Systems  Gastrointestinal: Negative for diarrhea.  Musculoskeletal: Positive for arthralgias and back pain.       Sees chiropracter  Neurological: Negative for dizziness, tremors, weakness and headaches.       Balance isn't great. No recent falls.  Psychiatric/Behavioral: Positive for decreased concentration. Negative for agitation, behavioral problems, confusion, dysphoric mood, hallucinations, self-injury, sleep disturbance and suicidal ideas. The patient is not nervous/anxious and is not hyperactive.     Medications: I have reviewed the patient's current medications.  Current Outpatient Medications  Medication Sig Dispense Refill  . Apoaequorin (PREVAGEN) 10 MG CAPS Take by mouth.    . Calcium Citrate (CITRACAL PO) Take by mouth 2 (two) times daily.     . cholecalciferol (VITAMIN D) 1000 UNITS tablet Take 1,000 Units by mouth daily. VITAMIN D    . diphenhydramine-acetaminophen (TYLENOL PM) 25-500 MG TABS tablet Take 1 tablet  by mouth at bedtime as needed.    [name] estradiol (ESTRACE) 0.1 MG/GM vaginal cream Place 1/2 gram per vagina at bedtime twice weekly. 42.5 g 1  . fluticasone (FLONASE) 50 MCG/ACT nasal spray Place 2 sprays into both nostrils daily.    [name] ketoconazole (NIZORAL) 2 % shampoo WASH with FIVE ML ONCE TO twice APPLY WEEK    . levothyroxine (SYNTHROID) 88 MCG tablet Take 1 tablet (88 mcg  total) by mouth daily before breakfast. 30 tablet 11  . lidocaine (XYLOCAINE) 2 % jelly USE AS NEEDED FOR PAINFUL HEMORROIDS THREE TIMES DAILY 30 mL 1  . lithium carbonate 150 MG capsule Take one capsule by mouth every morning and two capsules at bedtime 270 capsule 0  . loratadine (CLARITIN) 10 MG tablet Take 10 mg by mouth daily.    [name] LORazepam (ATIVAN) 0.5 MG tablet Take 2 tablets (1 mg total) by mouth at bedtime. (Patient taking differently: Take 0.5 mg by mouth at bedtime. ) 180 tablet 0  . mirtazapine (REMERON) 15 MG tablet Take 0.5 tablets (7.5 mg total) by mouth at bedtime. 45 tablet 0  . omeprazole (PRILOSEC) 40 MG capsule Take 40 mg by mouth daily.  2  . PREVIDENT 5000 BOOSTER PLUS 1.1 % PSTE APPLY pea-sized AMOUNT TO toothbrush AND BRUSH teeth thoroughly FOR TWO MINUTES. USE DAILY in place of conventional paste    . saccharomyces boulardii (FLORASTOR) 250 MG capsule Take 250 mg by mouth 2 (two) times daily.    . Wheat Dextrin (BENEFIBER DRINK MIX PO) Take by mouth.     No current facility-administered medications for this visit.    Medication Side Effects: None  Allergies:  Allergies  Allergen Reactions  . Other Other (See Comments)    Allergic rhinitis symptoms     Past Medical History:  Diagnosis Date  . Anxiety   . Bipolar 1 disorder ([place])   . Breast mass, left   . Cancer (HCC)    hx of skin cancer  . Confusion   . Depression   . Fibroid   . GERD (gastroesophageal reflux disease)   . History of colon polyps   . Hyperparathyroidism   . Hypothyroidism   . Leg lesion   . Osteopenia   . Restless leg   . Thyroid disease   . UTI (lower urinary tract infection)     Family History  Problem Relation Age of Onset  . Alzheimer's disease Mother   . Stroke Mother   . Hypertension Mother   . Diabetes Father   . Alzheimer's disease Father   . Cancer Father        colon  . Breast cancer Sister     Social History   Socioeconomic History  . Marital status:  Widowed    Spouse name: Not on file  . Number of children: Not on file  . Years of education: Not on file  . Highest education level: Not on file  Occupational History  . Not on file  Tobacco Use  . Smoking status: Never Smoker  . Smokeless tobacco: Never Used  Substance and Sexual Activity  . Alcohol use: No    Alcohol/week: 0.0 standard drinks  . Drug use: No  . Sexual activity: Not Currently    Partners: Male    Birth control/protection: Post-menopausal  Other Topics Concern  . Not on file  Social History Narrative  . Not on file   Social Determinants of Health   Financial Resource Strain:   .  Difficulty of Paying Living Expenses:   Food Insecurity:   . Worried About Charity fundraiser in the Last Year:   . Arboriculturist in the Last Year:   Transportation Needs:   . Film/video editor (Medical):   [name] Lack of Transportation (Non-Medical):   Physical Activity:   . Days of Exercise per Week:   . Minutes of Exercise per Session:   Stress:   . Feeling of Stress :   Social Connections:   . Frequency of Communication with Friends and Family:   . Frequency of Social Gatherings with Friends and Family:   . Attends Religious Services:   . Active Member of Clubs or Organizations:   . Attends Archivist Meetings:   [name] Marital Status:   Intimate Partner Violence:   . Fear of Current or Ex-Partner:   . Emotionally Abused:   [name] Physically Abused:   . Sexually Abused:     Past Medical History, Surgical history, Social history, and Family history were reviewed and updated as appropriate.   Please see review of systems for further details on the patient's review from today.   Objective:   Physical Exam:  LMP 01/10/2005 (Approximate)   Physical Exam Constitutional:      General: She is not in acute distress. Musculoskeletal:        General: No deformity.  Neurological:     Mental Status: She is alert and oriented to person, place, and time.     Motor:  No tremor.     Coordination: Coordination normal.     Gait: Gait normal.  Psychiatric:        Attention and Perception: Attention and perception normal. She is attentive. She does not perceive auditory or visual hallucinations.        Mood and Affect: Mood normal. Mood is not anxious or depressed. Affect is not labile, blunt, angry or inappropriate.        Speech: Speech normal.        Behavior: Behavior normal.        Thought Content: Thought content normal. Thought content is not paranoid or delusional. Thought content does not include homicidal or suicidal ideation. Thought content does not include homicidal or suicidal plan.        Cognition and Memory: Cognition and memory normal.        Judgment: Judgment normal.     Comments: Neat. Insight and judgment fair. Mild forgetfulness. Quicker with thought.     Lab Review:     Component Value Date/Time   NA 142 09/11/2012 1330   K 3.6 09/11/2012 1330   CL 107 09/11/2012 1330   CO2 28 09/11/2012 1330   GLUCOSE 96 09/11/2012 1330   BUN 10 09/11/2012 1330   CREATININE 0.55 09/11/2012 1330   CALCIUM 10.8 (H) 09/11/2012 1330   GFRNONAA >90 09/11/2012 1330   GFRAA >90 09/11/2012 1330       Component Value Date/Time   WBC 8.3 09/11/2012 1330   RBC 4.45 09/11/2012 1330   HGB 13.7 09/11/2012 1330   HCT 42.4 09/11/2012 1330   PLT 186 09/11/2012 1330   MCV 95.3 09/11/2012 1330   MCH 30.8 09/11/2012 1330   MCHC 32.3 09/11/2012 1330   RDW 13.4 09/11/2012 1330   LYMPHSABS 1.1 09/11/2012 1330   MONOABS 0.6 09/11/2012 1330   EOSABS 0.1 09/11/2012 1330   BASOSABS 0.0 09/11/2012 1330    Lithium Lvl  Date Value Ref Range Status  04/29/2019  0.4 (L) 0.6 - 1.2 mmol/L Final     No results found for: PHENYTOIN, PHENOBARB, VALPROATE, CBMZ   Bone density test osteoporosis  Hips and spine.  Labs from January 6 included unremarkable CBC, comprehensive metabolic test unremarkable except for calcium 10.9 which is stable high.  Creatinine  was 0.75 TSH was normal at 2.18, vitamin D was normal at 43.8, li thium level stable at 0.6 urinalysis unremarkable  .res Assessment: Plan:    Bipolar II disorder ([place]) - Plan: Lithium level  Generalized anxiety disorder  Insomnia due to mental condition  Lithium use   Borderline mild cognitive impairment but she is able to care for herself adequately.    She feels her mood and anxiety and sleep are pretty stable.  She has been on lithium for many years with good response.  Insomnia is managed with medications.  Greater than 50% of  30 min face to face time with patient was spent on counseling and coordination of care. Counseled patient regarding potential benefits, risks, and side effects of lithium to include potential risk of lithium affecting thyroid and renal function.  Discussed need for periodic lab monitoring to determine drug level and to assess for potential adverse effects.  Counseled patient regarding signs and symptoms of lithium toxicity and advised that they notify office immediately or seek urgent medical attention if experiencing these signs and symptoms.  Patient advised to contact office with any questions or concerns.  She has a history of hyperparathyroidism and is aware that lithium can increase calcium levels.  At her age it would be very risky to attempt to find an alternative mood stabilizer when lithium has been effective.  Alternatives would expose her to different types of side effects such as those with the atypical antipsychotics including tardive dyskinesia.  We discussed the short-term risks associated with benzodiazepines including sedation and increased fall risk among others.  Discussed long-term side effect risk including dependence, potential withdrawal symptoms, and the potential eventual dose-related risk of dementia.  Also discussed concerns about the Benadryl that she is taking at night for sleep which may adversely affect her memory as well.  Her  cognition is declining a bit more but she is functional and OK with self care. Disc cognitive risks with OTC sleep aids.  Keep them at a minimum if possible.    No med changes today indicated Continue mirtazapine 7.5 mg hs Lithium 150 mg 1 in the morning and 2 at night Lorazepam successful reduction to 0.5 mg nightly   checked labs Noted.  No indication for change.    FU 3-4 mos  Lynder Parents, MD, DFAPA   Please see After Visit Summary for patient specific instructions.  No future appointments.  Orders Placed This Encounter  Procedures  . Lithium level      -------------------------------

## 2019-05-10 NOTE — Patient Instructions (Signed)
Before the next appointment check the lithium level either at [facility] or with [name].  Remember to delay taking the morning dose of lithium until after the blood test.

## 2019-05-13 ENCOUNTER — Telehealth: Payer: Self-pay

## 2019-05-13 NOTE — Telephone Encounter (Signed)
Patient called in regards to wanting to schedule mammogram and bone density appointments. Patient stated she spoke with [facility] and they need an order to make appointments.

## 2019-05-13 NOTE — Telephone Encounter (Signed)
Last MMG 10/2017, Birads 2,bengin and B Density AEX 06/2018 with Dr Quincy Simmonds.  Has osteopenia, takes Calcium and Vit D  Spoke with pt. Pt states needing new order at [facility] to have MMG and BMD. Pt states that she needs DX MMG instead of screening due to not having to go back for Dx after screening per Dr Burtram(?spelling) , Radiologist at [facility].(Pt states in chart at [facility]).  Pt advised will review with Dr Quincy Simmonds and obtain orders, then will call and make appt for pt and return call. Pt agreeable.   Routing to Dr Quincy Simmonds. [facility] orders placed on Dr Assurant.

## 2019-05-14 NOTE — Telephone Encounter (Signed)
Spoke with Amy [name] at [place] and verified that pt does not need Dx MMG unless having problems. Pt only needs 3-D tomosynthesis for yearly MMG. Will review and update Dr Quincy Simmonds.   New order placed on Dr Elza Rafter desk for review and signature.   Routing to Dr Quincy Simmonds.

## 2019-05-14 NOTE — Telephone Encounter (Signed)
Spoke to patient. Pt informed of MMG/DEXA scan at [facility] scheduled on 05/23/19 at 1130. Pt agreeable and verbalized understanding.   Routing to Dr Quincy Simmonds for review.  Encounter closed.  Orders faxed to [facility].

## 2019-05-14 NOTE — Telephone Encounter (Signed)
Order is signed and on my desk.

## 2019-05-21 ENCOUNTER — Telehealth: Payer: Self-pay | Admitting: Obstetrics and Gynecology

## 2019-05-21 ENCOUNTER — Ambulatory Visit (INDEPENDENT_AMBULATORY_CARE_PROVIDER_SITE_OTHER): Payer: Medicare Other | Admitting: Obstetrics and Gynecology

## 2019-05-21 ENCOUNTER — Other Ambulatory Visit: Payer: Self-pay

## 2019-05-21 ENCOUNTER — Encounter: Payer: Self-pay | Admitting: Obstetrics and Gynecology

## 2019-05-21 VITALS — Temp 97.6°F | Ht 65.75 in | Wt 130.6 lb

## 2019-05-21 DIAGNOSIS — R350 Frequency of micturition: Secondary | ICD-10-CM

## 2019-05-21 DIAGNOSIS — N3090 Cystitis, unspecified without hematuria: Secondary | ICD-10-CM | POA: Diagnosis not present

## 2019-05-21 DIAGNOSIS — R8290 Unspecified abnormal findings in urine: Secondary | ICD-10-CM

## 2019-05-21 LAB — POCT URINALYSIS DIPSTICK
Bilirubin, UA: NEGATIVE
Glucose, UA: NEGATIVE
Ketones, UA: NEGATIVE
Nitrite, UA: NEGATIVE
Protein, UA: NEGATIVE
Urobilinogen, UA: 0.2 E.U./dL
pH, UA: 7 (ref 5.0–8.0)

## 2019-05-21 MED ORDER — SULFAMETHOXAZOLE-TRIMETHOPRIM 800-160 MG PO TABS
1.0000 | ORAL_TABLET | Freq: Two times a day (BID) | ORAL | 0 refills | Status: DC
Start: 2019-05-21 — End: 2019-08-05

## 2019-05-21 NOTE — Telephone Encounter (Addendum)
AEX 06/2018  Spoke with pt. Pt reports having UTI sx with cloudy urine, urgency, frequency and urine odor x 1 week. Pt also states has small amount of diarrhea when voiding as well. Pt states has seen Dr Reynaldo Minium for this and was given Rx Florastor which has helped some.  Pt states went to [facility] on 03/06/19 for UTI sx and was given Cipro Rx. Pt stating wanting to have UTIs resolved. Pt denies fever, chills, back pain. Pt states is wearing incontinence pads everyday to keep from leaking urine.  Advised pt to be seen for further evaluation. Pt agreeable. Pt scheduled with Dr Quincy Simmonds today 5/11 at 1:15pm as work in. Pt agreeable and verbalized understanding.  CPS Neg  Routing to Dr Quincy Simmonds for review.  Encounter closed.

## 2019-05-21 NOTE — Patient Instructions (Signed)

## 2019-05-21 NOTE — Telephone Encounter (Signed)
Patient feels like she may have a UTI and would an appointment today with Dr.Silva if possible.

## 2019-05-21 NOTE — Progress Notes (Signed)
GYNECOLOGY  VISIT   HPI: 74 y.o.   Widowed  Caucasian  female   [id number] with Patient's last menstrual period was 01/10/2005 (approximate).   here for urinary urgency/frequency and urine odor.  Symptoms started this weekend.  Patient is wearing a pad daily for urinary incontinence since 12/2018. States urine is cloudy.  No dysuria.  Was up all night going to the bathroom.  No back pain.  No nausea or vomiting.  No fever.   Patient states was treated for UTI 03-06-19 at [facility] with Cipro.  No final UC available.  Urine Dip: Mod.WBCs, Trace RBCs  States she is having problems with vomiting off and on since Christmas.  Vomiting.  She is having diarrhea.  She has treated with Imodium, but then stopped.  She thinks diarrhea may be related to her food choices.  She tested negative for C diff with her PCP.  She treated with Floristat. She will follow up with Dr. Earlean Shawl.   GYNECOLOGIC HISTORY: Patient's last menstrual period was 01/10/2005 (approximate). Contraception:  PMP Menopausal hormone therapy: none Last mammogram: 11/09/17 BIRADS 2 benign/density c Last pap smear: 06-28-18 Neg, 02-18-16 Neg, 01-23-14 Neg        OB History    Gravida  2   Para  2   Term      Preterm      AB      Living  2     SAB      TAB      Ectopic      Multiple      Live Births                 Patient Active Problem List   Diagnosis Date Noted  . GAD (generalized anxiety disorder) 11/13/2017    Past Medical History:  Diagnosis Date  . Anxiety   . Bipolar 1 disorder ([place])   . Breast mass, left   . Cancer (HCC)    hx of skin cancer  . Confusion   . Depression   . Fibroid   . GERD (gastroesophageal reflux disease)   . History of colon polyps   . Hyperparathyroidism   . Hypothyroidism   . Leg lesion   . Osteopenia   . Restless leg   . Thyroid disease   . UTI (lower urinary tract infection)     Past Surgical History:  Procedure Laterality Date  . BREAST  LUMPECTOMY WITH RADIOACTIVE SEED LOCALIZATION Left 12/28/2015   Procedure: LEFT BREAST LUMPECTOMY WITH RADIOACTIVE SEED LOCALIZATION;  Surgeon: Coralie Keens, MD;  Location: [facility];  Service: General;  Laterality: Left;  . BREAST SURGERY     breast biopsy ([place])  . DILATION AND CURETTAGE OF UTERUS    . HYSTEROSCOPY WITH D & C  2006   postmenopausal bleeding, endometrial polyp  . LIPOSUCTION TRUNK     and placed fat tissue in vocal cords  . SKIN CANCER EXCISION     hands and thigh  . THYROID SURGERY  2011, 2004   Para Thyroid  . TONSILLECTOMY      Current Outpatient Medications  Medication Sig Dispense Refill  . Apoaequorin (PREVAGEN) 10 MG CAPS Take by mouth.    . Calcium Citrate (CITRACAL PO) Take by mouth 2 (two) times daily.     . Cholecalciferol (VITAMIN D3 PO) Take 100 Units by mouth daily.    . fluticasone (FLONASE) 50 MCG/ACT nasal spray Place 2 sprays into both nostrils daily.    [name]  ketoconazole (NIZORAL) 2 % shampoo WASH with FIVE ML ONCE TO twice APPLY WEEK    . levothyroxine (SYNTHROID) 88 MCG tablet Take 1 tablet (88 mcg total) by mouth daily before breakfast. 30 tablet 11  . lidocaine (XYLOCAINE) 2 % jelly USE AS NEEDED FOR PAINFUL HEMORROIDS THREE TIMES DAILY 30 mL 1  . lithium carbonate 150 MG capsule Take one capsule by mouth every morning and two capsules at bedtime 270 capsule 0  . loratadine (CLARITIN) 10 MG tablet Take 10 mg by mouth daily.    [name] LORazepam (ATIVAN) 0.5 MG tablet Take 2 tablets (1 mg total) by mouth at bedtime. (Patient taking differently: Take 0.5 mg by mouth at bedtime. ) 180 tablet 0  . mirtazapine (REMERON) 15 MG tablet Take 0.5 tablets (7.5 mg total) by mouth at bedtime. 45 tablet 0  . omeprazole (PRILOSEC) 40 MG capsule Take 40 mg by mouth daily.  2  . PREVIDENT 5000 BOOSTER PLUS 1.1 % PSTE APPLY pea-sized AMOUNT TO toothbrush AND BRUSH teeth thoroughly FOR TWO MINUTES. USE DAILY in place of conventional paste    . QC PINK  BISMUTH 262 MG chewable tablet PER pack instructions AS NEEDED diarrhea    . saccharomyces boulardii (FLORASTOR) 250 MG capsule Take 250 mg by mouth 2 (two) times daily.    . Wheat Dextrin (BENEFIBER DRINK MIX PO) Take by mouth.     No current facility-administered medications for this visit.     ALLERGIES: Other  Family History  Problem Relation Age of Onset  . Alzheimer's disease Mother   . Stroke Mother   . Hypertension Mother   . Diabetes Father   . Alzheimer's disease Father   . Cancer Father        colon  . Breast cancer Sister     Social History   Socioeconomic History  . Marital status: Widowed    Spouse name: Not on file  . Number of children: Not on file  . Years of education: Not on file  . Highest education level: Not on file  Occupational History  . Not on file  Tobacco Use  . Smoking status: Never Smoker  . Smokeless tobacco: Never Used  Substance and Sexual Activity  . Alcohol use: No    Alcohol/week: 0.0 standard drinks  . Drug use: No  . Sexual activity: Not Currently    Partners: Male    Birth control/protection: Post-menopausal  Other Topics Concern  . Not on file  Social History Narrative  . Not on file   Social Determinants of Health   Financial Resource Strain:   . Difficulty of Paying Living Expenses:   Food Insecurity:   . Worried About Charity fundraiser in the Last Year:   . Arboriculturist in the Last Year:   Transportation Needs:   . Film/video editor (Medical):   [name] Lack of Transportation (Non-Medical):   Physical Activity:   . Days of Exercise per Week:   . Minutes of Exercise per Session:   Stress:   . Feeling of Stress :   Social Connections:   . Frequency of Communication with Friends and Family:   . Frequency of Social Gatherings with Friends and Family:   . Attends Religious Services:   . Active Member of Clubs or Organizations:   . Attends Archivist Meetings:   [name] Marital Status:   Intimate Partner  Violence:   . Fear of Current or Ex-Partner:   . Emotionally  Abused:   [name] Physically Abused:   . Sexually Abused:     Review of Systems  Gastrointestinal:       Fecal incontinence   Genitourinary: Positive for frequency and urgency.  All other systems reviewed and are negative.   PHYSICAL EXAMINATION:    Temp 97.6 F (36.4 C) (Temporal)   Ht 5' 5.75" (1.67 m)   Wt 130 lb 9.6 oz (59.2 kg)   LMP 01/10/2005 (Approximate)   BMI 21.24 kg/m     General appearance: alert, cooperative and appears stated age   ASSESSMENT  Cystitis.  Urine odor.  Abnormal urine.  Urinary frequency. Diarrhea.   PLAN  Urine micro and cx.  Bactrim DS po bid x 3 days.  Hydrate well.  FU with Dr. Earlean Shawl for further evaluation and tx of diarrhea.   An After Visit Summary was printed and given to the patient.  __15____ minutes face to face time of which over 50% was spent in counseling.

## 2019-05-22 LAB — URINALYSIS, MICROSCOPIC ONLY: Casts: NONE SEEN /lpf

## 2019-05-23 DIAGNOSIS — Z1231 Encounter for screening mammogram for malignant neoplasm of breast: Secondary | ICD-10-CM | POA: Diagnosis not present

## 2019-05-24 LAB — URINE CULTURE

## 2019-05-28 DIAGNOSIS — R197 Diarrhea, unspecified: Secondary | ICD-10-CM | POA: Diagnosis not present

## 2019-06-12 DIAGNOSIS — E210 Primary hyperparathyroidism: Secondary | ICD-10-CM | POA: Diagnosis not present

## 2019-06-12 DIAGNOSIS — F3130 Bipolar disorder, current episode depressed, mild or moderate severity, unspecified: Secondary | ICD-10-CM | POA: Diagnosis not present

## 2019-06-12 DIAGNOSIS — E890 Postprocedural hypothyroidism: Secondary | ICD-10-CM | POA: Diagnosis not present

## 2019-06-13 DIAGNOSIS — L2084 Intrinsic (allergic) eczema: Secondary | ICD-10-CM | POA: Diagnosis not present

## 2019-06-13 DIAGNOSIS — L820 Inflamed seborrheic keratosis: Secondary | ICD-10-CM | POA: Diagnosis not present

## 2019-06-13 DIAGNOSIS — L570 Actinic keratosis: Secondary | ICD-10-CM | POA: Diagnosis not present

## 2019-06-14 DIAGNOSIS — M810 Age-related osteoporosis without current pathological fracture: Secondary | ICD-10-CM | POA: Diagnosis not present

## 2019-06-14 DIAGNOSIS — E210 Primary hyperparathyroidism: Secondary | ICD-10-CM | POA: Diagnosis not present

## 2019-06-14 DIAGNOSIS — E890 Postprocedural hypothyroidism: Secondary | ICD-10-CM | POA: Diagnosis not present

## 2019-06-25 ENCOUNTER — Telehealth: Payer: Self-pay | Admitting: Psychiatry

## 2019-06-25 ENCOUNTER — Other Ambulatory Visit: Payer: Self-pay

## 2019-06-25 MED ORDER — LORAZEPAM 0.5 MG PO TABS: 0.5000 mg | ORAL_TABLET | Freq: Every day | ORAL | 0 refills | Status: DC

## 2019-06-25 MED ORDER — MIRTAZAPINE 15 MG PO TABS: 7.5000 mg | ORAL_TABLET | Freq: Every day | ORAL | 0 refills | Status: DC

## 2019-06-25 NOTE — Telephone Encounter (Signed)
Both Rx's pended for Dr. Clovis Pu to submit

## 2019-06-25 NOTE — Telephone Encounter (Signed)
Patient called and said that Amy [name] needs refills on her lorazapam 0.5 mg and mirtazaoine 15 mg to be sent to [facility]. Amy [name] has an appointment in july

## 2019-07-12 ENCOUNTER — Other Ambulatory Visit: Payer: Self-pay | Admitting: Psychiatry

## 2019-07-12 ENCOUNTER — Telehealth: Payer: Self-pay | Admitting: Psychiatry

## 2019-07-12 DIAGNOSIS — Z79899 Other long term (current) drug therapy: Secondary | ICD-10-CM

## 2019-07-12 DIAGNOSIS — R410 Disorientation, unspecified: Secondary | ICD-10-CM

## 2019-07-12 DIAGNOSIS — F3181 Bipolar II disorder: Secondary | ICD-10-CM

## 2019-07-12 NOTE — Telephone Encounter (Signed)
Daughter called reporting that patient at baseline is somewhat easily confused and has some memory issues but appears to be recently worse over the last couple of weeks.  No other acute distress. Last lithium level was in April and was in the low normal range.  We will repeat lithium level, check urinalysis for UTI, check BMP and TSH.  Order sent to [facility] lab.

## 2019-07-12 NOTE — Progress Notes (Signed)
Daughter called reporting that patient at baseline is somewhat easily confused and has some memory issues but appears to be recently worse over the last couple of weeks.  No other acute distress. Last lithium level was in April and was in the low normal range.  We will repeat lithium level, check urinalysis for UTI, check BMP and TSH.  Order sent to [facility] lab.

## 2019-07-12 NOTE — Telephone Encounter (Signed)
Left detailed message to have her get labs drawn

## 2019-07-12 NOTE — Telephone Encounter (Signed)
Pt daughter called to say that her mother is experiencing confusion and lack of appetite. Please call.

## 2019-07-16 DIAGNOSIS — R410 Disorientation, unspecified: Secondary | ICD-10-CM | POA: Diagnosis not present

## 2019-07-16 DIAGNOSIS — F3181 Bipolar II disorder: Secondary | ICD-10-CM | POA: Diagnosis not present

## 2019-07-16 DIAGNOSIS — Z79899 Other long term (current) drug therapy: Secondary | ICD-10-CM | POA: Diagnosis not present

## 2019-07-17 LAB — URINALYSIS, ROUTINE W REFLEX MICROSCOPIC
Bacteria, UA: NONE SEEN /HPF
Bilirubin Urine: NEGATIVE
Glucose, UA: NEGATIVE
Hgb urine dipstick: NEGATIVE
Hyaline Cast: NONE SEEN /LPF
Ketones, ur: NEGATIVE
Nitrite: NEGATIVE
Protein, ur: NEGATIVE
RBC / HPF: NONE SEEN /HPF (ref 0–2)
Specific Gravity, Urine: 1.007 (ref 1.001–1.03)
Squamous Epithelial / HPF: NONE SEEN /HPF (ref <=5)
pH: 7 (ref 5.0–8.0)

## 2019-07-17 LAB — BASIC METABOLIC PANEL
BUN: 11 mg/dL (ref 7–25)
CO2: 30 mmol/L (ref 20–32)
Calcium: 10.9 mg/dL — ABNORMAL HIGH (ref 8.6–10.4)
Chloride: 106 mmol/L (ref 98–110)
Creat: 0.67 mg/dL (ref 0.60–0.93)
Glucose, Bld: 116 mg/dL (ref 65–139)
Potassium: 3.9 mmol/L (ref 3.5–5.3)
Sodium: 142 mmol/L (ref 135–146)

## 2019-07-17 LAB — TSH: TSH: 0.36 mIU/L — ABNORMAL LOW (ref 0.40–4.50)

## 2019-07-17 LAB — LITHIUM LEVEL: Lithium Lvl: 0.5 mmol/L — ABNORMAL LOW (ref 0.6–1.2)

## 2019-07-17 NOTE — Telephone Encounter (Signed)
Spoke with her daughter about lab results and advised they contact her PCP concerning the possible UTI, she does have apt with him the end of the month also. He should be able to pull up all her labs to review as well, he is in our network.

## 2019-07-17 NOTE — Telephone Encounter (Signed)
Amy [name] called to inquire about her lab results.  She is sure she has a UTI.  Can someone please review the labs and prescribe appropriate medication for her.  Send to [facility]

## 2019-07-17 NOTE — Progress Notes (Signed)
Pt recently a bit more confused than normal per daughter.  Calcium a little higher, TSH is low and looks like may have UTI.  Sending results to PCP.  Please make sure pt gets in touch with PCP Lithium level remains low normal

## 2019-07-31 ENCOUNTER — Other Ambulatory Visit: Payer: Self-pay

## 2019-07-31 MED ORDER — LITHIUM CARBONATE 150 MG PO CAPS: ORAL_CAPSULE | ORAL | 0 refills | Status: DC

## 2019-08-05 ENCOUNTER — Encounter: Payer: Self-pay | Admitting: Psychiatry

## 2019-08-05 ENCOUNTER — Ambulatory Visit (INDEPENDENT_AMBULATORY_CARE_PROVIDER_SITE_OTHER): Payer: Medicare Other | Admitting: Psychiatry

## 2019-08-05 ENCOUNTER — Other Ambulatory Visit: Payer: Self-pay

## 2019-08-05 DIAGNOSIS — F3181 Bipolar II disorder: Secondary | ICD-10-CM | POA: Diagnosis not present

## 2019-08-05 DIAGNOSIS — Z79899 Other long term (current) drug therapy: Secondary | ICD-10-CM | POA: Diagnosis not present

## 2019-08-05 DIAGNOSIS — R410 Disorientation, unspecified: Secondary | ICD-10-CM | POA: Diagnosis not present

## 2019-08-05 DIAGNOSIS — F5105 Insomnia due to other mental disorder: Secondary | ICD-10-CM

## 2019-08-05 DIAGNOSIS — F411 Generalized anxiety disorder: Secondary | ICD-10-CM

## 2019-08-05 NOTE — Progress Notes (Signed)
Amy [name] [id number] July 05, 1945 74 y.o.   Subjective:   [name] is a 74 y.o. (DOB 1945/11/10) female.  Chief Complaint:  Chief Complaint  Patient presents with  . Follow-up  . Memory Loss  . Anxiety  . Depression    Anxiety Symptoms include confusion and decreased concentration. Patient reports no dizziness, nervous/anxious behavior or suicidal ideas.     Amy [name] presents to the office today for follow-up of mood, anxiety and STM problems.    Last seen November 2020.  The following was noted: She moved to this appointment earlier. D in on the appt [name]. Not feeling well for over a week.  Staying with [name] for a week.   Problems with diarrhea.  Using immodium. More tremor and confusion, poor memory, and poor handwriting.  D notes sleeping a lot and is cold.   Has stayed well re: viral illness.  Has been able to still meet with 8 neighbors and that has continued to help her mental health.   Notices more trouble with word-finding and fluency and that bothers her.   Things better lately with D, this has been the chronic stressor for years.  Doesn't know why but pleased.  Disc other stressors with a recent friend when she tried to reconcile with him and he refused. No meds were changed.  05/10/2019 appointment, the following is noted: Disc lithium level variations from November 0.7 to April 0.4 with one in between of 1.7 at [name] office without any changes in dosages.  No explanation for that change. Had been dealing with diarrhea but [name] changed med added Florastor which helped.  Worries about the cat not sleeping with her.   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  Struggling to learn a new phone.  Can do Facebook. No med change.  08/05/19 appt with the following noted: Up and down.  Recent period of confusion.  Had a problem with one of neighbors. Asked for daughter's help and she came by to help. No problems driving.   Word finding issues. Does her own shopping and pays bills. She went for follow up to labs July 6 and did not have UTI. Not anxious since being more social.  Not as easily upset. On same doses of lorazepam 0.5 mg HS, mirtazapine 7.5 mg HS, lithium 150 mg 1 cap AM and 2 HS. Some awakening hungry at night lately.  Patient reports stable mood and denies depressed or irritable moods.  Patient denies any recent difficulty with anxiety.  Patient denies difficulty with sleep initiation or maintenance. Pleased with sleep.  Takes OTC sleep aid. Denies appetite disturbance.  Patient reports that energy and motivation have been good.  Patient worsening difficulty with concentration and memory.  Gets times confused.  No missed appts..  Patient denies any suicidal ideation.  Saw [name] in January and the labs and he had no concerns about her labs.  Normal EKG  Past Psychiatric Medication Trials: Lamotrigine, Depakote, lithium, olanzapine 2.5 Buspar dizzy, Benadryl HS helps sleep, Willbutrin , mirtazapine 7.5, NAC cut out bc NR noted and doesn't like so many pills.  Trazodone,    Review of Systems:  Review of Systems  Gastrointestinal: Negative for diarrhea.  Musculoskeletal: Positive for arthralgias and back pain.       Sees chiropracter  Neurological: Negative for dizziness, tremors, weakness and headaches.       Balance isn't great. No recent falls.  Psychiatric/Behavioral: Positive for confusion and decreased  concentration. Negative for agitation, behavioral problems, dysphoric mood, hallucinations, self-injury, sleep disturbance and suicidal ideas. The patient is not nervous/anxious and is not hyperactive.     Medications: I have reviewed the patient's current medications.  Current Outpatient Medications  Medication Sig Dispense Refill  . Apoaequorin (PREVAGEN) 10 MG CAPS Take by mouth.    . Calcium Citrate (CITRACAL PO) Take by mouth 2 (two) times daily.     . Cholecalciferol (VITAMIN D3  PO) Take 100 Units by mouth daily.    . fluticasone (FLONASE) 50 MCG/ACT nasal spray Place 2 sprays into both nostrils daily.    [name] ketoconazole (NIZORAL) 2 % shampoo WASH with FIVE ML ONCE TO twice APPLY WEEK    . levothyroxine (SYNTHROID) 88 MCG tablet Take 1 tablet (88 mcg total) by mouth daily before breakfast. 30 tablet 11  . lidocaine (XYLOCAINE) 2 % jelly USE AS NEEDED FOR PAINFUL HEMORROIDS THREE TIMES DAILY 30 mL 1  . lithium carbonate 150 MG capsule Take one capsule by mouth every morning and two capsules at bedtime 270 capsule 0  . loratadine (CLARITIN) 10 MG tablet Take 10 mg by mouth daily.    [name] LORazepam (ATIVAN) 0.5 MG tablet Take 1 tablet (0.5 mg total) by mouth at bedtime. 90 tablet 0  . mirtazapine (REMERON) 15 MG tablet Take 0.5 tablets (7.5 mg total) by mouth at bedtime. 45 tablet 0  . omeprazole (PRILOSEC) 40 MG capsule Take 40 mg by mouth daily.  2  . PREVIDENT 5000 BOOSTER PLUS 1.1 % PSTE APPLY pea-sized AMOUNT TO toothbrush AND BRUSH teeth thoroughly FOR TWO MINUTES. USE DAILY in place of conventional paste    . QC PINK BISMUTH 262 MG chewable tablet PER pack instructions AS NEEDED diarrhea    . saccharomyces boulardii (FLORASTOR) 250 MG capsule Take 250 mg by mouth 2 (two) times daily.    . Wheat Dextrin (BENEFIBER DRINK MIX PO) Take by mouth.     No current facility-administered medications for this visit.    Medication Side Effects: None  Allergies:  Allergies  Allergen Reactions  . Other Other (See Comments)    Allergic rhinitis symptoms     Past Medical History:  Diagnosis Date  . Anxiety   . Bipolar 1 disorder ([place])   . Breast mass, left   . Cancer (HCC)    hx of skin cancer  . Confusion   . Depression   . Fibroid   . GERD (gastroesophageal reflux disease)   . History of colon polyps   . Hyperparathyroidism   . Hypothyroidism   . Leg lesion   . Osteopenia   . Restless leg   . Thyroid disease   . UTI (lower urinary tract infection)      Family History  Problem Relation Age of Onset  . Alzheimer's disease Mother   . Stroke Mother   . Hypertension Mother   . Diabetes Father   . Alzheimer's disease Father   . Cancer Father        colon  . Breast cancer Sister     Social History   Socioeconomic History  . Marital status: Widowed    Spouse name: Not on file  . Number of children: Not on file  . Years of education: Not on file  . Highest education level: Not on file  Occupational History  . Not on file  Tobacco Use  . Smoking status: Never Smoker  . Smokeless tobacco: Never Used  Vaping Use  . Vaping  Use: Never used  Substance and Sexual Activity  . Alcohol use: No    Alcohol/week: 0.0 standard drinks  . Drug use: No  . Sexual activity: Not Currently    Partners: Male    Birth control/protection: Post-menopausal  Other Topics Concern  . Not on file  Social History Narrative  . Not on file   Social Determinants of Health   Financial Resource Strain:   . Difficulty of Paying Living Expenses:   Food Insecurity:   . Worried About Charity fundraiser in the Last Year:   . Arboriculturist in the Last Year:   Transportation Needs:   . Film/video editor (Medical):   [name] Lack of Transportation (Non-Medical):   Physical Activity:   . Days of Exercise per Week:   . Minutes of Exercise per Session:   Stress:   . Feeling of Stress :   Social Connections:   . Frequency of Communication with Friends and Family:   . Frequency of Social Gatherings with Friends and Family:   . Attends Religious Services:   . Active Member of Clubs or Organizations:   . Attends Archivist Meetings:   [name] Marital Status:   Intimate Partner Violence:   . Fear of Current or Ex-Partner:   . Emotionally Abused:   [name] Physically Abused:   . Sexually Abused:     Past Medical History, Surgical history, Social history, and Family history were reviewed and updated as appropriate.   Please see review of systems for  further details on the patient's review from today.   Objective:   Physical Exam:  LMP 01/10/2005 (Approximate)   Physical Exam Constitutional:      General: She is not in acute distress. Musculoskeletal:        General: No deformity.  Neurological:     Mental Status: She is alert and oriented to person, place, and time.     Motor: No tremor.     Coordination: Coordination normal.     Gait: Gait normal.  Psychiatric:        Attention and Perception: Attention and perception normal. She is attentive. She does not perceive auditory or visual hallucinations.        Mood and Affect: Mood is anxious. Mood is not depressed. Affect is not labile, blunt, angry or inappropriate.        Speech: Speech normal.        Behavior: Behavior normal.        Thought Content: Thought content normal. Thought content is not paranoid or delusional. Thought content does not include homicidal or suicidal ideation. Thought content does not include homicidal or suicidal plan.        Cognition and Memory: Memory normal. Cognition is impaired.        Judgment: Judgment normal.     Comments: Neat. Insight and judgment fair. Mild forgetfulness and cognitive problems ongoing. .     Lab Review:     Component Value Date/Time   NA 142 07/16/2019 1453   K 3.9 07/16/2019 1453   CL 106 07/16/2019 1453   CO2 30 07/16/2019 1453   GLUCOSE 116 07/16/2019 1453   BUN 11 07/16/2019 1453   CREATININE 0.67 07/16/2019 1453   CALCIUM 10.9 (H) 07/16/2019 1453   GFRNONAA >90 09/11/2012 1330   GFRAA >90 09/11/2012 1330       Component Value Date/Time   WBC 8.3 09/11/2012 1330   RBC 4.45 09/11/2012 1330   HGB  13.7 09/11/2012 1330   HCT 42.4 09/11/2012 1330   PLT 186 09/11/2012 1330   MCV 95.3 09/11/2012 1330   MCH 30.8 09/11/2012 1330   MCHC 32.3 09/11/2012 1330   RDW 13.4 09/11/2012 1330   LYMPHSABS 1.1 09/11/2012 1330   MONOABS 0.6 09/11/2012 1330   EOSABS 0.1 09/11/2012 1330   BASOSABS 0.0 09/11/2012 1330     Lithium Lvl  Date Value Ref Range Status  07/16/2019 0.5 (L) 0.6 - 1.2 mmol/L Final     No results found for: PHENYTOIN, PHENOBARB, VALPROATE, CBMZ   Bone density test osteoporosis  Hips and spine.  Labs from January 6 included unremarkable CBC, comprehensive metabolic test unremarkable except for calcium 10.9 which is stable high.  Creatinine was 0.75 TSH was normal at 2.18, vitamin D was normal at 43.8, li thium level stable at 0.6 urinalysis unremarkable  .res Assessment: Plan:    Bipolar II disorder ([place])  Disorientation  Generalized anxiety disorder  Insomnia due to mental condition  Lithium use   Borderline mild cognitive impairment but she is able to care for herself adequately. Episodically worse without known triggers.   She feels her mood and anxiety and sleep are pretty stable.  She has been on lithium for many years with good response.  Insomnia is managed with medications at [facility].  Greater than 50% of  30 min face to face time with patient was spent on counseling and coordination of care. Counseled patient regarding potential benefits, risks, and side effects of lithium to include potential risk of lithium affecting thyroid and renal functon.  Discussed need for periodic lab monitoring to determine drug level and to assess for potential adverse effects.  Counseled patient regarding signs and symptoms of lithium toxicity and advised that they notify office immediately or seek urgent medical attention if experiencing these signs and symptoms.  Patient advised to contact office with any questions or concerns.  She has a history of hyperparathyroidism and is aware that lithium can increase calcium levels.  At her age it would be very risky to attempt to find an alternative mood stabilizer when lithium has been effective.  Alternatives would expose her to different types of side effects such as those with the atypical antipsychotics including tardive dyskinesia.  We  discussed the short-term risks associated with benzodiazepines including sedation and increased fall risk among others.  Discussed long-term side effect risk including dependence, potential withdrawal symptoms, and the potential eventual dose-related risk of dementia.  Also discussed concerns about the Benadryl that she is taking at night for sleep which may adversely affect her memory as well.  Her cognition is declining a bit more but she is functional and OK with self care. Disc cognitive risks with OTC sleep aids.  Keep them at a minimum if possible.    No med changes today indicated Continue mirtazapine 7.5 mg hs Lithium 150 mg 1 in the morning and 2 at night Lorazepam successful reduction to 0.5 mg nightly  checked labs Noted.  Lithium level remains low normal 0.5 from labs done early July.  TSH was a little low and calcium levels were a little higher.  It looks like the patient might of had a UTI and the labs were forwarded to her PCP and patient was encouraged to get in touch with PCP.  She had a recent UTI in May.     FU 3-4 mos  Lynder Parents, MD, DFAPA   Please see After Visit Summary for patient specific instructions.  Future Appointments  Date Time Provider [place]  08/19/2019 10:30 AM Nunzio Cobbs, MD [place] None  11/27/2019 11:30 AM Cottle, Billey Co., MD [facility] None    No orders of the defined types were placed in this encounter.     -------------------------------

## 2019-08-07 DIAGNOSIS — I10 Essential (primary) hypertension: Secondary | ICD-10-CM | POA: Diagnosis not present

## 2019-08-07 DIAGNOSIS — R300 Dysuria: Secondary | ICD-10-CM | POA: Diagnosis not present

## 2019-08-07 DIAGNOSIS — R82998 Other abnormal findings in urine: Secondary | ICD-10-CM | POA: Diagnosis not present

## 2019-08-14 DIAGNOSIS — M810 Age-related osteoporosis without current pathological fracture: Secondary | ICD-10-CM | POA: Diagnosis not present

## 2019-08-14 DIAGNOSIS — E039 Hypothyroidism, unspecified: Secondary | ICD-10-CM | POA: Diagnosis not present

## 2019-08-14 DIAGNOSIS — F319 Bipolar disorder, unspecified: Secondary | ICD-10-CM | POA: Diagnosis not present

## 2019-08-14 DIAGNOSIS — E559 Vitamin D deficiency, unspecified: Secondary | ICD-10-CM | POA: Diagnosis not present

## 2019-08-14 DIAGNOSIS — G250 Essential tremor: Secondary | ICD-10-CM | POA: Diagnosis not present

## 2019-08-14 DIAGNOSIS — I10 Essential (primary) hypertension: Secondary | ICD-10-CM | POA: Diagnosis not present

## 2019-08-14 DIAGNOSIS — K219 Gastro-esophageal reflux disease without esophagitis: Secondary | ICD-10-CM | POA: Diagnosis not present

## 2019-08-14 DIAGNOSIS — J309 Allergic rhinitis, unspecified: Secondary | ICD-10-CM | POA: Diagnosis not present

## 2019-08-15 NOTE — Progress Notes (Signed)
74 y.o. G2P2 Widowed Caucasian female here for annual exam.    Received Covid vaccine.   Had a recent urine check at [facility], and this was normal. She had more urinary leakage, and she is wearing pads.  This prompted her urine to be checked.  She declines physical therapy.  She is satisfied with wearing pads.   Feels like her abdomen is protruding.  No current pain.  No bleeding.  Normal BM function with use of Metamucil.   PCP: Burnard Bunting, MD  Patient's last menstrual period was 01/10/2005 (approximate).           Sexually active: No.  The current method of family planning is post menopausal status.    Exercising: No.  The patient does not participate in regular exercise at present. Smoker:  no  Health Maintenance: Pap: 06-28-18 Neg, 02-18-16 Neg, 01-23-14 Neg History of abnormal Pap:  no MMG:  Pt.states up to date with [facility]--called for report.  05/23/2019 - BI-RADS2.  11/09/17 BIRADS 2 benign/density c Colonoscopy:  02/2017 Polyps; f/u 5 years  BMD: 2019 Result :Osteoporosis. Followed by Dr.[name] TDaP:  2016 Gardasil:   no HIV:no Hep C: no Screening Labs:  PCP.     reports that she has never smoked. She has never used smokeless tobacco. She reports current alcohol use of about 1.0 standard drink of alcohol per week. She reports that she does not use drugs.  Past Medical History:  Diagnosis Date  . Anxiety   . Bipolar 1 disorder ([place])   . Breast mass, left   . Cancer (HCC)    hx of skin cancer  . Confusion   . Depression   . Eczema 2021  . Fibroid   . GERD (gastroesophageal reflux disease)   . History of colon polyps   . Hyperparathyroidism   . Hypothyroidism   . Leg lesion   . Osteopenia   . Restless leg   . Thyroid disease   . UTI (lower urinary tract infection)     Past Surgical History:  Procedure Laterality Date  . BREAST LUMPECTOMY WITH RADIOACTIVE SEED LOCALIZATION Left 12/28/2015   Procedure: LEFT BREAST LUMPECTOMY WITH RADIOACTIVE  SEED LOCALIZATION;  Surgeon: Coralie Keens, MD;  Location: [facility];  Service: General;  Laterality: Left;  . BREAST SURGERY     breast biopsy (Rt)  . DILATION AND CURETTAGE OF UTERUS    . HYSTEROSCOPY WITH D & C  2006   postmenopausal bleeding, endometrial polyp  . LIPOSUCTION TRUNK     and placed fat tissue in vocal cords  . SKIN CANCER EXCISION     hands and thigh  . THYROID SURGERY  2011, 2004   Para Thyroid  . TONSILLECTOMY      Current Outpatient Medications  Medication Sig Dispense Refill  . Calcium Citrate (CITRACAL PO) Take by mouth 2 (two) times daily.     . Cholecalciferol 25 MCG (1000 UT) capsule Take 1 tablet by mouth every other day.    . fluticasone (FLONASE) 50 MCG/ACT nasal spray Place 2 sprays into both nostrils daily.    [name] ketoconazole (NIZORAL) 2 % shampoo WASH with FIVE ML ONCE TO twice APPLY WEEK    . levothyroxine (SYNTHROID) 88 MCG tablet Take 1 tablet (88 mcg total) by mouth daily before breakfast. 30 tablet 11  . lidocaine (XYLOCAINE) 2 % jelly USE AS NEEDED FOR PAINFUL HEMORROIDS THREE TIMES DAILY 30 mL 1  . lithium carbonate 150 MG capsule Take one capsule  by mouth every morning and two capsules at bedtime 270 capsule 0  . loratadine (CLARITIN) 10 MG tablet Take 10 mg by mouth daily.    [name] LORazepam (ATIVAN) 0.5 MG tablet Take 1 tablet (0.5 mg total) by mouth at bedtime. 90 tablet 0  . mirtazapine (REMERON) 15 MG tablet Take 0.5 tablets (7.5 mg total) by mouth at bedtime. 45 tablet 0  . omeprazole (PRILOSEC) 40 MG capsule Take 40 mg by mouth daily.  2  . PREVIDENT 5000 BOOSTER PLUS 1.1 % PSTE APPLY pea-sized AMOUNT TO toothbrush AND BRUSH teeth thoroughly FOR TWO MINUTES. USE DAILY in place of conventional paste    . psyllium (METAMUCIL) 58.6 % packet Take by mouth.    . QC PINK BISMUTH 262 MG chewable tablet PER pack instructions AS NEEDED diarrhea    . saccharomyces boulardii (FLORASTOR) 250 MG capsule Take 250 mg by mouth 2 (two) times  daily.     No current facility-administered medications for this visit.    Family History  Problem Relation Age of Onset  . Alzheimer's disease Mother   . Stroke Mother   . Hypertension Mother   . Diabetes Father   . Alzheimer's disease Father   . Cancer Father        colon  . Breast cancer Sister     Review of Systems  All other systems reviewed and are negative.   Exam:   BP (!) 148/72   Pulse 88   Resp (!) 22   Ht 5\' 5"  (1.651 m)   Wt 132 lb 3.2 oz (60 kg)   LMP 01/10/2005 (Approximate)   BMI 22.00 kg/m     General appearance: alert, cooperative and appears stated age Head: normocephalic, without obvious abnormality, atraumatic Neck: no adenopathy, supple, symmetrical, trachea midline and thyroid normal to inspection and palpation Lungs: clear to auscultation bilaterally Breasts: normal appearance, no masses or tenderness, No nipple retraction or dimpling, No nipple discharge or bleeding, No axillary adenopathy Heart: regular rate and rhythm Abdomen: soft, non-tender; no masses, no organomegaly Extremities: extremities normal, atraumatic, no cyanosis or edema Skin: skin color, texture, turgor normal. No rashes or lesions Lymph nodes: cervical, supraclavicular, and axillary nodes normal. Neurologic: grossly normal  Pelvic: External genitalia:  no lesions              No abnormal inguinal nodes palpated.              Urethra:  normal appearing urethra with no masses, tenderness or lesions              Bartholins and Skenes: normal                 Vagina: normal appearing vagina with normal color and discharge, no lesions              Cervix: no lesions              Pap taken: No. Bimanual Exam:  Uterus:  normal size, contour, position, consistency, mobility, non-tender              Adnexa: no mass, fullness, tenderness              Rectal exam: Yes.  .  Confirms.              Anus:  normal sphincter tone, no lesions  Chaperone was present for  exam.  Assessment:   Well woman visit with normal exam. Hx left breast mass. Benign biopsy.  FH of twin sister with breast cancer.  FH of colon cancer.  Osteoporosis. Endocrinology managing. Hyperparathyroidism. Stress incontinence. Status post hemorrhoidal banding.  Plan: Mammogram screening discussed. Self breast awareness reviewed. Pap and HR HPV as above. Guidelines for Calcium, Vitamin D, regular exercise program including cardiovascular and weight bearing exercise. BMD this year.  Declines tx for urinary incontinence.  Follow up annually and prn.   After visit summary provided.

## 2019-08-19 ENCOUNTER — Encounter: Payer: Self-pay | Admitting: Obstetrics and Gynecology

## 2019-08-19 ENCOUNTER — Ambulatory Visit (INDEPENDENT_AMBULATORY_CARE_PROVIDER_SITE_OTHER): Payer: Medicare Other | Admitting: Obstetrics and Gynecology

## 2019-08-19 ENCOUNTER — Other Ambulatory Visit: Payer: Self-pay

## 2019-08-19 VITALS — BP 148/72 | HR 88 | Resp 22 | Ht 65.0 in | Wt 132.2 lb

## 2019-08-19 DIAGNOSIS — Z01419 Encounter for gynecological examination (general) (routine) without abnormal findings: Secondary | ICD-10-CM | POA: Diagnosis not present

## 2019-08-19 DIAGNOSIS — Z124 Encounter for screening for malignant neoplasm of cervix: Secondary | ICD-10-CM

## 2019-08-29 DIAGNOSIS — M25551 Pain in right hip: Secondary | ICD-10-CM | POA: Diagnosis not present

## 2019-08-29 DIAGNOSIS — M8580 Other specified disorders of bone density and structure, unspecified site: Secondary | ICD-10-CM | POA: Diagnosis not present

## 2019-09-13 ENCOUNTER — Encounter: Payer: Self-pay | Admitting: Obstetrics and Gynecology

## 2019-09-18 ENCOUNTER — Other Ambulatory Visit: Payer: Self-pay

## 2019-09-18 MED ORDER — MIRTAZAPINE 15 MG PO TABS: 7.5000 mg | ORAL_TABLET | Freq: Every day | ORAL | 0 refills | Status: DC

## 2019-09-19 DIAGNOSIS — L570 Actinic keratosis: Secondary | ICD-10-CM | POA: Diagnosis not present

## 2019-09-19 DIAGNOSIS — L309 Dermatitis, unspecified: Secondary | ICD-10-CM | POA: Diagnosis not present

## 2019-09-19 DIAGNOSIS — D235 Other benign neoplasm of skin of trunk: Secondary | ICD-10-CM | POA: Diagnosis not present

## 2019-09-23 ENCOUNTER — Other Ambulatory Visit: Payer: Self-pay

## 2019-09-23 ENCOUNTER — Telehealth: Payer: Self-pay | Admitting: Psychiatry

## 2019-09-23 MED ORDER — LORAZEPAM 0.5 MG PO TABS: 0.5000 mg | ORAL_TABLET | Freq: Every day | ORAL | 0 refills | Status: DC

## 2019-09-23 NOTE — Telephone Encounter (Signed)
[facility] called requesting refill for Lorazepam 0.5 mg tab. Pt completely out and asking to please fill today if at all possible. Pharmacy will deliver. Apt 11/17

## 2019-09-23 NOTE — Telephone Encounter (Signed)
Rx pended for Dr. Clovis Pu to send 90 day supply.

## 2019-09-30 DIAGNOSIS — Z23 Encounter for immunization: Secondary | ICD-10-CM | POA: Diagnosis not present

## 2019-10-07 DIAGNOSIS — L308 Other specified dermatitis: Secondary | ICD-10-CM | POA: Diagnosis not present

## 2019-10-07 DIAGNOSIS — L309 Dermatitis, unspecified: Secondary | ICD-10-CM | POA: Diagnosis not present

## 2019-10-08 ENCOUNTER — Telehealth: Payer: Self-pay

## 2019-10-08 NOTE — Telephone Encounter (Signed)
Patient is having diarrhea and she does not remember if Dr Quincy Simmonds or her pcp wrote a prescription.

## 2019-10-08 NOTE — Telephone Encounter (Signed)
Spoke with patient. Patient reports diarrhea for 3 weeks, has been increasing in frequency over the past week. One episode of vomiting when diarrhea started, nausea last night. Denies fever/chills or pain. Abdomen is soft. Denies any GYN symptoms. Patient states she has been taking pink bismuth previously prescribed by PCP, no change in symptoms. Per review of Epic, patient was previously on Florastor prescribed by GI. She was evaluated in 05/2019 by GI for diarrhea.  Advised patient to f/u with PCP or Dr. Morene Rankins for further evaluation. If unable to be seen by PCP or GI, seek evaluation at local Urgent Care/ER if symptoms worsen or new symptoms develop. Provided contact information to Dr. Earlean Shawl [phone].   Patient verbalizes understanding and is agreeable.   Routing to provider for final review. Patient is agreeable to disposition. Will close encounter.

## 2019-10-14 DIAGNOSIS — Z23 Encounter for immunization: Secondary | ICD-10-CM | POA: Diagnosis not present

## 2019-10-14 DIAGNOSIS — E210 Primary hyperparathyroidism: Secondary | ICD-10-CM | POA: Diagnosis not present

## 2019-10-14 DIAGNOSIS — E890 Postprocedural hypothyroidism: Secondary | ICD-10-CM | POA: Diagnosis not present

## 2019-10-16 DIAGNOSIS — F319 Bipolar disorder, unspecified: Secondary | ICD-10-CM | POA: Diagnosis not present

## 2019-10-16 DIAGNOSIS — M810 Age-related osteoporosis without current pathological fracture: Secondary | ICD-10-CM | POA: Diagnosis not present

## 2019-10-16 DIAGNOSIS — E210 Primary hyperparathyroidism: Secondary | ICD-10-CM | POA: Diagnosis not present

## 2019-10-16 DIAGNOSIS — E890 Postprocedural hypothyroidism: Secondary | ICD-10-CM | POA: Diagnosis not present

## 2019-10-17 DIAGNOSIS — R140 Abdominal distension (gaseous): Secondary | ICD-10-CM | POA: Diagnosis not present

## 2019-10-17 DIAGNOSIS — R152 Fecal urgency: Secondary | ICD-10-CM | POA: Diagnosis not present

## 2019-10-17 DIAGNOSIS — R112 Nausea with vomiting, unspecified: Secondary | ICD-10-CM | POA: Diagnosis not present

## 2019-10-17 DIAGNOSIS — Z800 Family history of malignant neoplasm of digestive organs: Secondary | ICD-10-CM | POA: Diagnosis not present

## 2019-10-17 DIAGNOSIS — K219 Gastro-esophageal reflux disease without esophagitis: Secondary | ICD-10-CM | POA: Diagnosis not present

## 2019-10-17 DIAGNOSIS — R197 Diarrhea, unspecified: Secondary | ICD-10-CM | POA: Diagnosis not present

## 2019-10-21 DIAGNOSIS — Z4802 Encounter for removal of sutures: Secondary | ICD-10-CM | POA: Diagnosis not present

## 2019-10-21 DIAGNOSIS — L2084 Intrinsic (allergic) eczema: Secondary | ICD-10-CM | POA: Diagnosis not present

## 2019-10-23 DIAGNOSIS — R197 Diarrhea, unspecified: Secondary | ICD-10-CM | POA: Diagnosis not present

## 2019-10-24 DIAGNOSIS — R197 Diarrhea, unspecified: Secondary | ICD-10-CM | POA: Diagnosis not present

## 2019-10-30 ENCOUNTER — Other Ambulatory Visit: Payer: Self-pay

## 2019-10-30 ENCOUNTER — Telehealth: Payer: Self-pay | Admitting: Psychiatry

## 2019-10-30 MED ORDER — LITHIUM CARBONATE 150 MG PO CAPS: ORAL_CAPSULE | ORAL | 0 refills | Status: DC

## 2019-10-30 NOTE — Telephone Encounter (Signed)
[facility] is calling to get a RF on pt's Lithium Carbonate.

## 2019-10-30 NOTE — Telephone Encounter (Signed)
Refill sent for Lithium

## 2019-10-31 ENCOUNTER — Other Ambulatory Visit: Payer: Self-pay

## 2019-10-31 MED ORDER — LORAZEPAM 0.5 MG PO TABS: 0.5000 mg | ORAL_TABLET | Freq: Every day | ORAL | 0 refills | Status: DC

## 2019-10-31 MED ORDER — MIRTAZAPINE 15 MG PO TABS: 7.5000 mg | ORAL_TABLET | Freq: Every day | ORAL | 0 refills | Status: DC

## 2019-11-13 DIAGNOSIS — M810 Age-related osteoporosis without current pathological fracture: Secondary | ICD-10-CM | POA: Diagnosis not present

## 2019-11-18 DIAGNOSIS — L309 Dermatitis, unspecified: Secondary | ICD-10-CM | POA: Diagnosis not present

## 2019-11-18 DIAGNOSIS — L308 Other specified dermatitis: Secondary | ICD-10-CM | POA: Diagnosis not present

## 2019-11-18 DIAGNOSIS — L821 Other seborrheic keratosis: Secondary | ICD-10-CM | POA: Diagnosis not present

## 2019-11-18 DIAGNOSIS — L853 Xerosis cutis: Secondary | ICD-10-CM | POA: Diagnosis not present

## 2019-11-19 ENCOUNTER — Telehealth: Payer: Self-pay | Admitting: Obstetrics and Gynecology

## 2019-11-19 NOTE — Telephone Encounter (Signed)
Please contact patient to see if she received results from her BMD at [facility] on 11/13/19.   She has osteoporosis of the hip and spine, which is stable for her.   Dr. Elyse Hsu is managing her osteoporosis.   I believe he has received a copy of this report.

## 2019-11-19 NOTE — Telephone Encounter (Signed)
Spoke with patient and notified of BMD results. Patient voiced understanding. She thanked me for calling.

## 2019-11-26 ENCOUNTER — Encounter: Payer: Self-pay | Admitting: Obstetrics and Gynecology

## 2019-11-27 ENCOUNTER — Ambulatory Visit (INDEPENDENT_AMBULATORY_CARE_PROVIDER_SITE_OTHER): Payer: Medicare Other | Admitting: Psychiatry

## 2019-11-27 ENCOUNTER — Other Ambulatory Visit: Payer: Self-pay

## 2019-11-27 ENCOUNTER — Encounter: Payer: Self-pay | Admitting: Psychiatry

## 2019-11-27 DIAGNOSIS — F3181 Bipolar II disorder: Secondary | ICD-10-CM

## 2019-11-27 DIAGNOSIS — F5105 Insomnia due to other mental disorder: Secondary | ICD-10-CM | POA: Diagnosis not present

## 2019-11-27 DIAGNOSIS — Z79899 Other long term (current) drug therapy: Secondary | ICD-10-CM

## 2019-11-27 DIAGNOSIS — F411 Generalized anxiety disorder: Secondary | ICD-10-CM | POA: Diagnosis not present

## 2019-11-27 MED ORDER — LITHIUM CARBONATE 150 MG PO CAPS: ORAL_CAPSULE | ORAL | 0 refills | Status: DC

## 2019-11-27 MED ORDER — LORAZEPAM 0.5 MG PO TABS: 0.5000 mg | ORAL_TABLET | Freq: Every day | ORAL | 0 refills | Status: DC

## 2019-11-27 MED ORDER — MIRTAZAPINE 15 MG PO TABS: 7.5000 mg | ORAL_TABLET | Freq: Every day | ORAL | 0 refills | Status: DC

## 2019-11-27 NOTE — Progress Notes (Signed)
Amy [name] [id number] February 20, 1945 74 y.o.   Subjective:   [name] is a 74 y.o. (DOB [birth year]) female.  Chief Complaint:  Chief Complaint  Patient presents with  . Follow-up    mood and meds  . Anxiety    Anxiety Symptoms include confusion, decreased concentration and nausea. Patient reports no dizziness, nervous/anxious behavior or suicidal ideas.     Amy [name] presents to the office today for follow-up of mood, anxiety and STM problems.    Last seen November 2020.  The following was noted: She moved to this appointment earlier. D in on the appt [name]. Not feeling well for over a week.  Staying with [name] for a week.   Problems with diarrhea.  Using immodium. More tremor and confusion, poor memory, and poor handwriting.  D notes sleeping a lot and is cold.   Has stayed well re: viral illness.  Has been able to still meet with 8 neighbors and that has continued to help her mental health.   Notices more trouble with word-finding and fluency and that bothers her.   Things better lately with D, this has been the chronic stressor for years.  Doesn't know why but pleased.  Disc other stressors with a recent friend when she tried to reconcile with him and he refused. No meds were changed.  05/10/2019 appointment, the following is noted: Disc lithium level variations from November 0.7 to April 0.4 with one in between of 1.7 at [name] office without any changes in dosages.  No explanation for that change. Had been dealing with diarrhea but [name] changed med added Florastor which helped.  Worries about the cat not sleeping with her.   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  Struggling to learn a new phone.  Can do Facebook. No med change.  08/05/19 appt with the following noted: Up and down.  Recent period of confusion.  Had a problem with one of neighbors. Asked for daughter's help and she came by to help. No problems driving.   Word finding issues. Does her own shopping and pays bills. She went for follow up to labs July 6 and did not have UTI. Not anxious since being more social.  Not as easily upset. On same doses of lorazepam 0.5 mg HS, mirtazapine 7.5 mg HS, lithium 150 mg 1 cap AM and 2 HS. Some awakening hungry at night lately. Plan: no med changes  11/27/19 appt with following noted: Problems with GI vomiting diarrhea, skin problems with med changes.  Skin problem is better some with topicals.  N and diarrhea might be stress related bc is better now that keeps her distance from stressful neighbor.   They wondered about her stopping all meds to reevaluate. On same doses of lorazepam 0.5 mg HS, mirtazapine 7.5 mg HS, lithium 150 mg 1 cap AM and 2 HS. Sleep is good with meds.     Patient reports stable mood and denies depressed or irritable moods.  Patient denies any recent difficulty with anxiety.  Patient denies difficulty with sleep initiation or maintenance. Pleased with sleep.  Takes OTC sleep aid. Denies appetite disturbance.  Patient reports that energy and motivation have been good.  Patient worsening difficulty with concentration and memory.  Gets times confused.  No missed appts..  Patient denies any suicidal ideation.  Saw [name] in January and the labs and he had no concerns about her labs.  Normal EKG  Past Psychiatric Medication Trials: Lamotrigine,  Depakote, lithium, olanzapine 2.5 Buspar dizzy, Benadryl HS helps sleep, Willbutrin , mirtazapine 7.5, NAC cut out bc NR noted and doesn't like so many pills.  Trazodone,    Review of Systems:  Review of Systems  Gastrointestinal: Positive for diarrhea and nausea.  Musculoskeletal: Positive for arthralgias and back pain.       Sees chiropracter  Skin: Positive for rash.  Neurological: Negative for dizziness, tremors, weakness and headaches.       Balance isn't great. No recent falls.  Psychiatric/Behavioral: Positive for confusion and  decreased concentration. Negative for agitation, behavioral problems, dysphoric mood, hallucinations, self-injury, sleep disturbance and suicidal ideas. The patient is not nervous/anxious and is not hyperactive.     Medications: I have reviewed the patient's current medications.  Current Outpatient Medications  Medication Sig Dispense Refill  . Calcium Citrate (CITRACAL PO) Take by mouth 2 (two) times daily.     . Cholecalciferol 25 MCG (1000 UT) capsule Take 1 tablet by mouth every other day.    . fluticasone (FLONASE) 50 MCG/ACT nasal spray Place 2 sprays into both nostrils daily.    [name] ketoconazole (NIZORAL) 2 % shampoo WASH with FIVE ML ONCE TO twice APPLY WEEK    . levothyroxine (SYNTHROID) 88 MCG tablet Take 1 tablet (88 mcg total) by mouth daily before breakfast. 30 tablet 11  . lidocaine (XYLOCAINE) 2 % jelly USE AS NEEDED FOR PAINFUL HEMORROIDS THREE TIMES DAILY 30 mL 1  . loratadine (CLARITIN) 10 MG tablet Take 10 mg by mouth daily.    [name] omeprazole (PRILOSEC) 40 MG capsule Take 40 mg by mouth daily.  2  . PREVIDENT 5000 BOOSTER PLUS 1.1 % PSTE APPLY pea-sized AMOUNT TO toothbrush AND BRUSH teeth thoroughly FOR TWO MINUTES. USE DAILY in place of conventional paste    . psyllium (METAMUCIL) 58.6 % packet Take by mouth.    . QC PINK BISMUTH 262 MG chewable tablet PER pack instructions AS NEEDED diarrhea    . saccharomyces boulardii (FLORASTOR) 250 MG capsule Take 250 mg by mouth 2 (two) times daily.    [name] lithium carbonate 150 MG capsule Take one capsule by mouth every morning and two capsules at bedtime 270 capsule 0  . LORazepam (ATIVAN) 0.5 MG tablet Take 1 tablet (0.5 mg total) by mouth at bedtime. 90 tablet 0  . mirtazapine (REMERON) 15 MG tablet Take 0.5 tablets (7.5 mg total) by mouth at bedtime. 45 tablet 0   No current facility-administered medications for this visit.    Medication Side Effects: None  Allergies:  Allergies  Allergen Reactions  . Other Other (See Comments)     Allergic rhinitis symptoms     Past Medical History:  Diagnosis Date  . Anxiety   . Bipolar 1 disorder ([place])   . Breast mass, left   . Cancer (HCC)    hx of skin cancer  . Confusion   . Depression   . Eczema 2021  . Fibroid   . GERD (gastroesophageal reflux disease)   . History of colon polyps   . Hyperparathyroidism   . Hypothyroidism   . Leg lesion   . Osteopenia   . Restless leg   . Thyroid disease   . UTI (lower urinary tract infection)     Family History  Problem Relation Age of Onset  . Alzheimer's disease Mother   . Stroke Mother   . Hypertension Mother   . Diabetes Father   . Alzheimer's disease Father   . Cancer Father  colon  . Breast cancer Sister     Social History   Socioeconomic History  . Marital status: Widowed    Spouse name: Not on file  . Number of children: Not on file  . Years of education: Not on file  . Highest education level: Not on file  Occupational History  . Not on file  Tobacco Use  . Smoking status: Never Smoker  . Smokeless tobacco: Never Used  Vaping Use  . Vaping Use: Never used  Substance and Sexual Activity  . Alcohol use: Yes    Alcohol/week: 1.0 standard drink    Types: 1 Glasses of wine per week  . Drug use: No  . Sexual activity: Not Currently    Partners: Male    Birth control/protection: Post-menopausal  Other Topics Concern  . Not on file  Social History Narrative  . Not on file   Social Determinants of Health   Financial Resource Strain:   . Difficulty of Paying Living Expenses: Not on file  Food Insecurity:   . Worried About Charity fundraiser in the Last Year: Not on file  . Ran Out of Food in the Last Year: Not on file  Transportation Needs:   . Lack of Transportation (Medical): Not on file  . Lack of Transportation (Non-Medical): Not on file  Physical Activity:   . Days of Exercise per Week: Not on file  . Minutes of Exercise per Session: Not on file  Stress:   . Feeling of Stress  : Not on file  Social Connections:   . Frequency of Communication with Friends and Family: Not on file  . Frequency of Social Gatherings with Friends and Family: Not on file  . Attends Religious Services: Not on file  . Active Member of Clubs or Organizations: Not on file  . Attends Archivist Meetings: Not on file  . Marital Status: Not on file  Intimate Partner Violence:   . Fear of Current or Ex-Partner: Not on file  . Emotionally Abused: Not on file  . Physically Abused: Not on file  . Sexually Abused: Not on file    Past Medical History, Surgical history, Social history, and Family history were reviewed and updated as appropriate.   Please see review of systems for further details on the patient's review from today.   Objective:   Physical Exam:  LMP 01/10/2005 (Approximate)   Physical Exam Constitutional:      General: She is not in acute distress. Musculoskeletal:        General: No deformity.  Neurological:     Mental Status: She is alert and oriented to person, place, and time.     Motor: No tremor.     Coordination: Coordination normal.     Gait: Gait normal.  Psychiatric:        Attention and Perception: Attention and perception normal. She is attentive. She does not perceive auditory or visual hallucinations.        Mood and Affect: Mood is anxious. Mood is not depressed. Affect is not labile, blunt, angry or inappropriate.        Speech: Speech normal.        Behavior: Behavior normal.        Thought Content: Thought content normal. Thought content is not paranoid or delusional. Thought content does not include homicidal or suicidal ideation. Thought content does not include homicidal or suicidal plan.        Cognition and Memory: Memory normal.  Cognition is impaired.        Judgment: Judgment normal.     Comments: Neat. Insight and judgment fair. Mild forgetfulness and cognitive problems ongoing without much change.. .     Lab Review:      Component Value Date/Time   NA 142 07/16/2019 1453   K 3.9 07/16/2019 1453   CL 106 07/16/2019 1453   CO2 30 07/16/2019 1453   GLUCOSE 116 07/16/2019 1453   BUN 11 07/16/2019 1453   CREATININE 0.67 07/16/2019 1453   CALCIUM 10.9 (H) 07/16/2019 1453   GFRNONAA >90 09/11/2012 1330   GFRAA >90 09/11/2012 1330       Component Value Date/Time   WBC 8.3 09/11/2012 1330   RBC 4.45 09/11/2012 1330   HGB 13.7 09/11/2012 1330   HCT 42.4 09/11/2012 1330   PLT 186 09/11/2012 1330   MCV 95.3 09/11/2012 1330   MCH 30.8 09/11/2012 1330   MCHC 32.3 09/11/2012 1330   RDW 13.4 09/11/2012 1330   LYMPHSABS 1.1 09/11/2012 1330   MONOABS 0.6 09/11/2012 1330   EOSABS 0.1 09/11/2012 1330   BASOSABS 0.0 09/11/2012 1330    Lithium Lvl  Date Value Ref Range Status  07/16/2019 0.5 (L) 0.6 - 1.2 mmol/L Final     No results found for: PHENYTOIN, PHENOBARB, VALPROATE, CBMZ   Bone density test osteoporosis  Hips and spine.  Labs from January 6 included unremarkable CBC, comprehensive metabolic test unremarkable except for calcium 10.9 which is stable high.  Creatinine was 0.75 TSH was normal at 2.18, vitamin D was normal at 43.8, li thium level stable at 0.6 urinalysis unremarkable  .res Assessment: Plan:    Lithium use - Plan: Lithium level, Basic metabolic panel, lithium carbonate 150 MG capsule  Bipolar II disorder (HCC) - Plan: Lithium level, Basic metabolic panel, lithium carbonate 150 MG capsule  Generalized anxiety disorder - Plan: mirtazapine (REMERON) 15 MG tablet  Insomnia due to mental condition - Plan: LORazepam (ATIVAN) 0.5 MG tablet, mirtazapine (REMERON) 15 MG tablet   Borderline mild cognitive impairment but she is able to care for herself adequately. Episodically worse without known triggers.   She feels her mood and anxiety and sleep are pretty stable.  She has been on lithium for many years with good response.  Insomnia is managed with medications at [facility].  Greater than  50% of  30 min face to face time with patient was spent on counseling and coordination of care. Counseled patient regarding potential benefits, risks, and side effects of lithium to include potential risk of lithium affecting thyroid and renal functon.  Discussed need for periodic lab monitoring to determine drug level and to assess for potential adverse effects.  Counseled patient regarding signs and symptoms of lithium toxicity and advised that they notify office immediately or seek urgent medical attention if experiencing these signs and symptoms.  Patient advised to contact office with any questions or concerns.  She has a history of hyperparathyroidism and is aware that lithium can increase calcium levels.  At her age it would be very risky to attempt to find an alternative mood stabilizer when lithium has been effective.  Alternatives would expose her to different types of side effects such as those with the atypical antipsychotics including tardive dyskinesia.  We discussed the short-term risks associated with benzodiazepines including sedation and increased fall risk among others.  Discussed long-term side effect risk including dependence, potential withdrawal symptoms, and the potential eventual dose-related risk of dementia.  Also discussed  concerns about the Benadryl that she is taking at night for sleep which may adversely affect her memory as well.  Her cognition is declining a bit more but she is functional and OK with self care. Disc cognitive risks with OTC sleep aids.  Keep them at a minimum if possible.    No med changes today indicated Continue mirtazapine 7.5 mg hs Lithium 150 mg 1 in the morning and 2 at night Lorazepam successful reduction to 0.5 mg nightly  Specifically stopping lithium which has helped her for decades would be highly risky for significant mood destabilization which could last for months and expose her to risks of atypical antipsychotics which would be the  alternative to lithium.  She's already failed alternative of Depakote. She doesn't want to change the lithium.  She prefers labs checked at Dr. Darryl Nestle office vs [facility].  Sees him again  checked labs in July 2021.  Noted.  Lithium level remains low normal 0.5 from labs done early July.  TSH was a little low and calcium levels were a little higher.  It looks like the patient might of had a UTI and the labs were forwarded to her PCP and patient was encouraged to get in touch with PCP.  She had a recent UTI in May.   Repeat lithium level every 6 mos bc is on low dose with low level.   FU 3-4 mos  Lynder Parents, MD, DFAPA   Please see After Visit Summary for patient specific instructions.  No future appointments.  Orders Placed This Encounter  Procedures  . Lithium level  . Basic metabolic panel      -------------------------------

## 2019-12-08 DIAGNOSIS — R300 Dysuria: Secondary | ICD-10-CM | POA: Diagnosis not present

## 2020-01-28 ENCOUNTER — Telehealth: Payer: Self-pay

## 2020-01-28 NOTE — Telephone Encounter (Signed)
Prior Authorization submitted and approved for LORAZEPAM 0.5 MG #90/90 DAY with [employer] ID# [id number], effective 12/29/2019-01/27/2021, PA# [id number]

## 2020-02-05 DIAGNOSIS — R300 Dysuria: Secondary | ICD-10-CM | POA: Diagnosis not present

## 2020-02-11 ENCOUNTER — Ambulatory Visit: Payer: Self-pay | Admitting: Obstetrics and Gynecology

## 2020-02-13 DIAGNOSIS — I10 Essential (primary) hypertension: Secondary | ICD-10-CM | POA: Diagnosis not present

## 2020-02-13 DIAGNOSIS — E039 Hypothyroidism, unspecified: Secondary | ICD-10-CM | POA: Diagnosis not present

## 2020-02-13 DIAGNOSIS — E559 Vitamin D deficiency, unspecified: Secondary | ICD-10-CM | POA: Diagnosis not present

## 2020-02-19 DIAGNOSIS — Z0000 Encounter for general adult medical examination without abnormal findings: Secondary | ICD-10-CM | POA: Diagnosis not present

## 2020-02-19 DIAGNOSIS — F319 Bipolar disorder, unspecified: Secondary | ICD-10-CM | POA: Diagnosis not present

## 2020-02-19 DIAGNOSIS — R29898 Other symptoms and signs involving the musculoskeletal system: Secondary | ICD-10-CM | POA: Diagnosis not present

## 2020-02-19 DIAGNOSIS — E039 Hypothyroidism, unspecified: Secondary | ICD-10-CM | POA: Diagnosis not present

## 2020-02-19 DIAGNOSIS — K921 Melena: Secondary | ICD-10-CM | POA: Diagnosis not present

## 2020-02-19 DIAGNOSIS — R82998 Other abnormal findings in urine: Secondary | ICD-10-CM | POA: Diagnosis not present

## 2020-02-19 DIAGNOSIS — I10 Essential (primary) hypertension: Secondary | ICD-10-CM | POA: Diagnosis not present

## 2020-03-09 ENCOUNTER — Other Ambulatory Visit: Payer: Self-pay

## 2020-03-09 DIAGNOSIS — M810 Age-related osteoporosis without current pathological fracture: Secondary | ICD-10-CM | POA: Diagnosis not present

## 2020-03-09 DIAGNOSIS — E890 Postprocedural hypothyroidism: Secondary | ICD-10-CM | POA: Diagnosis not present

## 2020-03-09 DIAGNOSIS — E210 Primary hyperparathyroidism: Secondary | ICD-10-CM | POA: Diagnosis not present

## 2020-03-09 MED ORDER — LEVOTHYROXINE SODIUM 88 MCG PO TABS: 88.0000 ug | ORAL_TABLET | Freq: Every day | ORAL | 11 refills | Status: DC

## 2020-03-11 ENCOUNTER — Other Ambulatory Visit: Payer: Self-pay

## 2020-03-11 ENCOUNTER — Ambulatory Visit (INDEPENDENT_AMBULATORY_CARE_PROVIDER_SITE_OTHER): Payer: Medicare Other | Admitting: Psychiatry

## 2020-03-11 ENCOUNTER — Encounter: Payer: Self-pay | Admitting: Psychiatry

## 2020-03-11 DIAGNOSIS — F411 Generalized anxiety disorder: Secondary | ICD-10-CM

## 2020-03-11 DIAGNOSIS — F3181 Bipolar II disorder: Secondary | ICD-10-CM

## 2020-03-11 DIAGNOSIS — F5105 Insomnia due to other mental disorder: Secondary | ICD-10-CM

## 2020-03-11 DIAGNOSIS — Z79899 Other long term (current) drug therapy: Secondary | ICD-10-CM | POA: Diagnosis not present

## 2020-03-11 MED ORDER — MIRTAZAPINE 7.5 MG PO TABS: 7.5000 mg | ORAL_TABLET | Freq: Every day | ORAL | 1 refills | Status: DC

## 2020-03-11 MED ORDER — LORAZEPAM 0.5 MG PO TABS: 0.5000 mg | ORAL_TABLET | Freq: Every day | ORAL | 1 refills | Status: DC

## 2020-03-11 MED ORDER — LITHIUM CARBONATE 150 MG PO CAPS: ORAL_CAPSULE | ORAL | 1 refills | Status: DC

## 2020-03-11 NOTE — Progress Notes (Signed)
Amy [name] [id number] [birth year] 75 y.o.   Subjective:   [name] is a 75 y.o. (DOB [birth year]) female.  Chief Complaint:  Chief Complaint  Patient presents with  . Follow-up  . Bipolar II disorder ([place])  . Anxiety  . Sleeping Problem    Anxiety Symptoms include decreased concentration and nausea. Patient reports no confusion, dizziness, nervous/anxious behavior or suicidal ideas.     Amy [name] presents to the office today for follow-up of mood, anxiety and STM problems.    Last seen November 2020.  The following was noted: She moved to this appointment earlier. Amy [name] in on the appt Amy [name]. Not feeling well for over a week.  Staying with Amy [name] for a week.   Problems with diarrhea.  Using immodium. More tremor and confusion, poor memory, and poor handwriting.  Amy [name] notes sleeping a lot and is cold.   Has stayed well re: viral illness.  Has been able to still meet with 8 neighbors and that has continued to help her mental health.   Notices more trouble with word-finding and fluency and that bothers her.   Things better lately with Amy [name], this has been the chronic stressor for years.  Doesn't know why but pleased.  Disc other stressors with a recent friend when she tried to reconcile with him and he refused. No meds were changed.  05/10/2019 appointment, the following is noted: Disc lithium level variations from November 0.7 to April 0.4 with one in between of 1.7 at Amy [name] office without any changes in dosages.  No explanation for that change. Had been dealing with diarrhea but Amy [name] changed med added Florastor which helped.  Worries about the cat not sleeping with her.   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  Struggling to learn a new phone.  Can do Facebook. No med change.  08/05/19 appt with the following noted: Up and down.  Recent period of confusion.  Had a problem with one of neighbors. Asked for daughter's help and she came by  to help. No problems driving.  Word finding issues. Does her own shopping and pays bills. She went for follow up to labs July 6 and did not have UTI. Not anxious since being more social.  Not as easily upset. On same doses of lorazepam 0.5 mg HS, mirtazapine 7.5 mg HS, lithium 150 mg 1 cap AM and 2 HS. Some awakening hungry at night lately. Plan: no med changes  11/27/19 appt with following noted: Problems with GI vomiting diarrhea, skin problems with med changes.  Skin problem is better some with topicals.  N and diarrhea might be stress related bc is better now that keeps her distance from stressful neighbor.   They wondered about her stopping all meds to reevaluate. On same doses of lorazepam 0.5 mg HS, mirtazapine 7.5 mg HS, lithium 150 mg 1 cap AM and 2 HS. Sleep is good with meds.    Plan no med changes but check labs  03/11/2020 appointment with the following noted: Labs were checked and noted.  FU endocrinology tomorrow  No SE with meds. Still walking with other women. Covid and weather limit mood.  Scrapbooking helps.  GS visits and helps her with things. Needs colonoscopy.  Hip pain. Less confusion.  But can be a little forgetful.  Still can manage her own activities. Doesn't like to cook.  Likes her apt.  Patient reports stable mood and denies depressed or irritable moods.  Patient denies any  recent difficulty with anxiety.  Patient denies difficulty with sleep initiation or maintenance. Pleased with sleep.  Takes OTC sleep aid and needs it to sleep. Denies appetite disturbance.  Patient reports that energy and motivation have been good.  Patient worsening difficulty with concentration and memory.  Gets times confused.  No missed appts..  Patient denies any suicidal ideation.  Saw Amy [name] in January and the labs and he had no concerns about her labs.  Normal EKG  Past Psychiatric Medication Trials: Lamotrigine, Depakote, lithium, olanzapine 2.5 Buspar dizzy,  Benadryl HS  helps sleep,  Willbutrin , mirtazapine 7.5,  NAC cut out bc NR noted and doesn't like so many pills.  Trazodone,    Review of Systems:  Review of Systems  Gastrointestinal: Positive for diarrhea and nausea.  Musculoskeletal: Positive for arthralgias, back pain and gait problem.       Sees chiropracter  Skin: Positive for rash.  Neurological: Negative for dizziness, tremors, weakness and headaches.       Balance isn't great. No recent falls.  Psychiatric/Behavioral: Positive for decreased concentration. Negative for agitation, behavioral problems, confusion, dysphoric mood, hallucinations, self-injury, sleep disturbance and suicidal ideas. The patient is not nervous/anxious and is not hyperactive.     Medications: I have reviewed the patient's current medications.  Current Outpatient Medications  Medication Sig Dispense Refill  . Calcium Citrate (CITRACAL PO) Take by mouth 2 (two) times daily.     . Cholecalciferol 25 MCG (1000 UT) capsule Take 1 tablet by mouth every other day.    . levothyroxine (SYNTHROID) 88 MCG tablet Take 1 tablet (88 mcg total) by mouth daily before breakfast. 30 tablet 11  . omeprazole (PRILOSEC) 40 MG capsule Take 40 mg by mouth daily.  2  . psyllium (METAMUCIL) 58.6 % packet Take by mouth.    . fluticasone (FLONASE) 50 MCG/ACT nasal spray Place 2 sprays into both nostrils daily. (Patient not taking: Reported on 03/11/2020)    . ketoconazole (NIZORAL) 2 % shampoo WASH with FIVE ML ONCE TO twice APPLY WEEK (Patient not taking: Reported on 03/11/2020)    . lidocaine (XYLOCAINE) 2 % jelly USE AS NEEDED FOR PAINFUL HEMORROIDS THREE TIMES DAILY (Patient not taking: Reported on 03/11/2020) 30 mL 1  . lithium carbonate 150 MG capsule Take one capsule by mouth every morning and two capsules at bedtime 270 capsule 1  . loratadine (CLARITIN) 10 MG tablet Take 10 mg by mouth daily. (Patient not taking: Reported on 03/11/2020)    . LORazepam (ATIVAN) 0.5 MG tablet Take 1 tablet (0.5  mg total) by mouth at bedtime. 90 tablet 1  . mirtazapine (REMERON) 7.5 MG tablet Take 1 tablet (7.5 mg total) by mouth at bedtime. 90 tablet 1  . PREVIDENT 5000 BOOSTER PLUS 1.1 % PSTE APPLY pea-sized AMOUNT TO toothbrush AND BRUSH teeth thoroughly FOR TWO MINUTES. USE DAILY in place of conventional paste (Patient not taking: Reported on 03/11/2020)    . QC PINK BISMUTH 262 MG chewable tablet PER pack instructions AS NEEDED diarrhea (Patient not taking: Reported on 03/11/2020)    . saccharomyces boulardii (FLORASTOR) 250 MG capsule Take 250 mg by mouth 2 (two) times daily. (Patient not taking: Reported on 03/11/2020)     No current facility-administered medications for this visit.    Medication Side Effects: None  Allergies:  Allergies  Allergen Reactions  . Other Other (See Comments)    Allergic rhinitis symptoms     Past Medical History:  Diagnosis Date  .  Anxiety   . Bipolar 1 disorder ([place])   . Breast mass, left   . Cancer ([facility])    hx of skin cancer  . Confusion   . Depression   . Eczema 2021  . Fibroid   . GERD (gastroesophageal reflux disease)   . History of colon polyps   . Hyperparathyroidism   . Hypothyroidism   . Leg lesion   . Osteopenia   . Restless leg   . Thyroid disease   . UTI (lower urinary tract infection)     Family History  Problem Relation Age of Onset  . Alzheimer's disease Mother   . Stroke Mother   . Hypertension Mother   . Diabetes Father   . Alzheimer's disease Father   . Cancer Father        colon  . Breast cancer Sister     Social History   Socioeconomic History  . Marital status: Widowed    Spouse name: Not on file  . Number of children: Not on file  . Years of education: Not on file  . Highest education level: Not on file  Occupational History  . Not on file  Tobacco Use  . Smoking status: Never Smoker  . Smokeless tobacco: Never Used  Vaping Use  . Vaping Use: Never used  Substance and Sexual Activity  . Alcohol use: Yes     Alcohol/week: 1.0 standard drink    Types: 1 Glasses of wine per week  . Drug use: No  . Sexual activity: Not Currently    Partners: Male    Birth control/protection: Post-menopausal  Other Topics Concern  . Not on file  Social History Narrative  . Not on file   Social Determinants of Health   Financial Resource Strain: Not on file  Food Insecurity: Not on file  Transportation Needs: Not on file  Physical Activity: Not on file  Stress: Not on file  Social Connections: Not on file  Intimate Partner Violence: Not on file    Past Medical History, Surgical history, Social history, and Family history were reviewed and updated as appropriate.   Please see review of systems for further details on the patient's review from today.   Objective:   Physical Exam:  LMP 01/10/2005 (Approximate)   Physical Exam Constitutional:      General: She is not in acute distress. Musculoskeletal:        General: No deformity.  Neurological:     Mental Status: She is alert and oriented to person, place, and time.     Motor: No tremor.     Coordination: Coordination normal.     Gait: Gait normal.  Psychiatric:        Attention and Perception: Attention and perception normal. She is attentive. She does not perceive auditory or visual hallucinations.        Mood and Affect: Mood is anxious. Mood is not depressed. Affect is not labile, blunt, angry or inappropriate.        Speech: Speech normal. Speech is not rapid and pressured.        Behavior: Behavior normal.        Thought Content: Thought content normal. Thought content is not paranoid or delusional. Thought content does not include homicidal or suicidal ideation. Thought content does not include homicidal or suicidal plan.        Cognition and Memory: Memory normal. Cognition is impaired.        Judgment: Judgment normal.  Comments: Neat. Insight and judgment fair. Mild forgetfulness and cognitive problems ongoing without much  change.. .     Lab Review:     Component Value Date/Time   NA 142 07/16/2019 1453   K 3.9 07/16/2019 1453   CL 106 07/16/2019 1453   CO2 30 07/16/2019 1453   GLUCOSE 116 07/16/2019 1453   BUN 11 07/16/2019 1453   CREATININE 0.67 07/16/2019 1453   CALCIUM 10.9 (H) 07/16/2019 1453   GFRNONAA >90 09/11/2012 1330   GFRAA >90 09/11/2012 1330       Component Value Date/Time   WBC 8.3 09/11/2012 1330   RBC 4.45 09/11/2012 1330   HGB 13.7 09/11/2012 1330   HCT 42.4 09/11/2012 1330   PLT 186 09/11/2012 1330   MCV 95.3 09/11/2012 1330   MCH 30.8 09/11/2012 1330   MCHC 32.3 09/11/2012 1330   RDW 13.4 09/11/2012 1330   LYMPHSABS 1.1 09/11/2012 1330   MONOABS 0.6 09/11/2012 1330   EOSABS 0.1 09/11/2012 1330   BASOSABS 0.0 09/11/2012 1330    Lithium Lvl  Date Value Ref Range Status  07/16/2019 0.5 (L) 0.6 - 1.2 mmol/L Final    Component 03/09/20 06/12/19 11/17/16  Lithium Level 0.6 0.6 0.6    No results found for: PHENYTOIN, PHENOBARB, VALPROATE, CBMZ   Bone density test osteoporosis  Hips and spine.  Labs from January 6 included unremarkable CBC, comprehensive metabolic test unremarkable except for calcium 10.9 which is stable high.  Creatinine was 0.75 TSH was normal at 2.18, vitamin Amy [name] was normal at 43.8, li thium level stable at 0.6 urinalysis unremarkable  .res Assessment: Plan:    Bipolar II disorder ([place]) - Plan: lithium carbonate 150 MG capsule  Generalized anxiety disorder - Plan: mirtazapine (REMERON) 7.5 MG tablet  Insomnia due to mental condition - Plan: LORazepam (ATIVAN) 0.5 MG tablet, mirtazapine (REMERON) 7.5 MG tablet  Lithium use - Plan: lithium carbonate 150 MG capsule   Borderline mild cognitive impairment but she is able to care for herself adequately. Episodically worse without known triggers.   She feels her mood and anxiety and sleep are pretty stable.  She has been on lithium for many years with good response.  Insomnia is managed with  medications at [facility].  Greater than 50% of  30 min face to face time with patient was spent on counseling and coordination of care. Counseled patient regarding potential benefits, risks, and side effects of lithium to include potential risk of lithium affecting thyroid and renal functon.  Discussed need for periodic lab monitoring to determine drug level and to assess for potential adverse effects.  Counseled patient regarding signs and symptoms of lithium toxicity and advised that they notify office immediately or seek urgent medical attention if experiencing these signs and symptoms.  Patient advised to contact office with any questions or concerns.  She has a history of hyperparathyroidism and is aware that lithium can increase calcium levels.  At her age it would be very risky to attempt to find an alternative mood stabilizer when lithium has been effective.  Alternatives would expose her to different types of side effects such as those with the atypical antipsychotics including tardive dyskinesia.  We discussed the short-term risks associated with benzodiazepines including sedation and increased fall risk among others.  Discussed long-term side effect risk including dependence, potential withdrawal symptoms, and the potential eventual dose-related risk of dementia.  Also discussed concerns about the Benadryl that she is taking at night for sleep which may adversely  affect her memory as well.  Her cognition is declining a bit more but she is functional and OK with self care. Disc cognitive risks with OTC sleep aids.  Keep them at a minimum if possible.    No med changes today indicated Continue mirtazapine 7.5 mg hs Lithium 150 mg 1 in the morning and 2 at night Lorazepam successful reduction to 0.5 mg nightly  Specifically stopping lithium which has helped her for decades would be highly risky for significant mood destabilization which could last for months and expose her to risks of atypical  antipsychotics which would be the alternative to lithium.  She's already failed alternative of Depakote. She doesn't want to change the lithium.  She prefers labs checked at Dr. [name]'s office vs [facility].   03/09/2020 lithium 0.6 stable, creatinine 0.69, calcium 10.4, ionized calcium slightly elevated 1.34, TSH 2.55 Repeat lithium level every 6 mos bc is on low dose with low level.   FU 3-4 mos  Lynder Parents, MD, DFAPA   Please see After Visit Summary for patient specific instructions.  No future appointments.  No orders of the defined types were placed in this encounter.     -------------------------------

## 2020-03-12 DIAGNOSIS — E210 Primary hyperparathyroidism: Secondary | ICD-10-CM | POA: Diagnosis not present

## 2020-03-12 DIAGNOSIS — E890 Postprocedural hypothyroidism: Secondary | ICD-10-CM | POA: Diagnosis not present

## 2020-03-12 DIAGNOSIS — F319 Bipolar disorder, unspecified: Secondary | ICD-10-CM | POA: Diagnosis not present

## 2020-03-12 DIAGNOSIS — M810 Age-related osteoporosis without current pathological fracture: Secondary | ICD-10-CM | POA: Diagnosis not present

## 2020-05-02 DIAGNOSIS — R300 Dysuria: Secondary | ICD-10-CM | POA: Diagnosis not present

## 2020-05-02 DIAGNOSIS — A499 Bacterial infection, unspecified: Secondary | ICD-10-CM | POA: Diagnosis not present

## 2020-05-02 DIAGNOSIS — R82998 Other abnormal findings in urine: Secondary | ICD-10-CM | POA: Diagnosis not present

## 2020-05-02 DIAGNOSIS — N390 Urinary tract infection, site not specified: Secondary | ICD-10-CM | POA: Diagnosis not present

## 2020-05-28 ENCOUNTER — Telehealth: Payer: Self-pay

## 2020-05-28 NOTE — Telephone Encounter (Signed)
Patient said she received a letter from endocrinologist, Dr. Letitia Neri. He is retiring 07/09/20. She has an appointment with him on 07/03/20 before he retires.  She said you referred her to him many years ago and she said he has been a Network engineer.    She asked if she should just stay with his practice [facility]) as his letter recommends or would you know if someone else wonderful to refer her to.

## 2020-05-29 NOTE — Telephone Encounter (Signed)
Patient was very confused by the below note, she reports Dr.Altimer is not with [facility].   I told her she should see whatever provider Dr.Alitimer recommends, patient was agreeable with plan.

## 2020-05-29 NOTE — Telephone Encounter (Signed)
[facility] is a good option for her, as she is already established there.  Dr. Forde Dandy does a lot of endocrinology care if his opinion is needed.

## 2020-06-01 DIAGNOSIS — Z800 Family history of malignant neoplasm of digestive organs: Secondary | ICD-10-CM | POA: Diagnosis not present

## 2020-06-01 DIAGNOSIS — K649 Unspecified hemorrhoids: Secondary | ICD-10-CM | POA: Diagnosis not present

## 2020-06-01 DIAGNOSIS — Z86010 Personal history of colonic polyps: Secondary | ICD-10-CM | POA: Diagnosis not present

## 2020-06-10 ENCOUNTER — Other Ambulatory Visit: Payer: Self-pay

## 2020-06-10 ENCOUNTER — Ambulatory Visit (INDEPENDENT_AMBULATORY_CARE_PROVIDER_SITE_OTHER): Payer: Medicare Other | Admitting: Psychiatry

## 2020-06-10 ENCOUNTER — Encounter: Payer: Self-pay | Admitting: Psychiatry

## 2020-06-10 DIAGNOSIS — F3181 Bipolar II disorder: Secondary | ICD-10-CM

## 2020-06-10 DIAGNOSIS — F5105 Insomnia due to other mental disorder: Secondary | ICD-10-CM

## 2020-06-10 DIAGNOSIS — Z79899 Other long term (current) drug therapy: Secondary | ICD-10-CM

## 2020-06-10 DIAGNOSIS — F411 Generalized anxiety disorder: Secondary | ICD-10-CM | POA: Diagnosis not present

## 2020-06-10 NOTE — Patient Instructions (Signed)
Try to mirtazapine at night instead of the over-the-counter sleep medicine to see if that improves her sleep.  Try it 3 or 4 nights and if it is effective let the doctor know

## 2020-06-10 NOTE — Progress Notes (Signed)
Amy [name] [id number] [birth year] 75 y.o.   Subjective:   [name] is a 75 y.o. (DOB 04/14/1945) female.  Chief Complaint:  Chief Complaint  Patient presents with  . Follow-up  . Bipolar II disorder ([place])  . Anxiety    Anxiety Symptoms include decreased concentration and nausea. Patient reports no confusion, dizziness, nervous/anxious behavior or suicidal ideas.     Tonye Pearson Renaud presents to the office today for follow-up of mood, anxiety and STM problems.    Last seen November 2020.  The following was noted: She moved to this appointment earlier. D in on the appt [name]. Not feeling well for over a week.  Staying with [name] for a week.   Problems with diarrhea.  Using immodium. More tremor and confusion, poor memory, and poor handwriting.  D notes sleeping a lot and is cold.   Has stayed well re: viral illness.  Has been able to still meet with 8 neighbors and that has continued to help her mental health.   Notices more trouble with word-finding and fluency and that bothers her.   Things better lately with D, this has been the chronic stressor for years.  Doesn't know why but pleased.  Disc other stressors with a recent friend when she tried to reconcile with him and he refused. No meds were changed.  05/10/2019 appointment, the following is noted: Disc lithium level variations from November 0.7 to April 0.4 with one in between of 1.7 at [name] office without any changes in dosages.  No explanation for that change. Had been dealing with diarrhea but [name] changed med added Florastor which helped.  Worries about the cat not sleeping with her.   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  Struggling to learn a new phone.  Can do Facebook. No med change.  08/05/19 appt with the following noted: Up and down.  Recent period of confusion.  Had a problem with one of neighbors. Asked for daughter's help and she came by to help. No problems  driving.  Word finding issues. Does her own shopping and pays bills. She went for follow up to labs July 6 and did not have UTI. Not anxious since being more social.  Not as easily upset. On same doses of lorazepam 0.5 mg HS, mirtazapine 7.5 mg HS, lithium 150 mg 1 cap AM and 2 HS. Some awakening hungry at night lately. Plan: no med changes  11/27/19 appt with following noted: Problems with GI vomiting diarrhea, skin problems with med changes.  Skin problem is better some with topicals.  N and diarrhea might be stress related bc is better now that keeps her distance from stressful neighbor.   They wondered about her stopping all meds to reevaluate. On same doses of lorazepam 0.5 mg HS, mirtazapine 7.5 mg HS, lithium 150 mg 1 cap AM and 2 HS. Sleep is good with meds.    Plan no med changes but check labs  03/11/2020 appointment with the following noted: Labs were checked and noted.  FU endocrinology tomorrow  No SE with meds. Still walking with other women. Covid and weather limit mood.  Scrapbooking helps.  GS visits and helps her with things. Needs colonoscopy.  Hip pain. Less confusion.  But can be a little forgetful.  Still can manage her own activities. Doesn't like to cook.  Likes her apt.  Plan: no med changes  06/10/20 appt with following noted: Neighbor group has split up.  New resident  came in and split things up.  Colonoscopy pending for August bc concerns  Raised at [facility]. Sleep not as good.  Problems with neighbors bothers her sleep.  Delayed sleep phase  Went to Movie No SE with meds  Patient reports stable mood and denies depressed or irritable moods.  Patient has recent difficulty with anxiety over situation.  Patient denies difficulty with sleep initiation or maintenance. Pleased with sleep.  Takes OTC sleep aid and needs it to sleep.  No hangover. Denies appetite disturbance.  Patient reports that energy and motivation have been good.  Patient worsening difficulty with  concentration and memory.  Gets times confused.  No missed appts..  Patient denies any suicidal ideation.  Saw [name] in January and the labs and he had no concerns about her labs.  Normal EKG  Past Psychiatric Medication Trials: Lamotrigine, Depakote, lithium, olanzapine 2.5 Buspar dizzy,  Benadryl HS helps sleep,  Willbutrin , mirtazapine 7.5,  NAC cut out bc NR noted and doesn't like so many pills.  Trazodone,    Review of Systems:  Review of Systems  HENT: Positive for voice change.   Gastrointestinal: Positive for diarrhea and nausea.  Musculoskeletal: Positive for arthralgias, back pain and gait problem.       Sees chiropracter  Skin: Positive for rash.  Neurological: Negative for dizziness, tremors, weakness and headaches.       Balance isn't great. No recent falls.  Psychiatric/Behavioral: Positive for decreased concentration. Negative for agitation, behavioral problems, confusion, dysphoric mood, hallucinations, self-injury, sleep disturbance and suicidal ideas. The patient is not nervous/anxious and is not hyperactive.     Medications: I have reviewed the patient's current medications.  Current Outpatient Medications  Medication Sig Dispense Refill  . Calcium Citrate (CITRACAL PO) Take by mouth 2 (two) times daily.     . Cholecalciferol 25 MCG (1000 UT) capsule Take 1 tablet by mouth every other day.    . levothyroxine (SYNTHROID) 88 MCG tablet Take 1 tablet (88 mcg total) by mouth daily before breakfast. 30 tablet 11  . lithium carbonate 150 MG capsule Take one capsule by mouth every morning and two capsules at bedtime 270 capsule 1  . loratadine (CLARITIN) 10 MG tablet Take 10 mg by mouth daily.    [name] LORazepam (ATIVAN) 0.5 MG tablet Take 1 tablet (0.5 mg total) by mouth at bedtime. 90 tablet 1  . mirtazapine (REMERON) 7.5 MG tablet Take 1 tablet (7.5 mg total) by mouth at bedtime. 90 tablet 1  . omeprazole (PRILOSEC) 40 MG capsule Take 40 mg by mouth daily.  2  .  psyllium (METAMUCIL) 58.6 % packet Take by mouth.    . fluticasone (FLONASE) 50 MCG/ACT nasal spray Place 2 sprays into both nostrils daily. (Patient not taking: No sig reported)    . ketoconazole (NIZORAL) 2 % shampoo WASH with FIVE ML ONCE TO twice APPLY WEEK (Patient not taking: No sig reported)    . lidocaine (XYLOCAINE) 2 % jelly USE AS NEEDED FOR PAINFUL HEMORROIDS THREE TIMES DAILY (Patient not taking: No sig reported) 30 mL 1  . PREVIDENT 5000 BOOSTER PLUS 1.1 % PSTE APPLY pea-sized AMOUNT TO toothbrush AND BRUSH teeth thoroughly FOR TWO MINUTES. USE DAILY in place of conventional paste (Patient not taking: No sig reported)    . QC PINK BISMUTH 262 MG chewable tablet PER pack instructions AS NEEDED diarrhea (Patient not taking: No sig reported)    . saccharomyces boulardii (FLORASTOR) 250 MG capsule Take 250 mg by  mouth 2 (two) times daily. (Patient not taking: No sig reported)     No current facility-administered medications for this visit.    Medication Side Effects: None  Allergies:  Allergies  Allergen Reactions  . Other Other (See Comments)    Allergic rhinitis symptoms     Past Medical History:  Diagnosis Date  . Anxiety   . Bipolar 1 disorder ([place])   . Breast mass, left   . Cancer ([facility])    hx of skin cancer  . Confusion   . Depression   . Eczema 2021  . Fibroid   . GERD (gastroesophageal reflux disease)   . History of colon polyps   . Hyperparathyroidism   . Hypothyroidism   . Leg lesion   . Osteopenia   . Restless leg   . Thyroid disease   . UTI (lower urinary tract infection)     Family History  Problem Relation Age of Onset  . Alzheimer's disease Mother   . Stroke Mother   . Hypertension Mother   . Diabetes Father   . Alzheimer's disease Father   . Cancer Father        colon  . Breast cancer Sister     Social History   Socioeconomic History  . Marital status: Widowed    Spouse name: Not on file  . Number of children: Not on file  . Years  of education: Not on file  . Highest education level: Not on file  Occupational History  . Not on file  Tobacco Use  . Smoking status: Never Smoker  . Smokeless tobacco: Never Used  Vaping Use  . Vaping Use: Never used  Substance and Sexual Activity  . Alcohol use: Yes    Alcohol/week: 1.0 standard drink    Types: 1 Glasses of wine per week  . Drug use: No  . Sexual activity: Not Currently    Partners: Male    Birth control/protection: Post-menopausal  Other Topics Concern  . Not on file  Social History Narrative  . Not on file   Social Determinants of Health   Financial Resource Strain: Not on file  Food Insecurity: Not on file  Transportation Needs: Not on file  Physical Activity: Not on file  Stress: Not on file  Social Connections: Not on file  Intimate Partner Violence: Not on file    Past Medical History, Surgical history, Social history, and Family history were reviewed and updated as appropriate.   Please see review of systems for further details on the patient's review from today.   Objective:   Physical Exam:  LMP 01/10/2005 (Approximate)   Physical Exam Constitutional:      General: She is not in acute distress. Musculoskeletal:        General: No deformity.  Neurological:     Mental Status: She is alert and oriented to person, place, and time.     Motor: No tremor.     Coordination: Coordination normal.     Gait: Gait normal.  Psychiatric:        Attention and Perception: Attention and perception normal. She is attentive. She does not perceive auditory or visual hallucinations.        Mood and Affect: Mood is anxious. Mood is not depressed. Affect is not labile, blunt, angry or inappropriate.        Speech: Speech normal. Speech is not rapid and pressured.        Behavior: Behavior normal. Behavior is not slowed.  Thought Content: Thought content normal. Thought content is not paranoid or delusional. Thought content does not include homicidal  or suicidal ideation. Thought content does not include homicidal or suicidal plan.        Cognition and Memory: Memory normal. Cognition is impaired.        Judgment: Judgment normal.     Comments: Neat. Insight and judgment fair. Mild forgetfulness and cognitive problems ongoing without much change.. .     Lab Review:     Component Value Date/Time   NA 142 07/16/2019 1453   K 3.9 07/16/2019 1453   CL 106 07/16/2019 1453   CO2 30 07/16/2019 1453   GLUCOSE 116 07/16/2019 1453   BUN 11 07/16/2019 1453   CREATININE 0.67 07/16/2019 1453   CALCIUM 10.9 (H) 07/16/2019 1453   GFRNONAA >90 09/11/2012 1330   GFRAA >90 09/11/2012 1330       Component Value Date/Time   WBC 8.3 09/11/2012 1330   RBC 4.45 09/11/2012 1330   HGB 13.7 09/11/2012 1330   HCT 42.4 09/11/2012 1330   PLT 186 09/11/2012 1330   MCV 95.3 09/11/2012 1330   MCH 30.8 09/11/2012 1330   MCHC 32.3 09/11/2012 1330   RDW 13.4 09/11/2012 1330   LYMPHSABS 1.1 09/11/2012 1330   MONOABS 0.6 09/11/2012 1330   EOSABS 0.1 09/11/2012 1330   BASOSABS 0.0 09/11/2012 1330    Lithium Lvl  Date Value Ref Range Status  07/16/2019 0.5 (L) 0.6 - 1.2 mmol/L Final    Component 03/09/20 06/12/19 11/17/16  Lithium Level 0.6 0.6 0.6    No results found for: PHENYTOIN, PHENOBARB, VALPROATE, CBMZ   Bone density test osteoporosis  Hips and spine.  Labs from January 6 included unremarkable CBC, comprehensive metabolic test unremarkable except for calcium 10.9 which is stable high.  Creatinine was 0.75 TSH was normal at 2.18, vitamin D was normal at 43.8, li thium level stable at 0.6 urinalysis unremarkable  .res Assessment: Plan:    Bipolar II disorder ([place])  Generalized anxiety disorder  Insomnia due to mental condition  Lithium use   Borderline mild cognitive impairment but she is able to care for herself adequately. Episodically worse without known triggers.   She feels her mood and anxiety and sleep are pretty stable  except as noted.  She has been on lithium for many years with good response.  Insomnia is managed with medications at [facility].  Greater than 50% of  30 min face to face time with patient was spent on counseling and coordination of care. Counseled patient regarding potential benefits, risks, and side effects of lithium to include potential risk of lithium affecting thyroid and renal functon.  Discussed need for periodic lab monitoring to determine drug level and to assess for potential adverse effects.  Counseled patient regarding signs and symptoms of lithium toxicity and advised that they notify office immediately or seek urgent medical attention if experiencing these signs and symptoms.  Patient advised to contact office with any questions or concerns.  She has a history of hyperparathyroidism and is aware that lithium can increase calcium levels.  At her age it would be very risky to attempt to find an alternative mood stabilizer when lithium has been effective.  Alternatives would expose her to different types of side effects such as those with the atypical antipsychotics including tardive dyskinesia.  We discussed the short-term risks associated with benzodiazepines including sedation and increased fall risk among others.  Discussed long-term side effect risk including dependence,  potential withdrawal symptoms, and the potential eventual dose-related risk of dementia.  Also discussed concerns about the Benadryl that she is taking at night for sleep which may adversely affect her memory as well.  Her cognition is declining a bit more but she is functional and OK with self care. Disc cognitive risks with OTC sleep aids.  Keep them at a minimum if possible.     Disc Covid testing and her questions about it.  No current sx.  Disc alternative sleeper.  First try increase mirtazapine to 15 mg HS to see if sleep better.  Lithium 150 mg 1 in the morning and 2 at night Lorazepam successful reduction to 0.5 mg  nightly  Specifically stopping lithium which has helped her for decades would be highly risky for significant mood destabilization which could last for months and expose her to risks of atypical antipsychotics which would be the alternative to lithium.  She's already failed alternative of Depakote. She doesn't want to change the lithium.  She prefers labs checked at Dr. [name]'s office vs [facility].   03/09/2020 lithium 0.6 stable, creatinine 0.69, calcium 10.4, ionized calcium slightly elevated 1.34, TSH 2.55 Repeat lithium level every 6 mos bc is on low dose with low level.   FU 3-4 mos  Lynder Parents, MD, DFAPA   Please see After Visit Summary for patient specific instructions.  Future Appointments  Date Time Provider [place]  09/09/2020 11:30 AM Yisroel Ramming, Everardo All, MD [facility] None    No orders of the defined types were placed in this encounter.     -------------------------------

## 2020-06-17 ENCOUNTER — Telehealth: Payer: Self-pay | Admitting: Psychiatry

## 2020-06-17 NOTE — Telephone Encounter (Signed)
Pt called to report on Mirtazepine. Has taken for 5 nights. Does not fall asleep any faster but she does sleep later. Stated she stayed up watching too many movies. Did not take until 11-12 pm. Question should she increase to 3 and should she take earlier. Contact @ [phone]

## 2020-06-17 NOTE — Telephone Encounter (Signed)
Please review

## 2020-06-18 ENCOUNTER — Telehealth: Payer: Self-pay | Admitting: Psychiatry

## 2020-06-18 NOTE — Telephone Encounter (Signed)
Pt called 6/8 about sleeping med Mirtazepine. Questioning should she increase to 3 tablets. Currently taking 2. Not falling asleep any faster, but sleeping much later. Also, taking late at night 11pm-12pm. If she takes earlier may not sleep as late. Trying to send 2 Rx to pharmacy at same time, due to they deliver. Contact # [phone].

## 2020-06-18 NOTE — Telephone Encounter (Signed)
I gave her info but pt could not give me a estimate of how long she sleeps because she goes to bed at different times but last night she slept from 11pm-8am.She stated she will take 2 hours before bedtime.

## 2020-06-18 NOTE — Telephone Encounter (Signed)
Reviewed

## 2020-06-18 NOTE — Telephone Encounter (Signed)
Please ask the total number of hours she is currently sleeping.  The primary determinate of when your body will want to go to sleep is the time you get out of bed.  I understand she wants to go to sleep earlier.  For that to happen she will need to make sure she gets up 16 hours before she wants to go to sleep.  So for example if she wants to go to sleep at 10 PM she will need to get up at 6 AM.  If she wants to go to sleep at midnight then she will need to get up by 8 AM.  I assume by her message that she is sleeping longer since she increased mirtazapine to 2 of the 7.5 mg tablets.  Increasing to 3 of the tablets is not likely to make her go to sleep faster.  However she can take the mirtazapine earlier.  I would not take it any earlier than 2 hours before bedtime however.

## 2020-06-18 NOTE — Telephone Encounter (Signed)
Please review

## 2020-06-19 ENCOUNTER — Other Ambulatory Visit: Payer: Self-pay

## 2020-06-19 DIAGNOSIS — F411 Generalized anxiety disorder: Secondary | ICD-10-CM

## 2020-06-19 DIAGNOSIS — F5105 Insomnia due to other mental disorder: Secondary | ICD-10-CM

## 2020-06-19 MED ORDER — MIRTAZAPINE 7.5 MG PO TABS: 7.5000 mg | ORAL_TABLET | Freq: Every day | ORAL | 0 refills | Status: DC

## 2020-06-19 NOTE — Telephone Encounter (Signed)
Yes he responded to the other message I spoke to her

## 2020-06-19 NOTE — Telephone Encounter (Signed)
Amy [name] did you get with CC on this Pt?

## 2020-06-25 DIAGNOSIS — Z803 Family history of malignant neoplasm of breast: Secondary | ICD-10-CM | POA: Diagnosis not present

## 2020-06-25 DIAGNOSIS — Z1231 Encounter for screening mammogram for malignant neoplasm of breast: Secondary | ICD-10-CM | POA: Diagnosis not present

## 2020-06-30 DIAGNOSIS — E890 Postprocedural hypothyroidism: Secondary | ICD-10-CM | POA: Diagnosis not present

## 2020-06-30 DIAGNOSIS — M810 Age-related osteoporosis without current pathological fracture: Secondary | ICD-10-CM | POA: Diagnosis not present

## 2020-06-30 DIAGNOSIS — E210 Primary hyperparathyroidism: Secondary | ICD-10-CM | POA: Diagnosis not present

## 2020-06-30 DIAGNOSIS — F319 Bipolar disorder, unspecified: Secondary | ICD-10-CM | POA: Diagnosis not present

## 2020-07-03 DIAGNOSIS — E890 Postprocedural hypothyroidism: Secondary | ICD-10-CM | POA: Diagnosis not present

## 2020-07-03 DIAGNOSIS — E210 Primary hyperparathyroidism: Secondary | ICD-10-CM | POA: Diagnosis not present

## 2020-07-03 DIAGNOSIS — F319 Bipolar disorder, unspecified: Secondary | ICD-10-CM | POA: Diagnosis not present

## 2020-07-03 DIAGNOSIS — M810 Age-related osteoporosis without current pathological fracture: Secondary | ICD-10-CM | POA: Diagnosis not present

## 2020-07-28 ENCOUNTER — Telehealth: Payer: Self-pay | Admitting: Psychiatry

## 2020-07-28 DIAGNOSIS — D120 Benign neoplasm of cecum: Secondary | ICD-10-CM | POA: Diagnosis not present

## 2020-07-28 DIAGNOSIS — Z86010 Personal history of colonic polyps: Secondary | ICD-10-CM | POA: Diagnosis not present

## 2020-07-28 DIAGNOSIS — K5730 Diverticulosis of large intestine without perforation or abscess without bleeding: Secondary | ICD-10-CM | POA: Diagnosis not present

## 2020-07-28 DIAGNOSIS — K6289 Other specified diseases of anus and rectum: Secondary | ICD-10-CM | POA: Diagnosis not present

## 2020-07-28 DIAGNOSIS — K644 Residual hemorrhoidal skin tags: Secondary | ICD-10-CM | POA: Diagnosis not present

## 2020-07-28 DIAGNOSIS — K648 Other hemorrhoids: Secondary | ICD-10-CM | POA: Diagnosis not present

## 2020-07-28 DIAGNOSIS — D122 Benign neoplasm of ascending colon: Secondary | ICD-10-CM | POA: Diagnosis not present

## 2020-07-28 DIAGNOSIS — D123 Benign neoplasm of transverse colon: Secondary | ICD-10-CM | POA: Diagnosis not present

## 2020-07-28 DIAGNOSIS — Z800 Family history of malignant neoplasm of digestive organs: Secondary | ICD-10-CM | POA: Diagnosis not present

## 2020-07-28 NOTE — Telephone Encounter (Signed)
Please clarify exactly what she should be taking because the phone messages are not clear.The pharmacy closes down on Thursday so she will have to find a new one anyway

## 2020-07-28 NOTE — Telephone Encounter (Signed)
Ok. Change RX mirtazapine to 1 nightly

## 2020-07-28 NOTE — Telephone Encounter (Signed)
Next visit is 09/15/20. Marleah called and said that the last script that was written for Mirtazapine was wrong. For the past two times its said  take two tablets by mouth at bedtime. She says the script needs to say take 1 tablet by mouth at bedtime. Her pharmacy is [facility] and phone number is [phone]. [facility] is closing their doors tomorrow. [facility] phone number is [phone].

## 2020-07-30 ENCOUNTER — Other Ambulatory Visit: Payer: Self-pay | Admitting: Psychiatry

## 2020-07-30 DIAGNOSIS — F411 Generalized anxiety disorder: Secondary | ICD-10-CM

## 2020-07-30 DIAGNOSIS — F5105 Insomnia due to other mental disorder: Secondary | ICD-10-CM

## 2020-07-30 MED ORDER — MIRTAZAPINE 7.5 MG PO TABS: 7.5000 mg | ORAL_TABLET | Freq: Every day | ORAL | 0 refills | Status: DC

## 2020-07-30 NOTE — Telephone Encounter (Signed)
RX sent mirtazapine 7.5 mg 1 hs

## 2020-07-30 NOTE — Telephone Encounter (Signed)
How many mg?

## 2020-07-31 DIAGNOSIS — D122 Benign neoplasm of ascending colon: Secondary | ICD-10-CM | POA: Diagnosis not present

## 2020-07-31 DIAGNOSIS — D123 Benign neoplasm of transverse colon: Secondary | ICD-10-CM | POA: Diagnosis not present

## 2020-07-31 DIAGNOSIS — D120 Benign neoplasm of cecum: Secondary | ICD-10-CM | POA: Diagnosis not present

## 2020-08-13 ENCOUNTER — Telehealth: Payer: Self-pay | Admitting: Psychiatry

## 2020-08-13 ENCOUNTER — Telehealth: Payer: Self-pay

## 2020-08-13 DIAGNOSIS — F411 Generalized anxiety disorder: Secondary | ICD-10-CM

## 2020-08-13 DIAGNOSIS — F5105 Insomnia due to other mental disorder: Secondary | ICD-10-CM

## 2020-08-13 MED ORDER — MIRTAZAPINE 7.5 MG PO TABS: 7.5000 mg | ORAL_TABLET | Freq: Every day | ORAL | 0 refills | Status: DC

## 2020-08-13 MED ORDER — LORAZEPAM 0.5 MG PO TABS: 0.5000 mg | ORAL_TABLET | Freq: Every day | ORAL | 0 refills | Status: DC

## 2020-08-13 NOTE — Telephone Encounter (Signed)
[facility], [place], [place], [place]. Phone number is [phone] called to request a refill on Mirtazapine and Lorazepam. Amy [name]'s previous pharmacy was [facility].

## 2020-08-13 NOTE — Telephone Encounter (Signed)
Addendum to previous message....Arkesha doesn't have anymore Mirtazapine left. She has been out of it for a week.

## 2020-08-13 NOTE — Telephone Encounter (Signed)
Pended.

## 2020-08-13 NOTE — Telephone Encounter (Signed)
Rx was sent  

## 2020-08-19 DIAGNOSIS — G250 Essential tremor: Secondary | ICD-10-CM | POA: Diagnosis not present

## 2020-08-19 DIAGNOSIS — I10 Essential (primary) hypertension: Secondary | ICD-10-CM | POA: Diagnosis not present

## 2020-08-19 DIAGNOSIS — F319 Bipolar disorder, unspecified: Secondary | ICD-10-CM | POA: Diagnosis not present

## 2020-08-19 DIAGNOSIS — J309 Allergic rhinitis, unspecified: Secondary | ICD-10-CM | POA: Diagnosis not present

## 2020-08-19 DIAGNOSIS — K219 Gastro-esophageal reflux disease without esophagitis: Secondary | ICD-10-CM | POA: Diagnosis not present

## 2020-08-19 DIAGNOSIS — E039 Hypothyroidism, unspecified: Secondary | ICD-10-CM | POA: Diagnosis not present

## 2020-08-19 DIAGNOSIS — J45909 Unspecified asthma, uncomplicated: Secondary | ICD-10-CM | POA: Diagnosis not present

## 2020-08-19 DIAGNOSIS — E559 Vitamin D deficiency, unspecified: Secondary | ICD-10-CM | POA: Diagnosis not present

## 2020-09-09 ENCOUNTER — Encounter: Payer: Self-pay | Admitting: Obstetrics and Gynecology

## 2020-09-09 ENCOUNTER — Other Ambulatory Visit (HOSPITAL_COMMUNITY)
Admission: RE | Admit: 2020-09-09 | Discharge: 2020-09-09 | Disposition: A | Discharge status: Home / Self Care | Payer: Medicare Other | Source: Ambulatory Visit | Attending: Obstetrics and Gynecology | Admitting: Obstetrics and Gynecology

## 2020-09-09 ENCOUNTER — Ambulatory Visit (INDEPENDENT_AMBULATORY_CARE_PROVIDER_SITE_OTHER): Payer: Medicare Other | Admitting: Obstetrics and Gynecology

## 2020-09-09 ENCOUNTER — Other Ambulatory Visit: Payer: Self-pay

## 2020-09-09 VITALS — BP 164/70 | HR 76 | Resp 16 | Ht 65.0 in | Wt 125.0 lb

## 2020-09-09 DIAGNOSIS — N952 Postmenopausal atrophic vaginitis: Secondary | ICD-10-CM

## 2020-09-09 DIAGNOSIS — Z01419 Encounter for gynecological examination (general) (routine) without abnormal findings: Secondary | ICD-10-CM | POA: Diagnosis not present

## 2020-09-09 DIAGNOSIS — Z124 Encounter for screening for malignant neoplasm of cervix: Secondary | ICD-10-CM | POA: Insufficient documentation

## 2020-09-09 DIAGNOSIS — R32 Unspecified urinary incontinence: Secondary | ICD-10-CM | POA: Diagnosis not present

## 2020-09-09 MED ORDER — ESTRADIOL 0.1 MG/GM VA CREA: TOPICAL_CREAM | VAGINAL | 1 refills | Status: DC

## 2020-09-09 NOTE — Patient Instructions (Signed)

## 2020-09-09 NOTE — Progress Notes (Signed)
Lab4 GYNECOLOGY  VISIT   HPI: 75 y.o.   Widowed  Caucasian  female   G26P2 with Patient's last menstrual period was 01/10/2005 (approximate).   here for Breast and pelvic exam.   Patient wants a refill on Estrace cream for vaginal atrophy.    She is having leakage of urine.  She is wearing a pad all the time.  Does not leak with laugh or sneeze.  Leaks without warning.   Drinks Dr. Malachi Bonds and ice tea. Buys the tea in a big jug.  Likes orange juice as well.   She did pelvic floor therapy in the past and did not like it.   She denies vaginal discharge.  Had a colonoscopy.    GYNECOLOGIC HISTORY: Patient's last menstrual period was 01/10/2005 (approximate). Contraception:  PMP Menopausal hormone therapy:  none Last mammogram:  06-25-20 3D/Neg/BiRads1 Last pap smear:  06-28-18 Neg, 02-18-16 Neg, 01-23-14 Neg        OB History     Gravida  2   Para  2   Term      Preterm      AB      Living  2      SAB      IAB      Ectopic      Multiple      Live Births                 Patient Active Problem List   Diagnosis Date Noted   GAD (generalized anxiety disorder) 11/13/2017    Past Medical History:  Diagnosis Date   Anxiety    Bipolar 1 disorder ([place])    Breast mass, left    Cancer ([place])    hx of skin cancer   Confusion    Depression    Eczema 2021   Fibroid    GERD (gastroesophageal reflux disease)    History of colon polyps    Hyperparathyroidism    Hypothyroidism    Leg lesion    Osteopenia    Restless leg    Thyroid disease    UTI (lower urinary tract infection)     Past Surgical History:  Procedure Laterality Date   BREAST LUMPECTOMY WITH RADIOACTIVE SEED LOCALIZATION Left 12/28/2015   Procedure: LEFT BREAST LUMPECTOMY WITH RADIOACTIVE SEED LOCALIZATION;  Surgeon: Coralie Keens, MD;  Location: [facility];  Service: General;  Laterality: Left;   BREAST SURGERY     breast biopsy ([facility])   DILATION AND CURETTAGE OF UTERUS      HYSTEROSCOPY WITH D & C  2006   postmenopausal bleeding, endometrial polyp   LIPOSUCTION TRUNK     and placed fat tissue in vocal cords   SKIN CANCER EXCISION     hands and thigh   THYROID SURGERY  2011, 2004   Para Thyroid   TONSILLECTOMY      Current Outpatient Medications  Medication Sig Dispense Refill   Calcium Citrate (CITRACAL PO) Take by mouth 2 (two) times daily.      Cholecalciferol 25 MCG (1000 UT) capsule Take 1 tablet by mouth every other day.     levothyroxine (SYNTHROID) 88 MCG tablet Take 1 tablet (88 mcg total) by mouth daily before breakfast. 30 tablet 11   lidocaine (XYLOCAINE) 2 % jelly USE AS NEEDED FOR PAINFUL HEMORROIDS THREE TIMES DAILY 30 mL 1   lithium carbonate 150 MG capsule Take one capsule by mouth every morning and two capsules at bedtime 270 capsule 1  loratadine (CLARITIN) 10 MG tablet Take 10 mg by mouth daily.     LORazepam (ATIVAN) 0.5 MG tablet Take 1 tablet (0.5 mg total) by mouth at bedtime. 90 tablet 0   Melatonin 10 MG CAPS Take 1 tablet by mouth at bedtime.     mirtazapine (REMERON) 7.5 MG tablet Take 1 tablet (7.5 mg total) by mouth at bedtime. 90 tablet 0   omeprazole (PRILOSEC) 40 MG capsule Take 40 mg by mouth daily.  2   psyllium (METAMUCIL) 58.6 % packet Take by mouth.     QC PINK BISMUTH 262 MG chewable tablet PER pack instructions AS NEEDED diarrhea     No current facility-administered medications for this visit.     ALLERGIES: Other  Family History  Problem Relation Age of Onset   Alzheimer's disease Mother    Stroke Mother    Hypertension Mother    Diabetes Father    Alzheimer's disease Father    Cancer Father        colon   Breast cancer Sister     Social History   Socioeconomic History   Marital status: Widowed    Spouse name: Not on file   Number of children: Not on file   Years of education: Not on file   Highest education level: Not on file  Occupational History   Not on file  Tobacco Use   Smoking  status: Never   Smokeless tobacco: Never  Vaping Use   Vaping Use: Never used  Substance and Sexual Activity   Alcohol use: Yes    Alcohol/week: 1.0 standard drink    Types: 1 Glasses of wine per week   Drug use: No   Sexual activity: Not Currently    Partners: Male    Birth control/protection: Post-menopausal  Other Topics Concern   Not on file  Social History Narrative   Not on file   Social Determinants of Health   Financial Resource Strain: Not on file  Food Insecurity: Not on file  Transportation Needs: Not on file  Physical Activity: Not on file  Stress: Not on file  Social Connections: Not on file  Intimate Partner Violence: Not on file    Review of Systems  All other systems reviewed and are negative.  PHYSICAL EXAMINATION:    BP (!) 164/70   Pulse 76   Resp 16   Ht '5\' 5"'$  (1.651 m)   Wt 125 lb (56.7 kg)   LMP 01/10/2005 (Approximate)   BMI 20.80 kg/m     General appearance: alert, cooperative and appears stated age Head: Normocephalic, without obvious abnormality, atraumatic Lungs: clear to auscultation bilaterally Breasts: normal appearance, no masses or tenderness, No nipple retraction or dimpling, No nipple discharge or bleeding, No axillary or supraclavicular adenopathy Heart: regular rate and rhythm Abdomen: soft, non-tender, no masses,  no organomegaly Extremities: extremities normal, atraumatic, no cyanosis or edema Skin: Skin color, texture, turgor normal. No rashes or lesions Lymph nodes: Cervical, supraclavicular, and axillary nodes normal. No abnormal inguinal nodes palpated  Pelvic: External genitalia:  no lesions              Urethra:  normal appearing urethra with no masses, tenderness or lesions              Bartholins and Skenes: normal                 Vagina: normal appearing vagina with normal color and discharge, no lesions  Cervix: no lesions                Bimanual Exam:  Uterus:  normal size, contour, position,  consistency, mobility, non-tender              Adnexa: no mass, fullness, tenderness              Rectal exam: yes..  Confirms.              Anus:  normal sphincter tone, no lesions  Chaperone was present for exam:  Estill Bamberg, CMA  ASSESSMENT  Well woman visit with GYN exam.  Cervical cancer screening.  Vaginal atrophy.  Urinary incontinence.  Probable overactive bladder. Hx left breast mass.  Benign biopsy.  FH of twin sister with breast cancer.  FH of colon cancer.  Osteoporosis.  Endocrinology managing. Hyperparathyroidism.   PLAN  Pap and reflex HR HPV. Mammogram yearly.  SBE encouraged.  Refill of vaginal estrogen cream.   I discussed potential effect on breast cancer. We discussed bladder irritants.  She declines PVR check and medication for overactive bladder.  BMD in 2023.  Breast and pelvic exam in 2 years. If needs refill of medication, will return next year.    An After Visit Summary was printed and given to the patient.  24 min  total time was spent for this patient encounter, including preparation, face-to-face counseling with the patient, coordination of care, and documentation of the encounter.

## 2020-09-10 LAB — CYTOLOGY - PAP: Diagnosis: NEGATIVE

## 2020-09-15 ENCOUNTER — Other Ambulatory Visit: Payer: Self-pay

## 2020-09-15 ENCOUNTER — Encounter: Payer: Self-pay | Admitting: Psychiatry

## 2020-09-15 ENCOUNTER — Ambulatory Visit (INDEPENDENT_AMBULATORY_CARE_PROVIDER_SITE_OTHER): Payer: Medicare Other | Admitting: Psychiatry

## 2020-09-15 DIAGNOSIS — F411 Generalized anxiety disorder: Secondary | ICD-10-CM | POA: Diagnosis not present

## 2020-09-15 DIAGNOSIS — F5105 Insomnia due to other mental disorder: Secondary | ICD-10-CM

## 2020-09-15 DIAGNOSIS — Z79899 Other long term (current) drug therapy: Secondary | ICD-10-CM | POA: Diagnosis not present

## 2020-09-15 DIAGNOSIS — F3181 Bipolar II disorder: Secondary | ICD-10-CM

## 2020-09-15 MED ORDER — MIRTAZAPINE 15 MG PO TABS: 15.0000 mg | ORAL_TABLET | Freq: Every day | ORAL | 0 refills | Status: DC

## 2020-09-15 NOTE — Patient Instructions (Addendum)
Get lithium level test  Start taking 1 of the 15 mg mirtazapine tablets at night

## 2020-09-15 NOTE — Progress Notes (Signed)
MARYLISA KALIVAS [id number] [birth year] 75 y.o.   Subjective:   [name] is a 75 y.o. (DOB [birth year]) female.  Chief Complaint:  Chief Complaint  Patient presents with   Follow-up   Bipolar II disorder ([place])   Sleeping Problem    Anxiety Symptoms include decreased concentration and nausea. Patient reports no confusion, dizziness, nervous/anxious behavior, palpitations or suicidal ideas.    Tonye Pearson Boorman presents to the office today for follow-up of mood, anxiety and STM problems.    seen November 2020.  The following was noted: She moved to this appointment earlier. D in on the appt [name]. Not feeling well for over a week.  Staying with [name] for a week.   Problems with diarrhea.  Using immodium. More tremor and confusion, poor memory, and poor handwriting.  D notes sleeping a lot and is cold.   Has stayed well re: viral illness.  Has been able to still meet with 8 neighbors and that has continued to help her mental health.   Notices more trouble with word-finding and fluency and that bothers her.   Things better lately with D, this has been the chronic stressor for years.  Doesn't know why but pleased.  Disc other stressors with a recent friend when she tried to reconcile with him and he refused. No meds were changed.  05/10/2019 appointment, the following is noted: Disc lithium level variations from November 0.7 to April 0.4 with one in between of 1.7 at [name] office without any changes in dosages.  No explanation for that change. Had been dealing with diarrhea but [name] changed med added Florastor which helped.  Worries about the cat not sleeping with her.   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  Struggling to learn a new phone.  Can do Facebook. No med change.  08/05/19 appt with the following noted: Up and down.  Recent period of confusion.  Had a problem with one of neighbors. Asked for daughter's help and she came by to help.  No problems driving.  Word finding issues. Does her own shopping and pays bills. She went for follow up to labs July 6 and did not have UTI. Not anxious since being more social.  Not as easily upset. On same doses of lorazepam 0.5 mg HS, mirtazapine 7.5 mg HS, lithium 150 mg 1 cap AM and 2 HS. Some awakening hungry at night lately. Plan: no med changes  11/27/19 appt with following noted: Problems with GI vomiting diarrhea, skin problems with med changes.  Skin problem is better some with topicals.  N and diarrhea might be stress related bc is better now that keeps her distance from stressful neighbor.   They wondered about her stopping all meds to reevaluate. On same doses of lorazepam 0.5 mg HS, mirtazapine 7.5 mg HS, lithium 150 mg 1 cap AM and 2 HS. Sleep is good with meds.    Plan no med changes but check labs  03/11/2020 appointment with the following noted: Labs were checked and noted.  FU endocrinology tomorrow  No SE with meds. Still walking with other women. Covid and weather limit mood.  Scrapbooking helps.  GS visits and helps her with things. Needs colonoscopy.  Hip pain. Less confusion.  But can be a little forgetful.  Still can manage her own activities. Doesn't like to cook.  Likes her apt.  Plan: no med changes  06/10/20 appt with following noted: Neighbor group has split up.  New resident  came in and split things up.  Colonoscopy pending for August bc concerns  Raised at PE. Sleep not as good.  Problems with neighbors bothers her sleep.  Delayed sleep phase  Went to [place] SE with meds Plan: Disc alternative sleeper.  First try increase mirtazapine to 15 mg HS to see if sleep better. Disc alternative sleeper.  First try increase mirtazapine to 15 mg HS to see if sleep better.   09/15/2020 appointment with the following noted: Patient has had some confusion around how to take the mirtazapine.  At times she has had 15 mg tablets and other x7.5 mg tablets and apparently  has taken up to 30 mg. Been behaving and staying out of trouble.  Saw family last weekend and had a good time. Has been taking mirtazapine 15 as 2 of the 7.5 mg tablets and sleeping well.   Lost medical doctors, [name] and [name].  Patient reports stable mood and denies depressed or irritable moods.  Patient has recent difficulty with anxiety over situation.  Patient denies difficulty with sleep initiation or maintenance. Pleased with sleep.  Takes OTC sleep aid and needs it to sleep.  No hangover. Denies appetite disturbance.  Patient reports that energy and motivation have been good.  Patient worsening difficulty with concentration and memory.  Gets times confused.  No missed appts..  Patient denies any suicidal ideation.  Saw [name] in January and the labs and he had no concerns about her labs.  Normal EKG  Past Psychiatric Medication Trials: Lamotrigine, Depakote, lithium, olanzapine 2.5 Buspar dizzy,  Benadryl HS helps sleep,  Willbutrin , mirtazapine 7.5,  NAC cut out bc NR noted and doesn't like so many pills.  Trazodone,    Review of Systems:  Review of Systems  HENT:  Positive for voice change.   Cardiovascular:  Negative for palpitations.  Gastrointestinal:  Positive for diarrhea and nausea.  Musculoskeletal:  Positive for arthralgias, back pain and gait problem.       Sees chiropracter  Skin:  Positive for rash.  Neurological:  Negative for dizziness, tremors, weakness and headaches.       Balance isn't great. No recent falls.  Psychiatric/Behavioral:  Positive for decreased concentration. Negative for agitation, behavioral problems, confusion, dysphoric mood, hallucinations, self-injury, sleep disturbance and suicidal ideas. The patient is not nervous/anxious and is not hyperactive.    Medications: I have reviewed the patient's current medications.  Current Outpatient Medications  Medication Sig Dispense Refill   Calcium Citrate (CITRACAL PO) Take by mouth 2 (two)  times daily.      Cholecalciferol 25 MCG (1000 UT) capsule Take 1 tablet by mouth every other day.     estradiol (ESTRACE) 0.1 MG/GM vaginal cream Use 1/2 g vaginally two or three times per week as needed to maintain symptom relief. 42.5 g 1   levothyroxine (SYNTHROID) 88 MCG tablet Take 1 tablet (88 mcg total) by mouth daily before breakfast. 30 tablet 11   lidocaine (XYLOCAINE) 2 % jelly USE AS NEEDED FOR PAINFUL HEMORROIDS THREE TIMES DAILY 30 mL 1   lithium carbonate 150 MG capsule Take one capsule by mouth every morning and two capsules at bedtime 270 capsule 1   LORazepam (ATIVAN) 0.5 MG tablet Take 1 tablet (0.5 mg total) by mouth at bedtime. 90 tablet 0   Melatonin 10 MG CAPS Take 1 tablet by mouth at bedtime.     mirtazapine (REMERON) 7.5 MG tablet Take 1 tablet (7.5 mg total) by mouth at bedtime.  90 tablet 0   omeprazole (PRILOSEC) 40 MG capsule Take 40 mg by mouth daily.  2   psyllium (METAMUCIL) 58.6 % packet Take by mouth.     QC PINK BISMUTH 262 MG chewable tablet PER pack instructions AS NEEDED diarrhea     loratadine (CLARITIN) 10 MG tablet Take 10 mg by mouth daily. (Patient not taking: Reported on 09/15/2020)     No current facility-administered medications for this visit.    Medication Side Effects: None  Allergies:  Allergies  Allergen Reactions   Other Other (See Comments)    Allergic rhinitis symptoms     Past Medical History:  Diagnosis Date   Anxiety    Bipolar 1 disorder ([facility])    Breast mass, left    Cancer ([facility])    hx of skin cancer   Confusion    Depression    Eczema 2021   Fibroid    GERD (gastroesophageal reflux disease)    History of colon polyps    Hyperparathyroidism    Hypothyroidism    Leg lesion    Osteopenia    Restless leg    Thyroid disease    UTI (lower urinary tract infection)     Family History  Problem Relation Age of Onset   Alzheimer's disease Mother    Stroke Mother    Hypertension Mother    Diabetes Father     Alzheimer's disease Father    Cancer Father        colon   Breast cancer Sister     Social History   Socioeconomic History   Marital status: Widowed    Spouse name: Not on file   Number of children: Not on file   Years of education: Not on file   Highest education level: Not on file  Occupational History   Not on file  Tobacco Use   Smoking status: Never   Smokeless tobacco: Never  Vaping Use   Vaping Use: Never used  Substance and Sexual Activity   Alcohol use: Yes    Alcohol/week: 1.0 standard drink    Types: 1 Glasses of wine per week   Drug use: No   Sexual activity: Not Currently    Partners: Male    Birth control/protection: Post-menopausal  Other Topics Concern   Not on file  Social History Narrative   Not on file   Social Determinants of Health   Financial Resource Strain: Not on file  Food Insecurity: Not on file  Transportation Needs: Not on file  Physical Activity: Not on file  Stress: Not on file  Social Connections: Not on file  Intimate Partner Violence: Not on file    Past Medical History, Surgical history, Social history, and Family history were reviewed and updated as appropriate.   Please see review of systems for further details on the patient's review from today.   Objective:   Physical Exam:  LMP 01/10/2005 (Approximate)   Physical Exam Constitutional:      General: She is not in acute distress. Musculoskeletal:        General: No deformity.  Neurological:     Mental Status: She is alert and oriented to person, place, and time.     Motor: No tremor.     Coordination: Coordination normal.     Gait: Gait normal.  Psychiatric:        Attention and Perception: Attention and perception normal. She is attentive. She does not perceive auditory or visual hallucinations.  Mood and Affect: Mood is not anxious or depressed. Affect is not labile, blunt, angry or inappropriate.        Speech: Speech normal. Speech is not rapid and  pressured.        Behavior: Behavior normal. Behavior is not slowed.        Thought Content: Thought content normal. Thought content is not paranoid or delusional. Thought content does not include homicidal or suicidal ideation. Thought content does not include homicidal or suicidal plan.        Cognition and Memory: Memory normal. Cognition is impaired.        Judgment: Judgment normal.     Comments: Neat. Insight and judgment fair. Mild forgetfulness and cognitive problems ongoing  Successful stopping lorazepam    Lab Review:     Component Value Date/Time   NA 142 07/16/2019 1453   K 3.9 07/16/2019 1453   CL 106 07/16/2019 1453   CO2 30 07/16/2019 1453   GLUCOSE 116 07/16/2019 1453   BUN 11 07/16/2019 1453   CREATININE 0.67 07/16/2019 1453   CALCIUM 10.9 (H) 07/16/2019 1453   GFRNONAA >90 09/11/2012 1330   GFRAA >90 09/11/2012 1330       Component Value Date/Time   WBC 8.3 09/11/2012 1330   RBC 4.45 09/11/2012 1330   HGB 13.7 09/11/2012 1330   HCT 42.4 09/11/2012 1330   PLT 186 09/11/2012 1330   MCV 95.3 09/11/2012 1330   MCH 30.8 09/11/2012 1330   MCHC 32.3 09/11/2012 1330   RDW 13.4 09/11/2012 1330   LYMPHSABS 1.1 09/11/2012 1330   MONOABS 0.6 09/11/2012 1330   EOSABS 0.1 09/11/2012 1330   BASOSABS 0.0 09/11/2012 1330    Lithium Lvl  Date Value Ref Range Status  07/16/2019 0.5 (L) 0.6 - 1.2 mmol/L Final    Component 03/09/20 06/12/19 11/17/16  Lithium Level 0.6 0.6 0.6    No results found for: PHENYTOIN, PHENOBARB, VALPROATE, CBMZ   Bone density test osteoporosis  Hips and spine.  Labs from January 6 included unremarkable CBC, comprehensive metabolic test unremarkable except for calcium 10.9 which is stable high.  Creatinine was 0.75 TSH was normal at 2.18, vitamin D was normal at 43.8, li thium level stable at 0.6 urinalysis unremarkable  .res Assessment: Plan:    Bipolar II disorder ([place])  Insomnia due to mental condition  Generalized anxiety  disorder  Lithium use   Borderline mild cognitive impairment but she is able to care for herself adequately. Episodically worse without known triggers.   She feels her mood and anxiety and sleep are pretty stable except as noted.  She has been on lithium for many years with good response.  Insomnia is managed with medications at [facility].  Greater than 50% of  30 min face to face time with patient was spent on counseling and coordination of care. Counseled patient regarding potential benefits, risks, and side effects of lithium to include potential risk of lithium affecting thyroid and renal functon.  Discussed need for periodic lab monitoring to determine drug level and to assess for potential adverse effects.  Counseled patient regarding signs and symptoms of lithium toxicity and advised that they notify office immediately or seek urgent medical attention if experiencing these signs and symptoms.  Patient advised to contact office with any questions or concerns.  She has a history of hyperparathyroidism and is aware that lithium can increase calcium levels.  At her age it would be very risky to attempt to find an alternative  mood stabilizer when lithium has been effective.  Alternatives would expose her to different types of side effects such as those with the atypical antipsychotics including tardive dyskinesia.  We discussed the short-term risks associated with benzodiazepines including sedation and increased fall risk among others.  Discussed long-term side effect risk including dependence, potential withdrawal symptoms, and the potential eventual dose-related risk of dementia.  Also discussed concerns about the Benadryl that she is taking at night for sleep which may adversely affect her memory as well.  Her cognition is declining a bit more but she is functional and OK with self care. Disc cognitive risks with OTC sleep aids.  Keep them at a minimum if possible.     Disc Covid testing and her  questions about it.  No current sx.  Sleep better with mirtazapine 15 mg HS. She has had 15 mg size tablets and 7.5 mg sized tablets and has gotten somewhat confused about it.  Explained this in detail to her today. Lithium 150 mg 1 in the morning and 2 at night Lorazepam successful at DC of  0.5 mg nightly  Specifically stopping lithium which has helped her for decades would be highly risky for significant mood destabilization which could last for months and expose her to risks of atypical antipsychotics which would be the alternative to lithium.  She's already failed alternative of Depakote. She doesn't want to change the lithium.  03/09/2020 lithium 0.6 stable, creatinine 0.69, calcium 10.4, ionized calcium slightly elevated 1.34, TSH 2.55 Repeat lithium level every 6 mos bc is on low dose with low level.  Need to get it now.  FU 3-4 mos  Lynder Parents, MD, DFAPA   Please see After Visit Summary for patient specific instructions. Get lithium level test  Start taking 1 of the 15 mg mirtazapine tablets at night  No future appointments.   No orders of the defined types were placed in this encounter.     -------------------------------

## 2020-09-21 DIAGNOSIS — Z23 Encounter for immunization: Secondary | ICD-10-CM | POA: Diagnosis not present

## 2020-10-05 DIAGNOSIS — Z23 Encounter for immunization: Secondary | ICD-10-CM | POA: Diagnosis not present

## 2020-10-20 DIAGNOSIS — L309 Dermatitis, unspecified: Secondary | ICD-10-CM | POA: Diagnosis not present

## 2020-10-20 DIAGNOSIS — L853 Xerosis cutis: Secondary | ICD-10-CM | POA: Diagnosis not present

## 2020-10-20 DIAGNOSIS — L858 Other specified epidermal thickening: Secondary | ICD-10-CM | POA: Diagnosis not present

## 2020-10-30 DIAGNOSIS — S41112A Laceration without foreign body of left upper arm, initial encounter: Secondary | ICD-10-CM | POA: Diagnosis not present

## 2020-11-02 DIAGNOSIS — E210 Primary hyperparathyroidism: Secondary | ICD-10-CM | POA: Diagnosis not present

## 2020-11-02 DIAGNOSIS — M810 Age-related osteoporosis without current pathological fracture: Secondary | ICD-10-CM | POA: Diagnosis not present

## 2020-11-02 DIAGNOSIS — E890 Postprocedural hypothyroidism: Secondary | ICD-10-CM | POA: Diagnosis not present

## 2020-11-02 DIAGNOSIS — F319 Bipolar disorder, unspecified: Secondary | ICD-10-CM | POA: Diagnosis not present

## 2020-11-03 DIAGNOSIS — E210 Primary hyperparathyroidism: Secondary | ICD-10-CM | POA: Diagnosis not present

## 2020-11-03 DIAGNOSIS — F319 Bipolar disorder, unspecified: Secondary | ICD-10-CM | POA: Diagnosis not present

## 2020-11-03 DIAGNOSIS — Z79890 Hormone replacement therapy (postmenopausal): Secondary | ICD-10-CM | POA: Diagnosis not present

## 2020-11-03 DIAGNOSIS — E890 Postprocedural hypothyroidism: Secondary | ICD-10-CM | POA: Diagnosis not present

## 2020-11-03 DIAGNOSIS — M810 Age-related osteoporosis without current pathological fracture: Secondary | ICD-10-CM | POA: Diagnosis not present

## 2020-11-09 ENCOUNTER — Other Ambulatory Visit: Payer: Self-pay

## 2020-11-09 ENCOUNTER — Telehealth: Payer: Self-pay | Admitting: Psychiatry

## 2020-11-09 DIAGNOSIS — F3181 Bipolar II disorder: Secondary | ICD-10-CM

## 2020-11-09 DIAGNOSIS — Z79899 Other long term (current) drug therapy: Secondary | ICD-10-CM

## 2020-11-09 MED ORDER — LITHIUM CARBONATE 150 MG PO CAPS: ORAL_CAPSULE | ORAL | 0 refills | Status: DC

## 2020-11-09 NOTE — Telephone Encounter (Signed)
Pt called and needs a refill on her lithium 150 mg. Her pharmacy closed and so a new script needs to be sent to [facility] located at [address]. Phone number [phone]

## 2020-11-09 NOTE — Telephone Encounter (Signed)
Rx sent 

## 2020-12-15 DIAGNOSIS — E890 Postprocedural hypothyroidism: Secondary | ICD-10-CM | POA: Diagnosis not present

## 2020-12-15 DIAGNOSIS — F319 Bipolar disorder, unspecified: Secondary | ICD-10-CM | POA: Diagnosis not present

## 2020-12-16 ENCOUNTER — Other Ambulatory Visit: Payer: Self-pay

## 2020-12-16 ENCOUNTER — Encounter: Payer: Self-pay | Admitting: Psychiatry

## 2020-12-16 ENCOUNTER — Ambulatory Visit (INDEPENDENT_AMBULATORY_CARE_PROVIDER_SITE_OTHER): Payer: Medicare Other | Admitting: Psychiatry

## 2020-12-16 DIAGNOSIS — F411 Generalized anxiety disorder: Secondary | ICD-10-CM | POA: Diagnosis not present

## 2020-12-16 DIAGNOSIS — F5105 Insomnia due to other mental disorder: Secondary | ICD-10-CM

## 2020-12-16 DIAGNOSIS — Z79899 Other long term (current) drug therapy: Secondary | ICD-10-CM

## 2020-12-16 DIAGNOSIS — F3181 Bipolar II disorder: Secondary | ICD-10-CM | POA: Diagnosis not present

## 2020-12-16 NOTE — Progress Notes (Signed)
Amy [name] [id number] August 05, 1945 75 y.o.   Subjective:   [name] is a 75 y.o. (DOB 1945-05-03) female.  Chief Complaint:  Chief Complaint  Patient presents with   Follow-up    Bipolar II disorder ([place])   Anxiety    Anxiety Symptoms include decreased concentration. Patient reports no confusion, dizziness, nausea, nervous/anxious behavior, palpitations or suicidal ideas.    Amy [name] presents to the office today for follow-up of mood, anxiety and STM problems.    seen November 2020.  The following was noted: She moved to this appointment earlier. D in on the appt [name]. Not feeling well for over a week.  Staying with [name] for a week.   Problems with diarrhea.  Using immodium. More tremor and confusion, poor memory, and poor handwriting.  D notes sleeping a lot and is cold.   Has stayed well re: viral illness.  Has been able to still meet with 8 neighbors and that has continued to help her mental health.   Notices more trouble with word-finding and fluency and that bothers her.   Things better lately with D, this has been the chronic stressor for years.  Doesn't know why but pleased.  Disc other stressors with a recent friend when she tried to reconcile with him and he refused. No meds were changed.  05/10/2019 appointment, the following is noted: Disc lithium level variations from November 0.7 to April 0.4 with one in between of 1.7 at [name] office without any changes in dosages.  No explanation for that change. Had been dealing with diarrhea but [name] changed med added Florastor which helped.  Worries about the cat not sleeping with her.   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  Struggling to learn a new phone.  Can do Facebook. No med change.  08/05/19 appt with the following noted: Up and down.  Recent period of confusion.  Had a problem with one of neighbors. Asked for daughter's help and she came by to help. No problems  driving.  Word finding issues. Does her own shopping and pays bills. She went for follow up to labs July 6 and did not have UTI. Not anxious since being more social.  Not as easily upset. On same doses of lorazepam 0.5 mg HS, mirtazapine 7.5 mg HS, lithium 150 mg 1 cap AM and 2 HS. Some awakening hungry at night lately. Plan: no med changes  11/27/19 appt with following noted: Problems with GI vomiting diarrhea, skin problems with med changes.  Skin problem is better some with topicals.  N and diarrhea might be stress related bc is better now that keeps her distance from stressful neighbor.   They wondered about her stopping all meds to reevaluate. On same doses of lorazepam 0.5 mg HS, mirtazapine 7.5 mg HS, lithium 150 mg 1 cap AM and 2 HS. Sleep is good with meds.    Plan no med changes but check labs  03/11/2020 appointment with the following noted: Labs were checked and noted.  FU endocrinology tomorrow  No SE with meds. Still walking with other women. Covid and weather limit mood.  Scrapbooking helps.  GS visits and helps her with things. Needs colonoscopy.  Hip pain. Less confusion.  But can be a little forgetful.  Still can manage her own activities. Doesn't like to cook.  Likes her apt.  Plan: no med changes  06/10/20 appt with following noted: Neighbor group has split up.  New resident came  in and split things up.  Colonoscopy pending for August bc concerns  Raised at [facility]. Sleep not as good.  Problems with neighbors bothers her sleep.  Delayed sleep phase  Went to [place] No SE with meds Plan: Disc alternative sleeper.  First try increase mirtazapine to 15 mg HS to see if sleep better. Disc alternative sleeper.  First try increase mirtazapine to 15 mg HS to see if sleep better.   09/15/2020 appointment with the following noted: Patient has had some confusion around how to take the mirtazapine.  At times she has had 15 mg tablets and other x7.5 mg tablets and apparently has taken up  to 30 mg. Been behaving and staying out of trouble.  Saw family last weekend and had a good time. Has been taking mirtazapine 15 as 2 of the 7.5 mg tablets and sleeping well.   Lost medical doctors, [name] and [name]. Plan: Check lithium level No med changes  12/16/2020 appointment with the following noted: Disc medical visits. Less socialization DT weather.  But is walking with 2 other ladies. Hopes for frequent visits with family.  Not as regular visits as she would like but did see them at Thanksgiving.  Lonely right now. Ongoing GI problems and plans to see a doctor  Patient reports stable mood and denies depressed or irritable moods.  Patient has some chronic difficulty with anxiety situationally but not worse.  Patient denies difficulty with sleep initiation or maintenance. Pleased with sleep.  Takes OTC sleep aid and needs it to sleep.  No hangover. Denies appetite disturbance.  Patient reports that energy and motivation have been good.  Patient worsening difficulty with concentration and memory.  Gets times confused.  No missed appts..  Patient denies any suicidal ideation.  Saw [name] in January and the labs and he had no concerns about her labs.  Normal EKG  Past Psychiatric Medication Trials: Lamotrigine, Depakote, lithium, olanzapine 2.5 Buspar dizzy,  Benadryl HS helps sleep, mirtazapine 15, Trazodone,  Wellbutrin ,  NAC cut out bc NR noted and doesn't like so many pills.     Review of Systems:  Review of Systems  HENT:  Positive for voice change.   Cardiovascular:  Negative for palpitations.  Gastrointestinal:  Positive for diarrhea. Negative for nausea.  Musculoskeletal:  Positive for arthralgias and back pain. Negative for gait problem.       Sees chiropracter  Skin:  Positive for rash.  Neurological:  Negative for dizziness, tremors, weakness and headaches.       Balance isn't great. No recent falls.  Psychiatric/Behavioral:  Positive for decreased  concentration. Negative for agitation, behavioral problems, confusion, dysphoric mood, hallucinations, self-injury, sleep disturbance and suicidal ideas. The patient is not nervous/anxious and is not hyperactive.    Medications: I have reviewed the patient's current medications.  Current Outpatient Medications  Medication Sig Dispense Refill   Calcium Citrate (CITRACAL PO) Take by mouth 2 (two) times daily.      Cholecalciferol 25 MCG (1000 UT) capsule Take 1 tablet by mouth every other day.     estradiol (ESTRACE) 0.1 MG/GM vaginal cream Use 1/2 g vaginally two or three times per week as needed to maintain symptom relief. 42.5 g 1   levothyroxine (SYNTHROID) 88 MCG tablet Take 1 tablet (88 mcg total) by mouth daily before breakfast. 30 tablet 11   lidocaine (XYLOCAINE) 2 % jelly USE AS NEEDED FOR PAINFUL HEMORROIDS THREE TIMES DAILY 30 mL 1   lithium carbonate 150 MG  capsule Take one capsule by mouth every morning and two capsules at bedtime 270 capsule 0   Melatonin 10 MG CAPS Take 1 tablet by mouth at bedtime.     mirtazapine (REMERON) 15 MG tablet Take 1 tablet (15 mg total) by mouth at bedtime. 90 tablet 0   omeprazole (PRILOSEC) 40 MG capsule Take 40 mg by mouth daily.  2   psyllium (METAMUCIL) 58.6 % packet Take by mouth.     loratadine (CLARITIN) 10 MG tablet Take 10 mg by mouth daily. (Patient not taking: Reported on 12/16/2020)     QC PINK BISMUTH 262 MG chewable tablet PER pack instructions AS NEEDED diarrhea (Patient not taking: Reported on 12/16/2020)     No current facility-administered medications for this visit.    Medication Side Effects: None  Allergies:  Allergies  Allergen Reactions   Other Other (See Comments)    Allergic rhinitis symptoms     Past Medical History:  Diagnosis Date   Anxiety    Bipolar 1 disorder ([facility])    Breast mass, left    Cancer ([facility])    hx of skin cancer   Confusion    Depression    Eczema 2021   Fibroid    GERD (gastroesophageal  reflux disease)    History of colon polyps    Hyperparathyroidism    Hypothyroidism    Leg lesion    Osteopenia    Restless leg    Thyroid disease    UTI (lower urinary tract infection)     Family History  Problem Relation Age of Onset   Alzheimer's disease Mother    Stroke Mother    Hypertension Mother    Diabetes Father    Alzheimer's disease Father    Cancer Father        colon   Breast cancer Sister     Social History   Socioeconomic History   Marital status: Widowed    Spouse name: Not on file   Number of children: Not on file   Years of education: Not on file   Highest education level: Not on file  Occupational History   Not on file  Tobacco Use   Smoking status: Never   Smokeless tobacco: Never  Vaping Use   Vaping Use: Never used  Substance and Sexual Activity   Alcohol use: Yes    Alcohol/week: 1.0 standard drink    Types: 1 Glasses of wine per week   Drug use: No   Sexual activity: Not Currently    Partners: Male    Birth control/protection: Post-menopausal  Other Topics Concern   Not on file  Social History Narrative   Not on file   Social Determinants of Health   Financial Resource Strain: Not on file  Food Insecurity: Not on file  Transportation Needs: Not on file  Physical Activity: Not on file  Stress: Not on file  Social Connections: Not on file  Intimate Partner Violence: Not on file    Past Medical History, Surgical history, Social history, and Family history were reviewed and updated as appropriate.   Please see review of systems for further details on the patient's review from today.   Objective:   Physical Exam:  LMP 01/10/2005 (Approximate)   Physical Exam Constitutional:      General: She is not in acute distress. Musculoskeletal:        General: No deformity.  Neurological:     Mental Status: She is alert and oriented to person, place,  and time.     Motor: No tremor.     Coordination: Coordination normal.      Gait: Gait normal.  Psychiatric:        Attention and Perception: Attention and perception normal. She is attentive. She does not perceive auditory or visual hallucinations.        Mood and Affect: Mood is not anxious or depressed. Affect is not labile, blunt, angry or inappropriate.        Speech: Speech normal. Speech is not rapid and pressured.        Behavior: Behavior normal. Behavior is not slowed.        Thought Content: Thought content normal. Thought content is not paranoid or delusional. Thought content does not include homicidal or suicidal ideation.        Cognition and Memory: Memory normal. Cognition is impaired.        Judgment: Judgment normal.     Comments: Neat. Insight and judgment fair. Mild forgetfulness and cognitive problems ongoing but stable and not worse over 2022     Lab Review:     Component Value Date/Time   NA 142 07/16/2019 1453   K 3.9 07/16/2019 1453   CL 106 07/16/2019 1453   CO2 30 07/16/2019 1453   GLUCOSE 116 07/16/2019 1453   BUN 11 07/16/2019 1453   CREATININE 0.67 07/16/2019 1453   CALCIUM 10.9 (H) 07/16/2019 1453   GFRNONAA >90 09/11/2012 1330   GFRAA >90 09/11/2012 1330       Component Value Date/Time   WBC 8.3 09/11/2012 1330   RBC 4.45 09/11/2012 1330   HGB 13.7 09/11/2012 1330   HCT 42.4 09/11/2012 1330   PLT 186 09/11/2012 1330   MCV 95.3 09/11/2012 1330   MCH 30.8 09/11/2012 1330   MCHC 32.3 09/11/2012 1330   RDW 13.4 09/11/2012 1330   LYMPHSABS 1.1 09/11/2012 1330   MONOABS 0.6 09/11/2012 1330   EOSABS 0.1 09/11/2012 1330   BASOSABS 0.0 09/11/2012 1330    Lithium Lvl  Date Value Ref Range Status  07/16/2019 0.5 (L) 0.6 - 1.2 mmol/L Final    Component 12/15/20 11/02/20 06/30/20 03/09/20 06/12/19 11/17/16  Lithium Level 0.5 Low   0.5 Low   0.6  0.6 0.6 0.6   Component 12/15/20 11/02/20 06/30/20 03/09/20 10/14/19 06/12/19  TSH 0.27 Low   0.06 Low   0.95  2.550  0.608  2.794    11/02/2020 calcium 10.8, creatinine  0.62, vitamin D 97 on 1000 U daily at [place]  No results found for: PHENYTOIN, PHENOBARB, VALPROATE, CBMZ   Bone density test osteoporosis  Hips and spine.    .res Assessment: Plan:    Bipolar II disorder ([place])  Generalized anxiety disorder  Insomnia due to mental condition  Lithium use    Greater than 50% of  30 min face to face time with patient was spent on counseling and coordination of care. Counseled patient regarding potential benefits, risks, and side effects of lithium to include potential risk of lithium affecting thyroid and renal functon.  Discussed need for periodic lab monitoring to determine drug level and to assess for potential adverse effects.  Counseled patient regarding signs and symptoms of lithium toxicity and advised that they notify office immediately or seek urgent medical attention if experiencing these signs and symptoms.  Patient advised to contact office with any questions or concerns.  She has a history of hyperparathyroidism and is aware that lithium can increase calcium levels.  At her age it would be very risky to attempt to find an alternative mood stabilizer when lithium has been effective.  Alternatives would expose her to different types of side effects such as those with the atypical antipsychotics including tardive dyskinesia.  Borderline mild cognitive impairment but she is able to care for herself adequately. Episodically worse without known triggers.  But appears stable with no worsening over this 2022.   She feels her mood and anxiety and sleep are pretty stable except as noted.  She has been on lithium for many years with good response.  Specifically stopping lithium which has helped her for decades would be highly risky for significant mood destabilization which could last for months and expose her to risks of atypical antipsychotics which would be the alternative to lithium.  She's already failed alternative of Depakote. She  doesn't want to change the lithium.  Insomnia is managed with medications at [facility].  We discussed the short-term risks associated with benzodiazepines including sedation and increased fall risk among others.  Discussed long-term side effect risk including dependence, potential withdrawal symptoms, and the potential eventual dose-related risk of dementia.  But recent studies from 2020 dispute this association between benzodiazepines and dementia risk. Newer studies in 2020 do not support an association with dementia.  Also discussed concerns about the Benadryl that she is taking at night for sleep which may adversely affect her memory as well.  Disc cognitive risks with OTC sleep aids.  Keep them at a minimum if possible.     Answered questions about thyroid med and weight.  Is a little hyperthyroid and doc adjusted Synthroid.121.6#  usual 130#  Sleep better with mirtazapine 15 mg HS.  Lithium 150 mg 1 in the morning and 2 at night Lorazepam successful at DC of  0.5 mg nightly  Labs stable. Repeat lithium level every 6 mos bc is on low dose with low level.   Extensive discussion of various labs.  Followed closely by internal med and endo  No med changes:  lithium and mirtazapine 15 mg HS only psych meds now.  FU 3-4 mos  Lynder Parents, MD, DFAPA   Please see After Visit Summary for patient specific instructions.  No future appointments.   No orders of the defined types were placed in this encounter.     -------------------------------

## 2020-12-17 ENCOUNTER — Other Ambulatory Visit: Payer: Self-pay

## 2020-12-17 ENCOUNTER — Telehealth: Payer: Self-pay | Admitting: Psychiatry

## 2020-12-17 DIAGNOSIS — F411 Generalized anxiety disorder: Secondary | ICD-10-CM

## 2020-12-17 DIAGNOSIS — F5105 Insomnia due to other mental disorder: Secondary | ICD-10-CM

## 2020-12-17 MED ORDER — MIRTAZAPINE 15 MG PO TABS: 15.0000 mg | ORAL_TABLET | Freq: Every day | ORAL | 0 refills | Status: DC

## 2020-12-17 NOTE — Telephone Encounter (Signed)
Rx sent 

## 2020-12-17 NOTE — Telephone Encounter (Signed)
Pt called and said that she needs a refill on her mirtazapine to be sent to [facility] located at [address]. Next appt in march

## 2021-01-12 ENCOUNTER — Telehealth: Payer: Self-pay | Admitting: Psychiatry

## 2021-01-12 ENCOUNTER — Other Ambulatory Visit: Payer: Self-pay

## 2021-01-12 DIAGNOSIS — F5105 Insomnia due to other mental disorder: Secondary | ICD-10-CM

## 2021-01-12 DIAGNOSIS — F411 Generalized anxiety disorder: Secondary | ICD-10-CM

## 2021-01-12 DIAGNOSIS — F3181 Bipolar II disorder: Secondary | ICD-10-CM

## 2021-01-12 DIAGNOSIS — Z79899 Other long term (current) drug therapy: Secondary | ICD-10-CM

## 2021-01-12 MED ORDER — MIRTAZAPINE 15 MG PO TABS: 15.0000 mg | ORAL_TABLET | Freq: Every day | ORAL | 0 refills | Status: DC

## 2021-01-12 MED ORDER — LITHIUM CARBONATE 150 MG PO CAPS: ORAL_CAPSULE | ORAL | 0 refills | Status: DC

## 2021-01-12 MED ORDER — LEVOTHYROXINE SODIUM 88 MCG PO TABS: 88.0000 ug | ORAL_TABLET | Freq: Every day | ORAL | 2 refills | Status: DC

## 2021-01-12 NOTE — Telephone Encounter (Signed)
Rx sent 

## 2021-01-12 NOTE — Telephone Encounter (Signed)
Patient lvm stating that her insurance has changed her pharmacy. Rtc to patient for more information. She would like all prescrtions now sent to [facility]. Lithium Carbonate 150mg , Mirtazapine 15mg  and Synthroid 88mg . Ph: [phone]. Appt 3/8

## 2021-01-13 DIAGNOSIS — R1110 Vomiting, unspecified: Secondary | ICD-10-CM | POA: Diagnosis not present

## 2021-01-13 DIAGNOSIS — J3489 Other specified disorders of nose and nasal sinuses: Secondary | ICD-10-CM | POA: Diagnosis not present

## 2021-01-13 DIAGNOSIS — B349 Viral infection, unspecified: Secondary | ICD-10-CM | POA: Diagnosis not present

## 2021-01-13 DIAGNOSIS — Z1152 Encounter for screening for COVID-19: Secondary | ICD-10-CM | POA: Diagnosis not present

## 2021-01-13 DIAGNOSIS — R059 Cough, unspecified: Secondary | ICD-10-CM | POA: Diagnosis not present

## 2021-01-28 DIAGNOSIS — L570 Actinic keratosis: Secondary | ICD-10-CM | POA: Diagnosis not present

## 2021-01-28 DIAGNOSIS — Z23 Encounter for immunization: Secondary | ICD-10-CM | POA: Diagnosis not present

## 2021-02-17 DIAGNOSIS — E785 Hyperlipidemia, unspecified: Secondary | ICD-10-CM | POA: Diagnosis not present

## 2021-02-17 DIAGNOSIS — E559 Vitamin D deficiency, unspecified: Secondary | ICD-10-CM | POA: Diagnosis not present

## 2021-02-17 DIAGNOSIS — E039 Hypothyroidism, unspecified: Secondary | ICD-10-CM | POA: Diagnosis not present

## 2021-02-17 DIAGNOSIS — I10 Essential (primary) hypertension: Secondary | ICD-10-CM | POA: Diagnosis not present

## 2021-02-24 DIAGNOSIS — Z1331 Encounter for screening for depression: Secondary | ICD-10-CM | POA: Diagnosis not present

## 2021-02-24 DIAGNOSIS — E559 Vitamin D deficiency, unspecified: Secondary | ICD-10-CM | POA: Diagnosis not present

## 2021-02-24 DIAGNOSIS — M810 Age-related osteoporosis without current pathological fracture: Secondary | ICD-10-CM | POA: Diagnosis not present

## 2021-02-24 DIAGNOSIS — Z1339 Encounter for screening examination for other mental health and behavioral disorders: Secondary | ICD-10-CM | POA: Diagnosis not present

## 2021-02-24 DIAGNOSIS — K219 Gastro-esophageal reflux disease without esophagitis: Secondary | ICD-10-CM | POA: Diagnosis not present

## 2021-02-24 DIAGNOSIS — Z0000 Encounter for general adult medical examination without abnormal findings: Secondary | ICD-10-CM | POA: Diagnosis not present

## 2021-02-24 DIAGNOSIS — F319 Bipolar disorder, unspecified: Secondary | ICD-10-CM | POA: Diagnosis not present

## 2021-02-24 DIAGNOSIS — Z1212 Encounter for screening for malignant neoplasm of rectum: Secondary | ICD-10-CM | POA: Diagnosis not present

## 2021-02-24 DIAGNOSIS — E785 Hyperlipidemia, unspecified: Secondary | ICD-10-CM | POA: Diagnosis not present

## 2021-02-24 DIAGNOSIS — R82998 Other abnormal findings in urine: Secondary | ICD-10-CM | POA: Diagnosis not present

## 2021-02-24 DIAGNOSIS — E039 Hypothyroidism, unspecified: Secondary | ICD-10-CM | POA: Diagnosis not present

## 2021-03-03 DIAGNOSIS — L821 Other seborrheic keratosis: Secondary | ICD-10-CM | POA: Diagnosis not present

## 2021-03-03 DIAGNOSIS — L309 Dermatitis, unspecified: Secondary | ICD-10-CM | POA: Diagnosis not present

## 2021-03-08 DIAGNOSIS — E210 Primary hyperparathyroidism: Secondary | ICD-10-CM | POA: Diagnosis not present

## 2021-03-08 DIAGNOSIS — E890 Postprocedural hypothyroidism: Secondary | ICD-10-CM | POA: Diagnosis not present

## 2021-03-08 DIAGNOSIS — M810 Age-related osteoporosis without current pathological fracture: Secondary | ICD-10-CM | POA: Diagnosis not present

## 2021-03-17 ENCOUNTER — Encounter: Payer: Self-pay | Admitting: Psychiatry

## 2021-03-17 ENCOUNTER — Other Ambulatory Visit: Payer: Self-pay

## 2021-03-17 ENCOUNTER — Ambulatory Visit (INDEPENDENT_AMBULATORY_CARE_PROVIDER_SITE_OTHER): Payer: Medicare Other | Admitting: Psychiatry

## 2021-03-17 DIAGNOSIS — Z79899 Other long term (current) drug therapy: Secondary | ICD-10-CM

## 2021-03-17 DIAGNOSIS — F411 Generalized anxiety disorder: Secondary | ICD-10-CM | POA: Diagnosis not present

## 2021-03-17 DIAGNOSIS — F5105 Insomnia due to other mental disorder: Secondary | ICD-10-CM | POA: Diagnosis not present

## 2021-03-17 DIAGNOSIS — F3181 Bipolar II disorder: Secondary | ICD-10-CM

## 2021-03-17 MED ORDER — LITHIUM CARBONATE 150 MG PO CAPS: ORAL_CAPSULE | ORAL | 1 refills | Status: DC

## 2021-03-17 MED ORDER — MIRTAZAPINE 15 MG PO TABS: 15.0000 mg | ORAL_TABLET | Freq: Every day | ORAL | 1 refills | Status: DC

## 2021-03-17 NOTE — Progress Notes (Signed)
Amy [name] [id number] January 16, 1945 76 y.o.   Subjective:   [name] is a 76 y.o. (DOB [birth year]) female.  Chief Complaint:  Chief Complaint  Patient presents with   Follow-up    Bipolar II disorder (Amy [name])    Anxiety Symptoms include decreased concentration. Patient reports no confusion, dizziness, nausea, nervous/anxious behavior, palpitations or suicidal ideas.    Amy [name] presents to the office today for follow-up of mood, anxiety and STM problems.    seen November 2020.  The following was noted: She moved to this appointment earlier. Amy [name] in on the appt Amy [name]. Not feeling well for over a week.  Staying with Amy [name] for a week.   Problems with diarrhea.  Using immodium. More tremor and confusion, poor memory, and poor handwriting.  Amy [name] notes sleeping a lot and is cold.   Has stayed well re: viral illness.  Has been able to still meet with 8 neighbors and that has continued to help her mental health.   Notices more trouble with word-finding and fluency and that bothers her.   Things better lately with Amy [name], this has been the chronic stressor for years.  Doesn't know why but pleased.  Disc other stressors with a recent friend when she tried to reconcile with him and he refused. No meds were changed.  05/10/2019 appointment, the following is noted: Disc lithium level variations from November 0.7 to April 0.4 with one in between of 1.7 at Amy [name] office without any changes in dosages.  No explanation for that change. Had been dealing with diarrhea but Amy [name] changed med added Florastor which helped.  Worries about the cat not sleeping with her.   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  Struggling to learn a new phone.  Can do Facebook. No med change.  08/05/19 appt with the following noted: Up and down.  Recent period of confusion.  Had a problem with one of neighbors. Asked for daughter's help and she came by to help. No problems driving.   Word finding issues. Does her own shopping and pays bills. She went for follow up to labs July 6 and did not have UTI. Not anxious since being more social.  Not as easily upset. On same doses of lorazepam 0.5 mg HS, mirtazapine 7.5 mg HS, lithium 150 mg 1 cap AM and 2 HS. Some awakening hungry at night lately. Plan: no med changes  11/27/19 appt with following noted: Problems with GI vomiting diarrhea, skin problems with med changes.  Skin problem is better some with topicals.  N and diarrhea might be stress related bc is better now that keeps her distance from stressful neighbor.   They wondered about her stopping all meds to reevaluate. On same doses of lorazepam 0.5 mg HS, mirtazapine 7.5 mg HS, lithium 150 mg 1 cap AM and 2 HS. Sleep is good with meds.    Plan no med changes but check labs  03/11/2020 appointment with the following noted: Labs were checked and noted.  FU endocrinology tomorrow  No SE with meds. Still walking with other women. Covid and weather limit mood.  Scrapbooking helps.  GS visits and helps her with things. Needs colonoscopy.  Hip pain. Less confusion.  But can be a little forgetful.  Still can manage her own activities. Doesn't like to cook.  Likes her apt.  Plan: no med changes  06/10/20 appt with following noted: Neighbor group has split up.  New resident came in and split  things up.  Colonoscopy pending for August bc concerns  Raised at [facility]. Sleep not as good.  Problems with neighbors bothers her sleep.  Delayed sleep phase  Went to [place] No SE with meds Plan: Disc alternative sleeper.  First try increase mirtazapine to 15 mg HS to see if sleep better. Disc alternative sleeper.  First try increase mirtazapine to 15 mg HS to see if sleep better.   09/15/2020 appointment with the following noted: Patient has had some confusion around how to take the mirtazapine.  At times she has had 15 mg tablets and other x7.5 mg tablets and apparently has taken up to 30  mg. Been behaving and staying out of trouble.  Saw family last weekend and had a good time. Has been taking mirtazapine 15 as 2 of the 7.5 mg tablets and sleeping well.   Lost medical doctors, Amy [name]. Plan: Check lithium level No med changes  12/16/2020 appointment with the following noted: Disc medical visits. Less socialization DT weather.  But is walking with 2 other ladies. Hopes for frequent visits with family.  Not as regular visits as she would like but did see them at Thanksgiving.  Lonely right now. Ongoing GI problems and plans to see a doctor Plan: No med changes Continue lithium 150 mg every morning and 300 mg nightly, mirtazapine 15 mg nightly. Not on a benzodiazepine at this time  03/17/2021 appointment with the following noted: Personally doing very well.  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 Was keeping them but now it's intermittent.  Health is OK lately. Not walking as much DT weather. Naps some.   Once or twice a month gets sx of hyperventilation and breathes into a brown bag until it gets better.  Thinks it's the [name].  Occ will wake her up. Doesn't cook much.  But buys prepared foods at grocery store. Is gathering outside sometimes with 6-10 ladies.  Patient reports stable mood and denies depressed or irritable moods.  Patient has some chronic difficulty with anxiety situationally but not worse.  Patient denies difficulty with sleep initiation or maintenance usually.  Takes OTC sleep aid and needs it to sleep.  No hangover. Denies appetite disturbance.  Patient reports that energy and motivation have been good.  Patient worsening difficulty with concentration and memory.  Gets times confused.  No missed appts..  Patient denies any suicidal ideation.  Saw Amy [name] in January and the labs and he had no concerns about her labs.  Normal EKG  Past Psychiatric Medication Trials: Lamotrigine, Depakote, lithium, olanzapine 2.5 Buspar dizzy,   Benadryl HS helps sleep, mirtazapine 15, Trazodone,  Wellbutrin ,  NAC cut out bc NR noted and doesn't like so many pills.     Review of Systems:  Review of Systems  HENT:  Positive for voice change.   Cardiovascular:  Negative for palpitations.  Gastrointestinal:  Positive for diarrhea. Negative for nausea.  Musculoskeletal:  Positive for arthralgias and back pain.       Sees chiropracter  Skin:  Positive for rash.  Neurological:  Negative for dizziness, tremors, weakness and headaches.       Balance isn't great. No recent falls.  Psychiatric/Behavioral:  Positive for decreased concentration. Negative for agitation, behavioral problems, confusion, dysphoric mood, hallucinations, self-injury, sleep disturbance and suicidal ideas. The patient is not nervous/anxious and is not hyperactive.    Medications: I have reviewed the patient's current medications.  Current Outpatient Medications  Medication Sig Dispense  Refill   Calcium Citrate (CITRACAL PO) Take by mouth 2 (two) times daily.      Cholecalciferol 25 MCG (1000 UT) capsule Take 1 tablet by mouth every other day.     estradiol (ESTRACE) 0.1 MG/GM vaginal cream Use 1/2 g vaginally two or three times per week as needed to maintain symptom relief. 42.5 g 1   levothyroxine (SYNTHROID) 88 MCG tablet Take 1 tablet (88 mcg total) by mouth daily before breakfast. 30 tablet 2   lidocaine (XYLOCAINE) 2 % jelly USE AS NEEDED FOR PAINFUL HEMORROIDS THREE TIMES DAILY 30 mL 1   Melatonin 10 MG CAPS Take 1 tablet by mouth at bedtime.     omeprazole (PRILOSEC) 40 MG capsule Take 40 mg by mouth daily.  2   psyllium (METAMUCIL) 58.6 % packet Take by mouth.     QC PINK BISMUTH 262 MG chewable tablet      rosuvastatin (CRESTOR) 20 MG tablet Take 20 mg by mouth daily at 12 noon.     lithium carbonate 150 MG capsule Take one capsule by mouth every morning and two capsules at bedtime 270 capsule 1   loratadine (CLARITIN) 10 MG tablet Take 10 mg by mouth  daily. (Patient not taking: Reported on 12/16/2020)     mirtazapine (REMERON) 15 MG tablet Take 1 tablet (15 mg total) by mouth at bedtime. 90 tablet 1   No current facility-administered medications for this visit.    Medication Side Effects: None  Allergies:  Allergies  Allergen Reactions   Other Other (See Comments)    Allergic rhinitis symptoms     Past Medical History:  Diagnosis Date   Anxiety    Bipolar 1 disorder (HCC)    Breast mass, left    Cancer (HCC)    hx of skin cancer   Confusion    Depression    Eczema 2021   Fibroid    GERD (gastroesophageal reflux disease)    History of colon polyps    Hyperparathyroidism    Hypothyroidism    Leg lesion    Osteopenia    Restless leg    Thyroid disease    UTI (lower urinary tract infection)     Family History  Problem Relation Age of Onset   Alzheimer's disease Mother    Stroke Mother    Hypertension Mother    Diabetes Father    Alzheimer's disease Father    Cancer Father        colon   Breast cancer Sister     Social History   Socioeconomic History   Marital status: Widowed    Spouse name: Not on file   Number of children: Not on file   Years of education: Not on file   Highest education level: Not on file  Occupational History   Not on file  Tobacco Use   Smoking status: Never   Smokeless tobacco: Never  Vaping Use   Vaping Use: Never used  Substance and Sexual Activity   Alcohol use: Yes    Alcohol/week: 1.0 standard drink    Types: 1 Glasses of wine per week   Drug use: No   Sexual activity: Not Currently    Partners: Male    Birth control/protection: Post-menopausal  Other Topics Concern   Not on file  Social History Narrative   Not on file   Social Determinants of Health   Financial Resource Strain: Not on file  Food Insecurity: Not on file  Transportation Needs: Not on  file  Physical Activity: Not on file  Stress: Not on file  Social Connections: Not on file  Intimate Partner  Violence: Not on file    Past Medical History, Surgical history, Social history, and Family history were reviewed and updated as appropriate.   Please see review of systems for further details on the patient's review from today.   Objective:   Physical Exam:  LMP 01/10/2005 (Approximate)   Physical Exam Constitutional:      General: She is not in acute distress. Musculoskeletal:        General: No deformity.  Neurological:     Mental Status: She is alert and oriented to person, place, and time.     Motor: No tremor.     Coordination: Coordination normal.     Gait: Gait normal.  Psychiatric:        Attention and Perception: Attention and perception normal. She is attentive. She does not perceive auditory or visual hallucinations.        Mood and Affect: Mood is not anxious or depressed. Affect is not labile or angry.        Speech: Speech normal. Speech is not rapid and pressured.        Behavior: Behavior normal. Behavior is not slowed.        Thought Content: Thought content normal. Thought content is not paranoid or delusional. Thought content does not include homicidal or suicidal ideation.        Cognition and Memory: Memory normal. Cognition is impaired.        Judgment: Judgment normal.     Comments: Neat. Insight and judgment fair. Mild forgetfulness and cognitive problems ongoing but stable and not worse over 2022-2023     Lab Review:     Component Value Date/Time   NA 142 07/16/2019 1453   K 3.9 07/16/2019 1453   CL 106 07/16/2019 1453   CO2 30 07/16/2019 1453   GLUCOSE 116 07/16/2019 1453   BUN 11 07/16/2019 1453   CREATININE 0.67 07/16/2019 1453   CALCIUM 10.9 (H) 07/16/2019 1453   GFRNONAA >90 09/11/2012 1330   GFRAA >90 09/11/2012 1330       Component Value Date/Time   WBC 8.3 09/11/2012 1330   RBC 4.45 09/11/2012 1330   HGB 13.7 09/11/2012 1330   HCT 42.4 09/11/2012 1330   PLT 186 09/11/2012 1330   MCV 95.3 09/11/2012 1330   MCH 30.8 09/11/2012  1330   MCHC 32.3 09/11/2012 1330   RDW 13.4 09/11/2012 1330   LYMPHSABS 1.1 09/11/2012 1330   MONOABS 0.6 09/11/2012 1330   EOSABS 0.1 09/11/2012 1330   BASOSABS 0.0 09/11/2012 1330    Lithium Lvl  Date Value Ref Range Status  07/16/2019 0.5 (L) 0.6 - 1.2 mmol/L Final    Component 12/15/20 11/02/20 06/30/20 03/09/20 06/12/19 11/17/16  Lithium Level 0.5 Low   0.5 Low   0.6  0.6 0.6 0.6   Component 12/15/20 11/02/20 06/30/20 03/09/20 10/14/19 06/12/19  TSH 0.27 Low   0.06 Low   0.95  2.550  0.608  2.794    11/02/2020 calcium 10.8, creatinine 0.62, vitamin Amy [name] 97 on 1000 U daily at [facility]  No results found for: PHENYTOIN, PHENOBARB, VALPROATE, CBMZ   Bone density test osteoporosis  Hips and spine.    .res Assessment: Plan:    Bipolar II disorder ([place]) - Plan: lithium carbonate 150 MG capsule  Generalized anxiety disorder - Plan: mirtazapine (REMERON) 15 MG tablet  Insomnia  due to mental condition - Plan: mirtazapine (REMERON) 15 MG tablet  Lithium use - Plan: lithium carbonate 150 MG capsule    Greater than 50% of  30 min face to face time with patient was spent on counseling and coordination of care. Counseled patient regarding potential benefits, risks, and side effects of lithium to include potential risk of lithium affecting thyroid and renal functon.  Discussed need for periodic lab monitoring to determine drug level and to assess for potential adverse effects.  Counseled patient regarding signs and symptoms of lithium toxicity and advised that they notify office immediately or seek urgent medical attention if experiencing these signs and symptoms.  Patient advised to contact office with any questions or concerns.  She has a history of hyperparathyroidism and is aware that lithium can increase calcium levels.  At her age it would be very risky to attempt to find an alternative mood stabilizer when lithium has been effective.  Alternatives would expose her  to different types of side effects such as those with the atypical antipsychotics including tardive dyskinesia.  Borderline mild cognitive impairment but she is able to care for herself adequately. Episodically worse without known triggers.  But appears stable with no worsening over this 2022. May be better off BZ   She feels her mood and anxiety and sleep are pretty stable except as noted.  She has been on lithium for many years with good response.  Specifically stopping lithium which has helped her for decades would be highly risky for significant mood destabilization which could last for months and expose her to risks of atypical antipsychotics which would be the alternative to lithium.  She's already failed alternative of Depakote. She doesn't want to change the lithium.  Overall doing well despite very low lithium levels.  Med sensitve  Insomnia is managed with medications at [facility].  Some possible limited sx panic with episodes of SOB and hyperventilation and brreathing in brown bag.   Also discussed concerns about the Benadryl that she is taking at night for sleep which may adversely affect her memory as well.  Disc cognitive risks with OTC sleep aids.  Keep them at a minimum if possible.     Sleep better with mirtazapine 15 mg HS.   No med changes Continue lithium 150 mg every morning and 300 mg nightly, mirtazapine 15 mg nightly. Not on a benzodiazepine at this time Labs stable 02/17/21 including normal BMP  Repeat lithium level every 6 mos bc is on low dose with low level.   Extensive discussion of various labs.  Followed closely by internal med and endo  FU 3-4 mos  Lynder Parents, MD, DFAPA   Please see After Visit Summary for patient specific instructions.  No future appointments.    No orders of the defined types were placed in this encounter.      -------------------------------

## 2021-04-13 ENCOUNTER — Encounter: Payer: Self-pay | Admitting: Internal Medicine

## 2021-04-20 DIAGNOSIS — R748 Abnormal levels of other serum enzymes: Secondary | ICD-10-CM | POA: Diagnosis not present

## 2021-04-20 DIAGNOSIS — Z79890 Hormone replacement therapy (postmenopausal): Secondary | ICD-10-CM | POA: Diagnosis not present

## 2021-04-20 DIAGNOSIS — F319 Bipolar disorder, unspecified: Secondary | ICD-10-CM | POA: Diagnosis not present

## 2021-04-20 DIAGNOSIS — E210 Primary hyperparathyroidism: Secondary | ICD-10-CM | POA: Diagnosis not present

## 2021-04-20 DIAGNOSIS — E890 Postprocedural hypothyroidism: Secondary | ICD-10-CM | POA: Diagnosis not present

## 2021-04-20 DIAGNOSIS — M810 Age-related osteoporosis without current pathological fracture: Secondary | ICD-10-CM | POA: Diagnosis not present

## 2021-04-22 ENCOUNTER — Other Ambulatory Visit: Payer: Self-pay | Admitting: Psychiatry

## 2021-05-01 DIAGNOSIS — Z20822 Contact with and (suspected) exposure to covid-19: Secondary | ICD-10-CM | POA: Diagnosis not present

## 2021-05-11 DIAGNOSIS — Z20822 Contact with and (suspected) exposure to covid-19: Secondary | ICD-10-CM | POA: Diagnosis not present

## 2021-06-14 ENCOUNTER — Encounter: Payer: Self-pay | Admitting: Psychiatry

## 2021-06-14 ENCOUNTER — Ambulatory Visit (INDEPENDENT_AMBULATORY_CARE_PROVIDER_SITE_OTHER): Payer: Medicare Other | Admitting: Psychiatry

## 2021-06-14 VITALS — BP 131/79 | HR 96

## 2021-06-14 DIAGNOSIS — F5105 Insomnia due to other mental disorder: Secondary | ICD-10-CM

## 2021-06-14 DIAGNOSIS — F411 Generalized anxiety disorder: Secondary | ICD-10-CM | POA: Diagnosis not present

## 2021-06-14 DIAGNOSIS — F3181 Bipolar II disorder: Secondary | ICD-10-CM | POA: Diagnosis not present

## 2021-06-14 DIAGNOSIS — Z79899 Other long term (current) drug therapy: Secondary | ICD-10-CM

## 2021-06-14 NOTE — Progress Notes (Signed)
Amy [name] [id number] [birth year] 76 y.o.   Subjective:   [name] is a 76 y.o. (DOB 30-Mar-1945) female.  Chief Complaint:  Chief Complaint  Patient presents with   Follow-up    Bipolar II disorder ([place])   Stress   Anxiety    Anxiety Symptoms include decreased concentration. Patient reports no confusion, dizziness, nausea, nervous/anxious behavior, palpitations or suicidal ideas.    Amy [name] presents to the office today for follow-up of mood, anxiety and STM problems.    seen November 2020.  The following was noted: She moved to this appointment earlier. D in on the appt [name]. Not feeling well for over a week.  Staying with [name] for a week.   Problems with diarrhea.  Using immodium. More tremor and confusion, poor memory, and poor handwriting.  D notes sleeping a lot and is cold.   Has stayed well re: viral illness.  Has been able to still meet with 8 neighbors and that has continued to help her mental health.   Notices more trouble with word-finding and fluency and that bothers her.   Things better lately with D, this has been the chronic stressor for years.  Doesn't know why but pleased.  Disc other stressors with a recent friend when she tried to reconcile with him and he refused. No meds were changed.  05/10/2019 appointment, the following is noted: Disc lithium level variations from November 0.7 to April 0.4 with one in between of 1.7 at [name] office without any changes in dosages.  No explanation for that change. Had been dealing with diarrhea but [name] changed med added Florastor which helped.  Worries about the cat not sleeping with her.   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  Struggling to learn a new phone.  Can do Facebook. No med change.  08/05/19 appt with the following noted: Up and down.  Recent period of confusion.  Had a problem with one of neighbors. Asked for daughter's help and she came by to help.  No problems driving.  Word finding issues. Does her own shopping and pays bills. She went for follow up to labs July 6 and did not have UTI. Not anxious since being more social.  Not as easily upset. On same doses of lorazepam 0.5 mg HS, mirtazapine 7.5 mg HS, lithium 150 mg 1 cap AM and 2 HS. Some awakening hungry at night lately. Plan: no med changes  11/27/19 appt with following noted: Problems with GI vomiting diarrhea, skin problems with med changes.  Skin problem is better some with topicals.  N and diarrhea might be stress related bc is better now that keeps her distance from stressful neighbor.   They wondered about her stopping all meds to reevaluate. On same doses of lorazepam 0.5 mg HS, mirtazapine 7.5 mg HS, lithium 150 mg 1 cap AM and 2 HS. Sleep is good with meds.    Plan no med changes but check labs  03/11/2020 appointment with the following noted: Labs were checked and noted.  FU endocrinology tomorrow  No SE with meds. Still walking with other women. Covid and weather limit mood.  Scrapbooking helps.  GS visits and helps her with things. Needs colonoscopy.  Hip pain. Less confusion.  But can be a little forgetful.  Still can manage her own activities. Doesn't like to cook.  Likes her apt.  Plan: no med changes  06/10/20 appt with following noted: Neighbor group has split up.  New resident came in and split things up.  Colonoscopy pending for August bc concerns  Raised at [facility]. Sleep not as good.  Problems with neighbors bothers her sleep.  Delayed sleep phase  Went to [place] No SE with meds Plan: Disc alternative sleeper.  First try increase mirtazapine to 15 mg HS to see if sleep better. Disc alternative sleeper.  First try increase mirtazapine to 15 mg HS to see if sleep better.   09/15/2020 appointment with the following noted: Patient has had some confusion around how to take the mirtazapine.  At times she has had 15 mg tablets and other x7.5 mg tablets and apparently  has taken up to 30 mg. Been behaving and staying out of trouble.  Saw family last weekend and had a good time. Has been taking mirtazapine 15 as 2 of the 7.5 mg tablets and sleeping well.   Lost medical doctors, [name] and [name]. Plan: Check lithium level No med changes  12/16/2020 appointment with the following noted: Disc medical visits. Less socialization DT weather.  But is walking with 2 other ladies. Hopes for frequent visits with family.  Not as regular visits as she would like but did see them at Thanksgiving.  Lonely right now. Ongoing GI problems and plans to see a doctor Plan: No med changes Continue lithium 150 mg every morning and 300 mg nightly, mirtazapine 15 mg nightly. Not on a benzodiazepine at this time  03/17/2021 appointment with the following noted: Personally doing very well.  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 Was keeping them but now it's intermittent.  Health is OK lately. Not walking as much DT weather. Naps some.   Once or twice a month gets sx of hyperventilation and breathes into a brown bag until it gets better.  Thinks it's the [name].  Occ will wake her up. Doesn't cook much.  But buys prepared foods at grocery store. Is gathering outside sometimes with 6-10 ladies. Patient reports stable mood and denies depressed or irritable moods.  Patient has some chronic difficulty with anxiety situationally but not worse.  Patient denies difficulty with sleep initiation or maintenance usually.  Takes OTC sleep aid and needs it to sleep.  No hangover. Denies appetite disturbance.  Patient reports that energy and motivation have been good.  Patient worsening difficulty with concentration and memory.  Gets times confused.  No missed appts..  Patient denies any suicidal ideation. Saw [name] in January and the labs and he had no concerns about her labs.  Normal EKG  06/14/21 appt noted: Weight stable 129#. Argument with lady at circle on Friday.   Upsetting.  Pt says she will not go to the circle for awhile.  Has another women's social group.  Group is gossiping. 76 yo D having some mental problems.  Disc this. Sleep well. And some napping. Not sig depressed.  Tolerating meds. [name] is aggravating. [name] retired.   Past Psychiatric Medication Trials: Lamotrigine, Depakote, lithium, olanzapine 2.5 Buspar dizzy,  Benadryl HS helps sleep, mirtazapine 15, Trazodone,  Wellbutrin ,  NAC cut out bc NR noted and doesn't like so many pills.     Review of Systems:  Review of Systems  HENT:  Positive for dental problem and voice change.   Cardiovascular:  Negative for palpitations.  Gastrointestinal:  Positive for diarrhea. Negative for nausea.  Musculoskeletal:  Positive for arthralgias and back pain.       Sees chiropracter  Skin:  Positive for rash.  Neurological:  Negative for dizziness, tremors, weakness and headaches.       Balance isn't great. No recent falls.  Psychiatric/Behavioral:  Positive for decreased concentration. Negative for agitation, behavioral problems, confusion, dysphoric mood, hallucinations, self-injury, sleep disturbance and suicidal ideas. The patient is not nervous/anxious and is not hyperactive.    Medications: I have reviewed the patient's current medications.  Current Outpatient Medications  Medication Sig Dispense Refill   Cholecalciferol 25 MCG (1000 UT) capsule Take 1 tablet by mouth daily.     estradiol (ESTRACE) 0.1 MG/GM vaginal cream Use 1/2 g vaginally two or three times per week as needed to maintain symptom relief. 42.5 g 1   levothyroxine (SYNTHROID) 88 MCG tablet TAKE 1 TABLET BY MOUTH ONCE DAILY BEFORE BREAKFAST 30 tablet 1   lidocaine (XYLOCAINE) 2 % jelly USE AS NEEDED FOR PAINFUL HEMORROIDS THREE TIMES DAILY 30 mL 1   lithium carbonate 150 MG capsule Take one capsule by mouth every morning and two capsules at bedtime 270 capsule 1   loratadine (CLARITIN) 10 MG tablet Take 10 mg  by mouth daily.     Melatonin 10 MG CAPS Take 1 tablet by mouth at bedtime.     mirtazapine (REMERON) 15 MG tablet Take 1 tablet (15 mg total) by mouth at bedtime. 90 tablet 1   omeprazole (PRILOSEC) 40 MG capsule Take 40 mg by mouth daily.  2   psyllium (METAMUCIL) 58.6 % packet Take by mouth.     QC PINK BISMUTH 262 MG chewable tablet      rosuvastatin (CRESTOR) 20 MG tablet Take 20 mg by mouth daily at 12 noon.     Calcium Citrate (CITRACAL PO) Take by mouth 2 (two) times daily.  (Patient not taking: Reported on 06/14/2021)     No current facility-administered medications for this visit.    Medication Side Effects: None  Allergies:  Allergies  Allergen Reactions   Other Other (See Comments)    Allergic rhinitis symptoms     Past Medical History:  Diagnosis Date   Anxiety    Bipolar 1 disorder ([facility])    Breast mass, left    Cancer ([facility])    hx of skin cancer   Confusion    Depression    Eczema 2021   Fibroid    GERD (gastroesophageal reflux disease)    History of colon polyps    Hyperparathyroidism    Hypothyroidism    Leg lesion    Osteopenia    Restless leg    Thyroid disease    UTI (lower urinary tract infection)     Family History  Problem Relation Age of Onset   Alzheimer's disease Mother    Stroke Mother    Hypertension Mother    Diabetes Father    Alzheimer's disease Father    Cancer Father        colon   Breast cancer Sister     Social History   Socioeconomic History   Marital status: Widowed    Spouse name: Not on file   Number of children: Not on file   Years of education: Not on file   Highest education level: Not on file  Occupational History   Not on file  Tobacco Use   Smoking status: Never   Smokeless tobacco: Never  Vaping Use   Vaping Use: Never used  Substance and Sexual Activity   Alcohol use: Yes    Alcohol/week: 1.0 standard drink    Types: 1 Glasses of wine  per week   Drug use: No   Sexual activity: Not Currently     Partners: Male    Birth control/protection: Post-menopausal  Other Topics Concern   Not on file  Social History Narrative   Not on file   Social Determinants of Health   Financial Resource Strain: Not on file  Food Insecurity: Not on file  Transportation Needs: Not on file  Physical Activity: Not on file  Stress: Not on file  Social Connections: Not on file  Intimate Partner Violence: Not on file    Past Medical History, Surgical history, Social history, and Family history were reviewed and updated as appropriate.   Please see review of systems for further details on the patient's review from today.   Objective:   Physical Exam:  BP 131/79   Pulse 96   LMP 01/10/2005 (Approximate)   Physical Exam Constitutional:      General: She is not in acute distress. Musculoskeletal:        General: No deformity.  Neurological:     Mental Status: She is alert and oriented to person, place, and time.     Motor: No tremor.     Coordination: Coordination normal.     Gait: Gait normal.  Psychiatric:        Attention and Perception: Attention and perception normal. She is attentive. She does not perceive auditory or visual hallucinations.        Mood and Affect: Mood is not anxious or depressed. Affect is not labile or angry.        Speech: Speech normal. Speech is not rapid and pressured.        Behavior: Behavior normal. Behavior is not slowed.        Thought Content: Thought content normal. Thought content is not paranoid or delusional. Thought content does not include homicidal or suicidal ideation.        Cognition and Memory: Memory normal. Cognition is impaired.        Judgment: Judgment normal.     Comments: Neat. Insight and judgment fair. Mild forgetfulness and cognitive problems ongoing but stable and not worse over 2022-2023     Lab Review:     Component Value Date/Time   NA 142 07/16/2019 1453   K 3.9 07/16/2019 1453   CL 106 07/16/2019 1453   CO2 30 07/16/2019  1453   GLUCOSE 116 07/16/2019 1453   BUN 11 07/16/2019 1453   CREATININE 0.67 07/16/2019 1453   CALCIUM 10.9 (H) 07/16/2019 1453   GFRNONAA >90 09/11/2012 1330   GFRAA >90 09/11/2012 1330       Component Value Date/Time   WBC 8.3 09/11/2012 1330   RBC 4.45 09/11/2012 1330   HGB 13.7 09/11/2012 1330   HCT 42.4 09/11/2012 1330   PLT 186 09/11/2012 1330   MCV 95.3 09/11/2012 1330   MCH 30.8 09/11/2012 1330   MCHC 32.3 09/11/2012 1330   RDW 13.4 09/11/2012 1330   LYMPHSABS 1.1 09/11/2012 1330   MONOABS 0.6 09/11/2012 1330   EOSABS 0.1 09/11/2012 1330   BASOSABS 0.0 09/11/2012 1330    Lithium Lvl  Date Value Ref Range Status  07/16/2019 0.5 (L) 0.6 - 1.2 mmol/L Final    Component 12/15/20 11/02/20 06/30/20 03/09/20 06/12/19 11/17/16  Lithium Level 0.5 Low   0.5 Low   0.6  0.6 0.6 0.6   Component 12/15/20 11/02/20 06/30/20 03/09/20 10/14/19 06/12/19  TSH 0.27 Low   0.06 Low   0.95  2.550  0.608  2.794    11/02/2020 calcium 10.8, creatinine 0.62, vitamin D 97 on 1000 U daily at [place]  No results found for: PHENYTOIN, PHENOBARB, VALPROATE, CBMZ   Bone density test osteoporosis  Hips and spine.    .res Assessment: Plan:    Bipolar II disorder ([place])  Generalized anxiety disorder  Insomnia due to mental condition  Lithium use    Greater than 50% of  30 min face to face time with patient was spent on counseling and coordination of care. Counseled patient regarding potential benefits, risks, and side effects of lithium to include potential risk of lithium affecting thyroid and renal functon.  Discussed need for periodic lab monitoring to determine drug level and to assess for potential adverse effects.  Counseled patient regarding signs and symptoms of lithium toxicity and advised that they notify office immediately or seek urgent medical attention if experiencing these signs and symptoms.  Patient advised to contact office with any questions or  concerns.  She has a history of hyperparathyroidism and is aware that lithium can increase calcium levels.  At her age it would be very risky to attempt to find an alternative mood stabilizer when lithium has been effective.  Alternatives would expose her to different types of side effects such as those with the atypical antipsychotics including tardive dyskinesia.  Borderline mild cognitive impairment but she is able to care for herself adequately. Episodically worse without known triggers.  But appears stable with no worsening over this 2022. May be better off BZ   She feels her mood and anxiety and sleep are pretty stable except as noted.  She has been on lithium for many years with good response.  Specifically stopping lithium which has helped her for decades would be highly risky for significant mood destabilization which could last for months and expose her to risks of atypical antipsychotics which would be the alternative to lithium.  She's already failed alternative of Depakote. She doesn't want to change the lithium.  Overall doing well despite very low lithium levels.  Med sensitve  Insomnia is managed with medications at [facility].  Some possible limited sx panic with episodes of SOB and hyperventilation and brreathing in brown bag.  Disc cognitive risks with OTC sleep aids.  Keep them at a minimum if possible.     Sleep better with mirtazapine 15 mg HS.   No med changes Continue lithium 150 mg every morning and 300 mg nightly, mirtazapine 15 mg nightly. Not on a benzodiazepine at this time Labs stable 02/17/21 including normal BMP  Repeat lithium level every 6 mos bc is on low dose with low level.  As noted it is stable. Extensive discussion of various labs.  Followed closely by internal med and endo  FU 3-4 mos  Lynder Parents, MD, DFAPA   Please see After Visit Summary for patient specific instructions.  No future appointments.    No orders of the defined types were placed in  this encounter.      -------------------------------

## 2021-06-21 DIAGNOSIS — E890 Postprocedural hypothyroidism: Secondary | ICD-10-CM | POA: Diagnosis not present

## 2021-06-21 DIAGNOSIS — F319 Bipolar disorder, unspecified: Secondary | ICD-10-CM | POA: Diagnosis not present

## 2021-06-21 DIAGNOSIS — R748 Abnormal levels of other serum enzymes: Secondary | ICD-10-CM | POA: Diagnosis not present

## 2021-06-21 DIAGNOSIS — M810 Age-related osteoporosis without current pathological fracture: Secondary | ICD-10-CM | POA: Diagnosis not present

## 2021-06-21 DIAGNOSIS — E210 Primary hyperparathyroidism: Secondary | ICD-10-CM | POA: Diagnosis not present

## 2021-06-28 ENCOUNTER — Other Ambulatory Visit: Payer: Self-pay | Admitting: Psychiatry

## 2021-06-29 NOTE — Telephone Encounter (Signed)
Ok to send

## 2021-06-30 NOTE — Telephone Encounter (Signed)
Should contact PCP or endocrinologist

## 2021-07-01 DIAGNOSIS — Z1231 Encounter for screening mammogram for malignant neoplasm of breast: Secondary | ICD-10-CM | POA: Diagnosis not present

## 2021-07-08 DIAGNOSIS — R928 Other abnormal and inconclusive findings on diagnostic imaging of breast: Secondary | ICD-10-CM | POA: Diagnosis not present

## 2021-07-08 DIAGNOSIS — R922 Inconclusive mammogram: Secondary | ICD-10-CM | POA: Diagnosis not present

## 2021-07-09 ENCOUNTER — Encounter: Payer: Self-pay | Admitting: Obstetrics and Gynecology

## 2021-07-14 ENCOUNTER — Other Ambulatory Visit: Payer: Self-pay | Admitting: Radiology

## 2021-07-14 ENCOUNTER — Encounter: Payer: Self-pay | Admitting: Obstetrics and Gynecology

## 2021-07-14 DIAGNOSIS — N6315 Unspecified lump in the right breast, overlapping quadrants: Secondary | ICD-10-CM | POA: Diagnosis not present

## 2021-07-14 DIAGNOSIS — N6011 Diffuse cystic mastopathy of right breast: Secondary | ICD-10-CM | POA: Diagnosis not present

## 2021-07-21 ENCOUNTER — Encounter: Payer: Self-pay | Admitting: Internal Medicine

## 2021-07-28 ENCOUNTER — Encounter: Payer: Self-pay | Admitting: Obstetrics and Gynecology

## 2021-08-04 DIAGNOSIS — L309 Dermatitis, unspecified: Secondary | ICD-10-CM | POA: Diagnosis not present

## 2021-08-04 DIAGNOSIS — C44319 Basal cell carcinoma of skin of other parts of face: Secondary | ICD-10-CM | POA: Diagnosis not present

## 2021-08-04 DIAGNOSIS — D485 Neoplasm of uncertain behavior of skin: Secondary | ICD-10-CM | POA: Diagnosis not present

## 2021-08-17 DIAGNOSIS — C44319 Basal cell carcinoma of skin of other parts of face: Secondary | ICD-10-CM | POA: Diagnosis not present

## 2021-08-30 DIAGNOSIS — I10 Essential (primary) hypertension: Secondary | ICD-10-CM | POA: Diagnosis not present

## 2021-08-30 DIAGNOSIS — R0602 Shortness of breath: Secondary | ICD-10-CM | POA: Diagnosis not present

## 2021-08-30 DIAGNOSIS — R064 Hyperventilation: Secondary | ICD-10-CM | POA: Diagnosis not present

## 2021-08-30 DIAGNOSIS — N39498 Other specified urinary incontinence: Secondary | ICD-10-CM | POA: Diagnosis not present

## 2021-09-14 NOTE — Progress Notes (Signed)
Opened in error.  Appointment cancelled by patient after arrival to office.

## 2021-09-15 ENCOUNTER — Encounter: Payer: Medicare Other | Admitting: Obstetrics and Gynecology

## 2021-09-15 VITALS — Ht 65.0 in | Wt 128.0 lb

## 2021-09-16 ENCOUNTER — Encounter: Payer: Self-pay | Admitting: Psychiatry

## 2021-09-16 ENCOUNTER — Ambulatory Visit (INDEPENDENT_AMBULATORY_CARE_PROVIDER_SITE_OTHER): Payer: Medicare Other | Admitting: Psychiatry

## 2021-09-16 DIAGNOSIS — F411 Generalized anxiety disorder: Secondary | ICD-10-CM

## 2021-09-16 DIAGNOSIS — F5105 Insomnia due to other mental disorder: Secondary | ICD-10-CM | POA: Diagnosis not present

## 2021-09-16 DIAGNOSIS — Z79899 Other long term (current) drug therapy: Secondary | ICD-10-CM

## 2021-09-16 DIAGNOSIS — F3181 Bipolar II disorder: Secondary | ICD-10-CM | POA: Diagnosis not present

## 2021-09-16 MED ORDER — MIRTAZAPINE 15 MG PO TABS: 15.0000 mg | ORAL_TABLET | Freq: Every day | ORAL | 1 refills | Status: DC

## 2021-09-16 MED ORDER — LITHIUM CARBONATE 150 MG PO CAPS: ORAL_CAPSULE | ORAL | 1 refills | Status: DC

## 2021-09-16 NOTE — Progress Notes (Signed)
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   Subjective:   [name] is a 76 y.o. (DOB 09-Sep-1945) female.  Chief Complaint:  Chief Complaint  Patient presents with   Follow-up    Bipolar II disorder ([place])   Anxiety    Anxiety Symptoms include decreased concentration. Patient reports no confusion, dizziness, nausea, nervous/anxious behavior, palpitations or suicidal ideas.     Amy [name] presents to the office today for follow-up of mood, anxiety and STM problems.    seen November 2020.  The following was noted: She moved to this appointment earlier. D in on the appt Amy [name]. Not feeling well for over a week.  Staying with Amy [name] for a week.   Problems with diarrhea.  Using immodium. More tremor and confusion, poor memory, and poor handwriting.  D notes sleeping a lot and is cold.   Has stayed well re: viral illness.  Has been able to still meet with 8 neighbors and that has continued to help her mental health.   Notices more trouble with word-finding and fluency and that bothers her.   Things better lately with D, this has been the chronic stressor for years.  Doesn't know why but pleased.  Disc other stressors with a recent friend when she tried to reconcile with him and he refused. No meds were changed.  05/10/2019 appointment, the following is noted: Disc lithium level variations from November 0.7 to April 0.4 with one in between of 1.7 at Amy [name] office without any changes in dosages.  No explanation for that change. Had been dealing with diarrhea but Amy [name] changed med added Florastor which helped.  Worries about the cat not sleeping with her.   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  Struggling to learn a new phone.  Can do Facebook. No med change.  08/05/19 appt with the following noted: Up and down.  Recent period of confusion.  Had a problem with one of neighbors. Asked for daughter's help and she came by to help. No  problems driving.  Word finding issues. Does her own shopping and pays bills. She went for follow up to labs July 6 and did not have UTI. Not anxious since being more social.  Not as easily upset. On same doses of lorazepam 0.5 mg HS, mirtazapine 7.5 mg HS, lithium 150 mg 1 cap AM and 2 HS. Some awakening hungry at night lately. Plan: no med changes  11/27/19 appt with following noted: Problems with GI vomiting diarrhea, skin problems with med changes.  Skin problem is better some with topicals.  N and diarrhea might be stress related bc is better now that keeps her distance from stressful neighbor.   They wondered about her stopping all meds to reevaluate. On same doses of lorazepam 0.5 mg HS, mirtazapine 7.5 mg HS, lithium 150 mg 1 cap AM and 2 HS. Sleep is good with meds.    Plan no med changes but check labs  03/11/2020 appointment with the following noted: Labs were checked and noted.  FU endocrinology tomorrow  No SE with meds. Still walking with other women. Covid and weather limit mood.  Scrapbooking helps.  GS visits and helps her with things. Needs colonoscopy.  Hip pain. Less confusion.  But can be a little forgetful.  Still can manage her own activities. Doesn't like to cook.  Likes her apt.  Plan: no med changes  06/10/20 appt with following noted: Neighbor group has split up.  New resident  came in and split things up.  Colonoscopy pending for August bc concerns  Raised at [facility]. Sleep not as good.  Problems with neighbors bothers her sleep.  Delayed sleep phase  Went to [place] No SE with meds Plan: Disc alternative sleeper.  First try increase mirtazapine to 15 mg HS to see if sleep better. Disc alternative sleeper.  First try increase mirtazapine to 15 mg HS to see if sleep better.   09/15/2020 appointment with the following noted: Patient has had some confusion around how to take the mirtazapine.  At times she has had 15 mg tablets and other x7.5 mg tablets and apparently has  taken up to 30 mg. Been behaving and staying out of trouble.  Saw family last weekend and had a good time. Has been taking mirtazapine 15 as 2 of the 7.5 mg tablets and sleeping well.   Lost medical doctors, [name] and [name]. Plan: Check lithium level No med changes  12/16/2020 appointment with the following noted: Disc medical visits. Less socialization DT weather.  But is walking with 2 other ladies. Hopes for frequent visits with family.  Not as regular visits as she would like but did see them at Thanksgiving.  Lonely right now. Ongoing GI problems and plans to see a doctor Plan: No med changes Continue lithium 150 mg every morning and 300 mg nightly, mirtazapine 15 mg nightly. Not on a benzodiazepine at this time  03/17/2021 appointment with the following noted: Personally doing very well.  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 Was keeping them but now it's intermittent.  Health is OK lately. Not walking as much DT weather. Naps some.   Once or twice a month gets sx of hyperventilation and breathes into a brown bag until it gets better.  Thinks it's the [name].  Occ will wake her up. Doesn't cook much.  But buys prepared foods at grocery store. Is gathering outside sometimes with 6-10 ladies. Patient reports stable mood and denies depressed or irritable moods.  Patient has some chronic difficulty with anxiety situationally but not worse.  Patient denies difficulty with sleep initiation or maintenance usually.  Takes OTC sleep aid and needs it to sleep.  No hangover. Denies appetite disturbance.  Patient reports that energy and motivation have been good.  Patient worsening difficulty with concentration and memory.  Gets times confused.  No missed appts..  Patient denies any suicidal ideation. Saw [name] in January and the labs and he had no concerns about her labs.  Normal EKG  06/14/21 appt noted: Weight stable 129#. Argument with lady at circle on Friday.  Upsetting.   Pt says she will not go to the circle for awhile.  Has another women's social group.  Group is gossiping. 76 yo GD having some mental problems.  Disc this. Sleep well. And some napping. Not sig depressed.  Tolerating meds. [name] is aggravating. [name] retired.  09/16/21 appt noted: Behaving myself.   Friends having health problems worrisome.  Still friends at [employer].  There's fox bothering their cars. Digging up their area. Doing well with meds.  No SE problems. Does have exzema right now but derm doesn't want to RX meds for it. Mood has been ok.   GD is SR in HS and the family is doing better.  Plays golf well.   Gkids KG, 8th grade, 10th grade and SR in HS D tends to still call infrequently and "I just go with the flow". Added melatonin  and sleep is better. New doctor [place] Silos said he'd do lithium levels.  Past Psychiatric Medication Trials: Lamotrigine, Depakote, lithium, olanzapine 2.5 Buspar dizzy,  Benadryl HS helps sleep, mirtazapine 15, Trazodone,  Wellbutrin ,  NAC cut out bc NR noted and doesn't like so many pills.  melatonin    Review of Systems:  Review of Systems  HENT:  Positive for dental problem and voice change.   Cardiovascular:  Negative for palpitations.  Gastrointestinal:  Positive for diarrhea. Negative for nausea.  Musculoskeletal:  Positive for arthralgias and back pain.       Sees chiropracter  Skin:  Positive for rash.  Neurological:  Negative for dizziness, tremors, weakness and headaches.       Balance isn't great. No recent falls.  Psychiatric/Behavioral:  Positive for decreased concentration. Negative for agitation, behavioral problems, confusion, dysphoric mood, hallucinations, self-injury, sleep disturbance and suicidal ideas. The patient is not nervous/anxious and is not hyperactive.     Medications: I have reviewed the patient's current medications.  Current Outpatient Medications  Medication Sig Dispense Refill    Cholecalciferol 25 MCG (1000 UT) capsule Take 1 tablet by mouth daily.     estradiol (ESTRACE) 0.1 MG/GM vaginal cream Use 1/2 g vaginally two or three times per week as needed to maintain symptom relief. 42.5 g 1   levothyroxine (SYNTHROID) 88 MCG tablet TAKE 1 TABLET BY MOUTH ONCE DAILY BEFORE BREAKFAST 30 tablet 1   lidocaine (XYLOCAINE) 2 % jelly USE AS NEEDED FOR PAINFUL HEMORROIDS THREE TIMES DAILY 30 mL 1   loratadine (CLARITIN) 10 MG tablet Take 10 mg by mouth daily.     Melatonin 10 MG CAPS Take 1 tablet by mouth at bedtime.     omeprazole (PRILOSEC) 40 MG capsule Take 40 mg by mouth daily.  2   psyllium (METAMUCIL) 58.6 % packet Take by mouth.     QC PINK BISMUTH 262 MG chewable tablet      rosuvastatin (CRESTOR) 20 MG tablet Take 20 mg by mouth daily at 12 noon.     Calcium Citrate (CITRACAL PO) Take by mouth 2 (two) times daily.  (Patient not taking: Reported on 06/14/2021)     lithium carbonate 150 MG capsule Take one capsule by mouth every morning and two capsules at bedtime 270 capsule 1   mirtazapine (REMERON) 15 MG tablet Take 1 tablet (15 mg total) by mouth at bedtime. 90 tablet 1   No current facility-administered medications for this visit.    Medication Side Effects: None  Allergies:  Allergies  Allergen Reactions   Other Other (See Comments)    Allergic rhinitis symptoms     Past Medical History:  Diagnosis Date   Anxiety    Bipolar 1 disorder ([facility])    Breast mass, left    Cancer ([facility])    hx of skin cancer   Confusion    Depression    Eczema 2021   Fibroid    GERD (gastroesophageal reflux disease)    History of colon polyps    Hyperparathyroidism    Hypothyroidism    Leg lesion    Osteopenia    Restless leg    Thyroid disease    UTI (lower urinary tract infection)     Family History  Problem Relation Age of Onset   Alzheimer's disease Mother    Stroke Mother    Hypertension Mother    Diabetes Father    Alzheimer's disease Father    Cancer  Father  colon   Breast cancer Sister     Social History   Socioeconomic History   Marital status: Widowed    Spouse name: Not on file   Number of children: Not on file   Years of education: Not on file   Highest education level: Not on file  Occupational History   Not on file  Tobacco Use   Smoking status: Never   Smokeless tobacco: Never  Vaping Use   Vaping Use: Never used  Substance and Sexual Activity   Alcohol use: Yes    Alcohol/week: 1.0 standard drink of alcohol    Types: 1 Glasses of wine per week   Drug use: No   Sexual activity: Not Currently    Partners: Male    Birth control/protection: Post-menopausal  Other Topics Concern   Not on file  Social History Narrative   Not on file   Social Determinants of Health   Financial Resource Strain: Not on file  Food Insecurity: Not on file  Transportation Needs: Not on file  Physical Activity: Not on file  Stress: Not on file  Social Connections: Not on file  Intimate Partner Violence: Not on file    Past Medical History, Surgical history, Social history, and Family history were reviewed and updated as appropriate.   Please see review of systems for further details on the patient's review from today.   Objective:   Physical Exam:  LMP 01/10/2005 (Approximate)   Physical Exam Constitutional:      General: She is not in acute distress. Musculoskeletal:        General: No deformity.  Neurological:     Mental Status: She is alert and oriented to person, place, and time.     Motor: No tremor.     Coordination: Coordination normal.     Gait: Gait normal.  Psychiatric:        Attention and Perception: Attention and perception normal. She is attentive. She does not perceive auditory or visual hallucinations.        Mood and Affect: Mood is not anxious or depressed. Affect is not labile or angry.        Speech: Speech normal. Speech is not rapid and pressured.        Behavior: Behavior normal. Behavior  is not slowed.        Thought Content: Thought content normal. Thought content is not paranoid or delusional. Thought content does not include homicidal or suicidal ideation.        Cognition and Memory: Memory normal. Cognition is impaired.        Judgment: Judgment normal.     Comments: Neat. Insight and judgment fair. Mild forgetfulness and cognitive problems ongoing but stable and not worse over 2022-2023      Lab Review:     Component Value Date/Time   NA 142 07/16/2019 1453   K 3.9 07/16/2019 1453   CL 106 07/16/2019 1453   CO2 30 07/16/2019 1453   GLUCOSE 116 07/16/2019 1453   BUN 11 07/16/2019 1453   CREATININE 0.67 07/16/2019 1453   CALCIUM 10.9 (H) 07/16/2019 1453   GFRNONAA >90 09/11/2012 1330   GFRAA >90 09/11/2012 1330       Component Value Date/Time   WBC 8.3 09/11/2012 1330   RBC 4.45 09/11/2012 1330   HGB 13.7 09/11/2012 1330   HCT 42.4 09/11/2012 1330   PLT 186 09/11/2012 1330   MCV 95.3 09/11/2012 1330   MCH 30.8 09/11/2012 1330   MCHC  32.3 09/11/2012 1330   RDW 13.4 09/11/2012 1330   LYMPHSABS 1.1 09/11/2012 1330   MONOABS 0.6 09/11/2012 1330   EOSABS 0.1 09/11/2012 1330   BASOSABS 0.0 09/11/2012 1330    Lithium Lvl  Date Value Ref Range Status  07/16/2019 0.5 (L) 0.6 - 1.2 mmol/L Final    Component 12/15/20 11/02/20 06/30/20 03/09/20 06/12/19 11/17/16  Lithium Level 0.5 Low   0.5 Low   0.6  0.6 0.6 0.6   Component 12/15/20 11/02/20 06/30/20 03/09/20 10/14/19 06/12/19  TSH 0.27 Low   0.06 Low   0.95  2.550  0.608  2.794    11/02/2020 calcium 10.8, creatinine 0.62, vitamin D 97 on 1000 U daily at [place]  06/21/21 lithium 0.6  No results found for: "PHENYTOIN", "PHENOBARB", "VALPROATE", "CBMZ"   Bone density test osteoporosis  Hips and spine.    .res Assessment: Plan:    Bipolar II disorder ([employer]) - Plan: lithium carbonate 150 MG capsule  Generalized anxiety disorder - Plan: mirtazapine (REMERON) 15 MG  tablet  Insomnia due to mental condition - Plan: mirtazapine (REMERON) 15 MG tablet  Lithium use - Plan: lithium carbonate 150 MG capsule    Greater than 50% of  30 min face to face time with patient was spent on counseling and coordination of care. Counseled patient regarding potential benefits, risks, and side effects of lithium to include potential risk of lithium affecting thyroid and renal functon.  Discussed need for periodic lab monitoring to determine drug level and to assess for potential adverse effects.  Counseled patient regarding signs and symptoms of lithium toxicity and advised that they notify office immediately or seek urgent medical attention if experiencing these signs and symptoms.  Patient advised to contact office with any questions or concerns.  She has a history of hyperparathyroidism and is aware that lithium can increase calcium levels.  At her age it would be very risky to attempt to find an alternative mood stabilizer when lithium has been effective.  Alternatives would expose her to different types of side effects such as those with the atypical antipsychotics including tardive dyskinesia.  Borderline mild cognitive impairment but she is able to care for herself adequately. Episodically worse without known triggers.  But appears stable with no worsening over this 2022. May be better off BZ   She feels her mood and anxiety and sleep are pretty stable except as noted.  She has been on lithium for many years with good response.  Specifically stopping lithium which has helped her for decades would be highly risky for significant mood destabilization which could last for months and expose her to risks of atypical antipsychotics which would be the alternative to lithium.  She's already failed alternative of Depakote. She doesn't want to change the lithium.  Overall doing well despite very low lithium levels.  Med sensitve  Insomnia is managed with medications at [facility].  Some  possible limited sx panic with episodes of SOB and hyperventilation and brreathing in brown bag.  Disc cognitive risks with OTC sleep aids.  Keep them at a minimum if possible.     Sleep better with mirtazapine 15 mg HS.   No med changes Continue lithium 150 mg every morning and 300 mg nightly,  mirtazapine 15 mg nightly. Not on a benzodiazepine at this time Labs stable 6/23 including normal BMP  Repeat lithium level every 6 mos bc is on low dose with low level.  As noted it is stable. Extensive discussion  of various labs.  Followed closely by internal med and endo  FU 3-4 mos  Lynder Parents, MD, DFAPA   Please see After Visit Summary for patient specific instructions.  Future Appointments  Date Time Provider [place]  09/21/2022 11:00 AM Yisroel Ramming, Everardo All, MD [facility] None      No orders of the defined types were placed in this encounter.      -------------------------------

## 2021-09-23 ENCOUNTER — Telehealth: Payer: Self-pay | Admitting: Psychiatry

## 2021-09-23 NOTE — Telephone Encounter (Signed)
Amy [name] called this morning at 10:15 tor request a refill of Lorazepam, 0.5 mg.  She hasn't taken this in some time but is experiencing some family issues and needs a little something to help her through this time.  Apt 12/6.  Send to [facility] on [address].

## 2021-09-23 NOTE — Telephone Encounter (Signed)
Last filled 06/18/20

## 2021-09-24 ENCOUNTER — Other Ambulatory Visit: Payer: Self-pay | Admitting: Psychiatry

## 2021-09-24 MED ORDER — LORAZEPAM 0.5 MG PO TABS: ORAL_TABLET | ORAL | 0 refills | Status: DC

## 2021-09-24 NOTE — Telephone Encounter (Signed)
Called patient and received message that VM has not been activated.

## 2021-09-24 NOTE — Telephone Encounter (Signed)
Patient last seen 9/7. Patient said there are some family dynamics currently that are causing a great deal of stress. She is asking if she can get some lorazepam to help with sleep. She last filled 06/18/20. No other complaints. F/U in December.   Pharmacy is [facility] on [address].

## 2021-09-24 NOTE — Telephone Encounter (Signed)
Amy [name] prescription for lorazepam 0.5 mg twice daily as needed anxiety #20 no refills

## 2021-09-25 DIAGNOSIS — Z23 Encounter for immunization: Secondary | ICD-10-CM | POA: Diagnosis not present

## 2021-09-26 ENCOUNTER — Telehealth: Payer: Self-pay

## 2021-09-26 NOTE — Telephone Encounter (Signed)
Prior Authorization submitted and approved for Lorazepam 0.5 mg #20 with [facility] CaseId:[id number]:08/27/2021 through :09/26/2022;

## 2021-10-06 DIAGNOSIS — Z23 Encounter for immunization: Secondary | ICD-10-CM | POA: Diagnosis not present

## 2021-10-20 DIAGNOSIS — Z5189 Encounter for other specified aftercare: Secondary | ICD-10-CM | POA: Diagnosis not present

## 2021-10-20 DIAGNOSIS — C44319 Basal cell carcinoma of skin of other parts of face: Secondary | ICD-10-CM | POA: Diagnosis not present

## 2021-11-17 DIAGNOSIS — M810 Age-related osteoporosis without current pathological fracture: Secondary | ICD-10-CM | POA: Diagnosis not present

## 2021-11-22 DIAGNOSIS — E210 Primary hyperparathyroidism: Secondary | ICD-10-CM | POA: Diagnosis not present

## 2021-11-22 DIAGNOSIS — R7309 Other abnormal glucose: Secondary | ICD-10-CM | POA: Diagnosis not present

## 2021-11-22 DIAGNOSIS — M810 Age-related osteoporosis without current pathological fracture: Secondary | ICD-10-CM | POA: Diagnosis not present

## 2021-11-22 DIAGNOSIS — F319 Bipolar disorder, unspecified: Secondary | ICD-10-CM | POA: Diagnosis not present

## 2021-11-22 DIAGNOSIS — R748 Abnormal levels of other serum enzymes: Secondary | ICD-10-CM | POA: Diagnosis not present

## 2021-11-22 DIAGNOSIS — E890 Postprocedural hypothyroidism: Secondary | ICD-10-CM | POA: Diagnosis not present

## 2021-11-23 DIAGNOSIS — E890 Postprocedural hypothyroidism: Secondary | ICD-10-CM | POA: Diagnosis not present

## 2021-11-23 DIAGNOSIS — E210 Primary hyperparathyroidism: Secondary | ICD-10-CM | POA: Diagnosis not present

## 2021-11-23 DIAGNOSIS — Z79890 Hormone replacement therapy (postmenopausal): Secondary | ICD-10-CM | POA: Diagnosis not present

## 2021-11-23 DIAGNOSIS — M810 Age-related osteoporosis without current pathological fracture: Secondary | ICD-10-CM | POA: Diagnosis not present

## 2021-12-15 ENCOUNTER — Ambulatory Visit (INDEPENDENT_AMBULATORY_CARE_PROVIDER_SITE_OTHER): Payer: Medicare Other | Admitting: Psychiatry

## 2021-12-15 ENCOUNTER — Encounter: Payer: Self-pay | Admitting: Psychiatry

## 2021-12-15 DIAGNOSIS — F411 Generalized anxiety disorder: Secondary | ICD-10-CM | POA: Diagnosis not present

## 2021-12-15 DIAGNOSIS — F3181 Bipolar II disorder: Secondary | ICD-10-CM

## 2021-12-15 DIAGNOSIS — F5105 Insomnia due to other mental disorder: Secondary | ICD-10-CM | POA: Diagnosis not present

## 2021-12-15 DIAGNOSIS — Z79899 Other long term (current) drug therapy: Secondary | ICD-10-CM | POA: Diagnosis not present

## 2021-12-15 MED ORDER — LORAZEPAM 0.5 MG PO TABS: ORAL_TABLET | ORAL | 0 refills | Status: DC

## 2021-12-15 NOTE — Progress Notes (Signed)
Amy [name] [id number] 1945-05-21 76 y.o.   Subjective:   [name] is a 76 y.o. (DOB 1945/08/06) female.  Chief Complaint:  Chief Complaint  Patient presents with   Follow-up    Bipolar II disorder ([place])   Anxiety    Anxiety Patient reports no confusion, decreased concentration, dizziness, nausea, nervous/anxious behavior, palpitations or suicidal ideas.     Amy [name] presents to the office today for follow-up of mood, anxiety and STM problems.    seen November 2020.  The following was noted: She moved to this appointment earlier. D in on the appt Amy [name]. Not feeling well for over a week.  Staying with Amy [name] for a week.   Problems with diarrhea.  Using immodium. More tremor and confusion, poor memory, and poor handwriting.  D notes sleeping a lot and is cold.   Has stayed well re: viral illness.  Has been able to still meet with 8 neighbors and that has continued to help her mental health.   Notices more trouble with word-finding and fluency and that bothers her.   Things better lately with D, this has been the chronic stressor for years.  Doesn't know why but pleased.  Disc other stressors with a recent friend when she tried to reconcile with him and he refused. No meds were changed.  05/10/2019 appointment, the following is noted: Disc lithium level variations from November 0.7 to April 0.4 with one in between of 1.7 at Amy [name] office without any changes in dosages.  No explanation for that change. Had been dealing with diarrhea but Amy [name] changed med added Florastor which helped.  Worries about the cat not sleeping with her.   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  Struggling to learn a new phone.  Can do Facebook. No med change.  08/05/19 appt with the following noted: Up and down.  Recent period of confusion.  Had a problem with one of neighbors. Asked for daughter's help and she came by to help. No problems driving.  Word  finding issues. Does her own shopping and pays bills. She went for follow up to labs July 6 and did not have UTI. Not anxious since being more social.  Not as easily upset. On same doses of lorazepam 0.5 mg HS, mirtazapine 7.5 mg HS, lithium 150 mg 1 cap AM and 2 HS. Some awakening hungry at night lately. Plan: no med changes  11/27/19 appt with following noted: Problems with GI vomiting diarrhea, skin problems with med changes.  Skin problem is better some with topicals.  N and diarrhea might be stress related bc is better now that keeps her distance from stressful neighbor.   They wondered about her stopping all meds to reevaluate. On same doses of lorazepam 0.5 mg HS, mirtazapine 7.5 mg HS, lithium 150 mg 1 cap AM and 2 HS. Sleep is good with meds.    Plan no med changes but check labs  03/11/2020 appointment with the following noted: Labs were checked and noted.  FU endocrinology tomorrow  No SE with meds. Still walking with other women. Covid and weather limit mood.  Scrapbooking helps.  GS visits and helps her with things. Needs colonoscopy.  Hip pain. Less confusion.  But can be a little forgetful.  Still can manage her own activities. Doesn't like to cook.  Likes her apt.  Plan: no med changes  06/10/20 appt with following noted: Neighbor group has split up.  New resident came in  and split things up.  Colonoscopy pending for August bc concerns  Raised at PE. Sleep not as good.  Problems with neighbors bothers her sleep.  Delayed sleep phase  Went to [place] No SE with meds Plan: Disc alternative sleeper.  First try increase mirtazapine to 15 mg HS to see if sleep better. Disc alternative sleeper.  First try increase mirtazapine to 15 mg HS to see if sleep better.   09/15/2020 appointment with the following noted: Patient has had some confusion around how to take the mirtazapine.  At times she has had 15 mg tablets and other x7.5 mg tablets and apparently has taken up to 30 mg. Been  behaving and staying out of trouble.  Saw family last weekend and had a good time. Has been taking mirtazapine 15 as 2 of the 7.5 mg tablets and sleeping well.   Lost medical doctors, Amy [name] and Amy [name]. Plan: Check lithium level No med changes  12/16/2020 appointment with the following noted: Disc medical visits. Less socialization DT weather.  But is walking with 2 other ladies. Hopes for frequent visits with family.  Not as regular visits as she would like but did see them at Thanksgiving.  Lonely right now. Ongoing GI problems and plans to see a doctor Plan: No med changes Continue lithium 150 mg every morning and 300 mg nightly, mirtazapine 15 mg nightly. Not on a benzodiazepine at this time  03/17/2021 appointment with the following noted: Personally doing very well.  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 Was keeping them but now it's intermittent.  Health is OK lately. Not walking as much DT weather. Naps some.   Once or twice a month gets sx of hyperventilation and breathes into a brown bag until it gets better.  Thinks it's the [name].  Occ will wake her up. Doesn't cook much.  But buys prepared foods at grocery store. Is gathering outside sometimes with 6-10 ladies. Patient reports stable mood and denies depressed or irritable moods.  Patient has some chronic difficulty with anxiety situationally but not worse.  Patient denies difficulty with sleep initiation or maintenance usually.  Takes OTC sleep aid and needs it to sleep.  No hangover. Denies appetite disturbance.  Patient reports that energy and motivation have been good.  Patient worsening difficulty with concentration and memory.  Gets times confused.  No missed appts..  Patient denies any suicidal ideation. Saw Amy [name] in January and the labs and he had no concerns about her labs.  Normal EKG  06/14/21 appt noted: Weight stable 129#. Argument with lady at circle on Friday.  Upsetting.  Pt says she will not go  to the circle for awhile.  Has another women's social group.  Group is gossiping. 76 yo GD having some mental problems.  Disc this. Sleep well. And some napping. Not sig depressed.  Tolerating meds. [name] is aggravating. Amy [name] retired.  09/16/21 appt noted: Behaving myself.   Friends having health problems worrisome.  Still friends at [employer].  There's fox bothering their cars. Digging up their area. Doing well with meds.  No SE problems. Does have exzema right now but derm doesn't want to RX meds for it. Mood has been ok.   GD is SR in HS and the family is doing better.  Plays golf well.   Gkids KG, 8th grade, 10th grade and SR in HS D tends to still call infrequently and "I just go with the flow". Added melatonin and sleep  is better. New doctor Grandville Silos said he'd do lithium levels. Plan:  No med changes Continue lithium 150 mg every morning and 300 mg nightly,  mirtazapine 15 mg nightly. Not on a benzodiazepine at this time Labs stable 6/23 including normal BMP   12/15/21 appt noted: Mood often determined by D's mood and she is in a great mood right now.  They spontaneously visited recently.  They helped her fix some things.   Favorite GS voted most likely to make you smile in his class.  So her mood is good.   Would llike some lorazepam prn for anxiety.  About every other week without trigger gets anxious with SOB and needs lorazepam.  Those episodes last about a minute or so.  Some of the neighbors can be nasty and generates stress. Goes to [employer] for exercising.    Past Psychiatric Medication Trials: Lamotrigine, Depakote, lithium, olanzapine 2.5 Buspar dizzy,  Benadryl HS helps sleep, mirtazapine 15, Trazodone,  Wellbutrin ,  NAC cut out bc NR noted and doesn't like so many pills.  melatonin    Review of Systems:  Review of Systems  HENT:  Positive for dental problem and voice change.   Cardiovascular:  Negative for palpitations.  Gastrointestinal:   Positive for diarrhea. Negative for nausea.  Musculoskeletal:  Positive for arthralgias and back pain.       Sees chiropracter  Skin:  Positive for rash.  Neurological:  Negative for dizziness, tremors, weakness and headaches.       Balance isn't great. No recent falls.  Psychiatric/Behavioral:  Negative for agitation, behavioral problems, confusion, decreased concentration, dysphoric mood, hallucinations, self-injury, sleep disturbance and suicidal ideas. The patient is not nervous/anxious and is not hyperactive.     Medications: I have reviewed the patient's current medications.  Current Outpatient Medications  Medication Sig Dispense Refill   Cholecalciferol 25 MCG (1000 UT) capsule Take 1 tablet by mouth daily.     estradiol (ESTRACE) 0.1 MG/GM vaginal cream Use 1/2 g vaginally two or three times per week as needed to maintain symptom relief. 42.5 g 1   levothyroxine (SYNTHROID) 88 MCG tablet TAKE 1 TABLET BY MOUTH ONCE DAILY BEFORE BREAKFAST 30 tablet 1   lidocaine (XYLOCAINE) 2 % jelly USE AS NEEDED FOR PAINFUL HEMORROIDS THREE TIMES DAILY 30 mL 1   lithium carbonate 150 MG capsule Take one capsule by mouth every morning and two capsules at bedtime 270 capsule 1   loratadine (CLARITIN) 10 MG tablet Take 10 mg by mouth daily.     Melatonin 10 MG CAPS Take 1 tablet by mouth at bedtime.     mirtazapine (REMERON) 15 MG tablet Take 1 tablet (15 mg total) by mouth at bedtime. 90 tablet 1   omeprazole (PRILOSEC) 40 MG capsule Take 40 mg by mouth daily.  2   psyllium (METAMUCIL) 58.6 % packet Take by mouth.     QC PINK BISMUTH 262 MG chewable tablet      rosuvastatin (CRESTOR) 20 MG tablet Take 20 mg by mouth daily at 12 noon.     Calcium Citrate (CITRACAL PO) Take by mouth 2 (two) times daily.  (Patient not taking: Reported on 06/14/2021)     LORazepam (ATIVAN) 0.5 MG tablet 1 tablet twice daily as needed for anxiety 20 tablet 0   No current facility-administered medications for this visit.     Medication Side Effects: None  Allergies:  Allergies  Allergen Reactions   Other Other (See Comments)    Allergic  rhinitis symptoms     Past Medical History:  Diagnosis Date   Anxiety    Bipolar 1 disorder ([facility])    Breast mass, left    Cancer ([facility])    hx of skin cancer   Confusion    Depression    Eczema 2021   Fibroid    GERD (gastroesophageal reflux disease)    History of colon polyps    Hyperparathyroidism    Hypothyroidism    Leg lesion    Osteopenia    Restless leg    Thyroid disease    UTI (lower urinary tract infection)     Family History  Problem Relation Age of Onset   Alzheimer's disease Mother    Stroke Mother    Hypertension Mother    Diabetes Father    Alzheimer's disease Father    Cancer Father        colon   Breast cancer Sister     Social History   Socioeconomic History   Marital status: Widowed    Spouse name: Not on file   Number of children: Not on file   Years of education: Not on file   Highest education level: Not on file  Occupational History   Not on file  Tobacco Use   Smoking status: Never   Smokeless tobacco: Never  Vaping Use   Vaping Use: Never used  Substance and Sexual Activity   Alcohol use: Yes    Alcohol/week: 1.0 standard drink of alcohol    Types: 1 Glasses of wine per week   Drug use: No   Sexual activity: Not Currently    Partners: Male    Birth control/protection: Post-menopausal  Other Topics Concern   Not on file  Social History Narrative   Not on file   Social Determinants of Health   Financial Resource Strain: Not on file  Food Insecurity: Not on file  Transportation Needs: Not on file  Physical Activity: Not on file  Stress: Not on file  Social Connections: Not on file  Intimate Partner Violence: Not on file    Past Medical History, Surgical history, Social history, and Family history were reviewed and updated as appropriate.   Please see review of systems for further details on the  patient's review from today.   Objective:   Physical Exam:  LMP 01/10/2005 (Approximate)   Physical Exam Constitutional:      General: She is not in acute distress. Musculoskeletal:        General: No deformity.  Neurological:     Mental Status: She is alert and oriented to person, place, and time.     Motor: No tremor.     Coordination: Coordination normal.     Gait: Gait normal.  Psychiatric:        Attention and Perception: Attention and perception normal. She is attentive. She does not perceive auditory or visual hallucinations.        Mood and Affect: Mood is not anxious or depressed. Affect is not labile or angry.        Speech: Speech normal. Speech is not rapid and pressured.        Behavior: Behavior normal. Behavior is not slowed.        Thought Content: Thought content normal. Thought content is not paranoid or delusional. Thought content does not include homicidal or suicidal ideation.        Cognition and Memory: Memory normal. Cognition is impaired.        Judgment:  Judgment normal.     Comments: Neat. Insight and judgment fair. Mild forgetfulness and cognitive problems ongoing but stable and not worse over 2022-2023 Doesnot appear to be impairing.      Lab Review:     Component Value Date/Time   NA 142 07/16/2019 1453   K 3.9 07/16/2019 1453   CL 106 07/16/2019 1453   CO2 30 07/16/2019 1453   GLUCOSE 116 07/16/2019 1453   BUN 11 07/16/2019 1453   CREATININE 0.67 07/16/2019 1453   CALCIUM 10.9 (H) 07/16/2019 1453   GFRNONAA >90 09/11/2012 1330   GFRAA >90 09/11/2012 1330       Component Value Date/Time   WBC 8.3 09/11/2012 1330   RBC 4.45 09/11/2012 1330   HGB 13.7 09/11/2012 1330   HCT 42.4 09/11/2012 1330   PLT 186 09/11/2012 1330   MCV 95.3 09/11/2012 1330   MCH 30.8 09/11/2012 1330   MCHC 32.3 09/11/2012 1330   RDW 13.4 09/11/2012 1330   LYMPHSABS 1.1 09/11/2012 1330   MONOABS 0.6 09/11/2012 1330   EOSABS 0.1 09/11/2012 1330   BASOSABS  0.0 09/11/2012 1330    Lithium Lvl  Date Value Ref Range Status  07/16/2019 0.5 (L) 0.6 - 1.2 mmol/L Final    Component 12/15/20 11/02/20 06/30/20 03/09/20 06/12/19 11/17/16  Lithium Level 0.5 Low   0.5 Low   0.6  0.6 0.6 0.6   Component 12/15/20 11/02/20 06/30/20 03/09/20 10/14/19 06/12/19  TSH 0.27 Low   0.06 Low   0.95  2.550  0.608  2.794    11/02/2020 calcium 10.8, creatinine 0.62, vitamin D 97 on 1000 U daily at [place]  06/21/21 lithium 0.6 11/22/21 lithium 0.5 stable., normal D, TSH, CMP, normal Cr and Calcium 10.4  No results found for: "PHENYTOIN", "PHENOBARB", "VALPROATE", "CBMZ"   Bone density test osteoporosis  Hips and spine.    .res Assessment: Plan:    Bipolar II disorder ([place])  Generalized anxiety disorder - Plan: LORazepam (ATIVAN) 0.5 MG tablet  Insomnia due to mental condition  Lithium use    Greater than 50% of  30 min face to face time with patient was spent on counseling and coordination of care. Counseled patient regarding potential benefits, risks, and side effects of lithium to include potential risk of lithium affecting thyroid and renal functon.  Discussed need for periodic lab monitoring to determine drug level and to assess for potential adverse effects.  Counseled patient regarding signs and symptoms of lithium toxicity and advised that they notify office immediately or seek urgent medical attention if experiencing these signs and symptoms.  Patient advised to contact office with any questions or concerns.  She has a history of hyperparathyroidism and is aware that lithium can increase calcium levels.  At her age it would be very risky to attempt to find an alternative mood stabilizer when lithium has been effective.  Alternatives would expose her to different types of side effects such as those with the atypical antipsychotics including tardive dyskinesia.  Borderline mild cognitive impairment but she is able to care for herself  adequately. Episodically worse without known triggers.  But appears stable with no worsening over this 2022. May be better off BZ   She feels her mood and anxiety and sleep are pretty stable except as noted.  She has been on lithium for many years with good response.  Specifically stopping lithium which has helped her for decades would be highly risky for significant mood destabilization which could last  for months and expose her to risks of atypical antipsychotics which would be the alternative to lithium.  She's already failed alternative of Depakote. She doesn't want to change the lithium.  Overall doing well despite very low lithium levels.  Med sensitve  Insomnia is managed with medications at [facility].  Some possible limited sx panic with episodes of SOB and hyperventilation and brreathing in brown bag.  Disc cognitive risks with OTC sleep aids.  Keep them at a minimum if possible.     Sleep better with mirtazapine 15 mg HS.  Encourage continued exercise including balance and yoga.   No med changes indicated:  Continue lithium 150 mg every morning and 300 mg nightly,  mirtazapine 15 mg nightly. on a benzodiazepine prn for brief panic like sx.  Neg med workup.  No evidence of abuse/misuse. Labs stable 6/23 including normal BMP  Repeat lithium level every 6 mos bc is on low dose with low level.  As noted it is stable. Extensive discussion of various labs.  Followed closely by internal med and endo.  Is doing well with labs.  FU 3-4 mos  Lynder Parents, MD, DFAPA   Please see After Visit Summary for patient specific instructions.  Future Appointments  Date Time Provider [place]  09/21/2022 11:00 AM Yisroel Ramming, Everardo All, MD [facility] None      No orders of the defined types were placed in this encounter.      -------------------------------

## 2021-12-22 DIAGNOSIS — L821 Other seborrheic keratosis: Secondary | ICD-10-CM | POA: Diagnosis not present

## 2021-12-22 DIAGNOSIS — L570 Actinic keratosis: Secondary | ICD-10-CM | POA: Diagnosis not present

## 2021-12-22 DIAGNOSIS — L820 Inflamed seborrheic keratosis: Secondary | ICD-10-CM | POA: Diagnosis not present

## 2021-12-22 DIAGNOSIS — L853 Xerosis cutis: Secondary | ICD-10-CM | POA: Diagnosis not present

## 2022-01-10 HISTORY — PX: MOHS SURGERY: SUR867

## 2022-02-22 ENCOUNTER — Telehealth: Payer: Self-pay | Admitting: Psychiatry

## 2022-02-22 NOTE — Telephone Encounter (Signed)
Amy [name] called at 2:45 to report that she is seeing things that are right.  She had an appt with her eye dr. And was told her eyes are fine.  She may see her cat on the couch to her left, but then to the right she sees her catting walking away like in the shadows.  She saw a deer climbing her fench and then it multiplied.  She knew that wasn't right.  She is seeing her PCP, Dr. Reynaldo Minium, tomorrow for her annual physical and will have labs done for that appt.  She is wondering if Dr. Clovis Pu would want to add a lab to check med levels or other things that may be causing these illusions.  Please advise

## 2022-02-23 DIAGNOSIS — E559 Vitamin D deficiency, unspecified: Secondary | ICD-10-CM | POA: Diagnosis not present

## 2022-02-23 DIAGNOSIS — I10 Essential (primary) hypertension: Secondary | ICD-10-CM | POA: Diagnosis not present

## 2022-02-23 DIAGNOSIS — E039 Hypothyroidism, unspecified: Secondary | ICD-10-CM | POA: Diagnosis not present

## 2022-02-23 DIAGNOSIS — E785 Hyperlipidemia, unspecified: Secondary | ICD-10-CM | POA: Diagnosis not present

## 2022-02-23 DIAGNOSIS — F419 Anxiety disorder, unspecified: Secondary | ICD-10-CM | POA: Diagnosis not present

## 2022-02-23 DIAGNOSIS — K219 Gastro-esophageal reflux disease without esophagitis: Secondary | ICD-10-CM | POA: Diagnosis not present

## 2022-02-24 NOTE — Telephone Encounter (Signed)
Patient aware Dr. Clovis Pu is out of town. She did have labs drawn yesterday in Dr. Jacquiline Doe office, but she doesn't know if a lithium level was drawn. Lab results are not available currently.

## 2022-02-28 NOTE — Telephone Encounter (Signed)
I don't see anything about her medicines that would be related.  Unlikely that it is psychiatric either.  She should discuss it with her PCP

## 2022-02-28 NOTE — Telephone Encounter (Signed)
Patient had labs drawn at [facility]. They are available in [place]. I didn't see a lithium level.

## 2022-03-01 NOTE — Telephone Encounter (Signed)
LVM to [facility] 

## 2022-03-01 NOTE — Telephone Encounter (Signed)
Patient notified. Will see her PCP tomorrow.

## 2022-03-02 DIAGNOSIS — K219 Gastro-esophageal reflux disease without esophagitis: Secondary | ICD-10-CM | POA: Diagnosis not present

## 2022-03-02 DIAGNOSIS — E785 Hyperlipidemia, unspecified: Secondary | ICD-10-CM | POA: Diagnosis not present

## 2022-03-02 DIAGNOSIS — Z0000 Encounter for general adult medical examination without abnormal findings: Secondary | ICD-10-CM | POA: Diagnosis not present

## 2022-03-02 DIAGNOSIS — R82998 Other abnormal findings in urine: Secondary | ICD-10-CM | POA: Diagnosis not present

## 2022-03-02 DIAGNOSIS — N39498 Other specified urinary incontinence: Secondary | ICD-10-CM | POA: Diagnosis not present

## 2022-03-02 DIAGNOSIS — Z1339 Encounter for screening examination for other mental health and behavioral disorders: Secondary | ICD-10-CM | POA: Diagnosis not present

## 2022-03-02 DIAGNOSIS — Z733 Stress, not elsewhere classified: Secondary | ICD-10-CM | POA: Diagnosis not present

## 2022-03-02 DIAGNOSIS — E039 Hypothyroidism, unspecified: Secondary | ICD-10-CM | POA: Diagnosis not present

## 2022-03-02 DIAGNOSIS — E559 Vitamin D deficiency, unspecified: Secondary | ICD-10-CM | POA: Diagnosis not present

## 2022-03-02 DIAGNOSIS — Z1331 Encounter for screening for depression: Secondary | ICD-10-CM | POA: Diagnosis not present

## 2022-03-17 ENCOUNTER — Encounter: Payer: Self-pay | Admitting: Psychiatry

## 2022-03-17 ENCOUNTER — Ambulatory Visit (INDEPENDENT_AMBULATORY_CARE_PROVIDER_SITE_OTHER): Payer: Medicare Other | Admitting: Psychiatry

## 2022-03-17 DIAGNOSIS — F3181 Bipolar II disorder: Secondary | ICD-10-CM

## 2022-03-17 DIAGNOSIS — F5105 Insomnia due to other mental disorder: Secondary | ICD-10-CM

## 2022-03-17 DIAGNOSIS — Z79899 Other long term (current) drug therapy: Secondary | ICD-10-CM

## 2022-03-17 DIAGNOSIS — F411 Generalized anxiety disorder: Secondary | ICD-10-CM

## 2022-03-17 MED ORDER — MIRTAZAPINE 15 MG PO TABS: 15.0000 mg | ORAL_TABLET | Freq: Every day | ORAL | 1 refills | Status: DC

## 2022-03-17 MED ORDER — LITHIUM CARBONATE 150 MG PO CAPS: ORAL_CAPSULE | ORAL | 1 refills | Status: DC

## 2022-03-17 MED ORDER — LORAZEPAM 0.5 MG PO TABS: ORAL_TABLET | ORAL | 0 refills | Status: DC

## 2022-03-17 NOTE — Progress Notes (Signed)
Amy [name] RP:[id number] 1945-07-25 77 y.o.   Subjective:   [name] is a 77 y.o. (DOB 1945/04/23) female.  Chief Complaint:  Chief Complaint  Patient presents with   Follow-up   Anxiety   Stress    Anxiety Patient reports no confusion, decreased concentration, dizziness, nausea, nervous/anxious behavior, palpitations or suicidal ideas.     Amy [name] presents to the office today for follow-up of mood, anxiety and STM problems.    seen November 2020.  The following was noted: She moved to this appointment earlier. Amy [name] in on the appt Amy [name]. Not feeling well for over a week.  Staying with Amy [name] for a week.   Problems with diarrhea.  Using immodium. More tremor and confusion, poor memory, and poor handwriting.  Amy [name] notes sleeping a lot and is cold.   Has stayed well re: viral illness.  Has been able to still meet with 8 neighbors and that has continued to help her mental health.   Notices more trouble with word-finding and fluency and that bothers her.   Things better lately with Amy [name], this has been the chronic stressor for years.  Doesn't know why but pleased.  Disc other stressors with a recent friend when she tried to reconcile with him and he refused. No meds were changed.  05/10/2019 appointment, the following is noted: Disc lithium level variations from November 0.7 to April 0.4 with one in between of 1.7 at Amy [name] office without any changes in dosages.  No explanation for that change. Had been dealing with diarrhea but Amy [name] changed med added Florastor which helped.  Worries about the cat not sleeping with her.   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  Struggling to learn a new phone.  Can do Facebook. No med change.  08/05/19 appt with the following noted: Up and down.  Recent period of confusion.  Had a problem with one of neighbors. Asked for daughter's help and she came by to help. No problems driving.  Word finding issues.  Does her own shopping and pays bills. She went for follow up to labs July 6 and did not have UTI. Not anxious since being more social.  Not as easily upset. On same doses of lorazepam 0.5 mg HS, mirtazapine 7.5 mg HS, lithium 150 mg 1 cap AM and 2 HS. Some awakening hungry at night lately. Plan: no med changes  11/27/19 appt with following noted: Problems with GI vomiting diarrhea, skin problems with med changes.  Skin problem is better some with topicals.  N and diarrhea might be stress related bc is better now that keeps her distance from stressful neighbor.   They wondered about her stopping all meds to reevaluate. On same doses of lorazepam 0.5 mg HS, mirtazapine 7.5 mg HS, lithium 150 mg 1 cap AM and 2 HS. Sleep is good with meds.    Plan no med changes but check labs  03/11/2020 appointment with the following noted: Labs were checked and noted.  FU endocrinology tomorrow  No SE with meds. Still walking with other women. Covid and weather limit mood.  Scrapbooking helps.  Amy [name] visits and helps her with things. Needs colonoscopy.  Hip pain. Less confusion.  But can be a little forgetful.  Still can manage her own activities. Doesn't like to cook.  Likes her apt.  Plan: no med changes  06/10/20 appt with following noted: Neighbor group has split up.  New resident came in and split things up.  Colonoscopy pending for August bc concerns  Raised at PE. Sleep not as good.  Problems with neighbors bothers her sleep.  Delayed sleep phase  Went to Movie No SE with meds Plan: Disc alternative sleeper.  First try increase mirtazapine to 15 mg HS to see if sleep better. Disc alternative sleeper.  First try increase mirtazapine to 15 mg HS to see if sleep better.   09/15/2020 appointment with the following noted: Patient has had some confusion around how to take the mirtazapine.  At times she has had 15 mg tablets and other x7.5 mg tablets and apparently has taken up to 30 mg. Been behaving and  staying out of trouble.  Saw family last weekend and had a good time. Has been taking mirtazapine 15 as 2 of the 7.5 mg tablets and sleeping well.   Lost medical doctors, Amy [name] and Amy [name]. Plan: Check lithium level No med changes  12/16/2020 appointment with the following noted: Disc medical visits. Less socialization DT weather.  But is walking with 2 other ladies. Hopes for frequent visits with family.  Not as regular visits as she would like but did see them at Thanksgiving.  Lonely right now. Ongoing GI problems and plans to see a doctor Plan: No med changes Continue lithium 150 mg every morning and 300 mg nightly, mirtazapine 15 mg nightly. Not on a benzodiazepine at this time  03/17/2021 appointment with the following noted: Personally doing very well.  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 Was keeping them but now it's intermittent.  Health is OK lately. Not walking as much DT weather. Naps some.   Once or twice a month gets sx of hyperventilation and breathes into a brown bag until it gets better.  Thinks it's the Amy [name].  Occ will wake her up. Doesn't cook much.  But buys prepared foods at grocery store. Is gathering outside sometimes with 6-10 ladies. Patient reports stable mood and denies depressed or irritable moods.  Patient has some chronic difficulty with anxiety situationally but not worse.  Patient denies difficulty with sleep initiation or maintenance usually.  Takes OTC sleep aid and needs it to sleep.  No hangover. Denies appetite disturbance.  Patient reports that energy and motivation have been good.  Patient worsening difficulty with concentration and memory.  Gets times confused.  No missed appts..  Patient denies any suicidal ideation. Saw Dr. Yetta [name] in January and the labs and he had no concerns about her labs.  Normal EKG  06/14/21 appt noted: Weight stable 77#. Argument with lady at circle on Friday.  Upsetting.  Pt says she will not go to the circle  for awhile.  Has another women's social group.  Group is gossiping. 77 yo GD having some mental problems.  Disc this. Sleep well. And some napping. Not sig depressed.  Tolerating meds. Amy [name] is aggravating. Amy [name] retired.  09/16/21 appt noted: Behaving myself.   Friends having health problems worrisome.  Still friends at [employer].  There's fox bothering their cars. Digging up their area. Doing well with meds.  No SE problems. Does have exzema right now but derm doesn't want to RX meds for it. Mood has been ok.   GD is SR in HS and the family is doing better.  Plays golf well.   Gkids KG, 8th grade, 10th grade and SR in HS Amy [name] tends to still call infrequently and "I just go with the flow". Added melatonin and sleep is better. New doctor Grandville Silos  said he'd do lithium levels. Plan:  No med changes Continue lithium 150 mg every morning and 300 mg nightly,  mirtazapine 15 mg nightly. Not on a benzodiazepine at this time Labs stable 6/23 including normal BMP   12/15/21 appt noted: Mood often determined by Amy [name]'s mood and she is in a great mood right now.  They spontaneously visited recently.  They helped her fix some things.   Favorite Amy [name] voted most likely to make you smile in his class.  So her mood is good.   Would llike some lorazepam prn for anxiety.  About every other week without trigger gets anxious with SOB and needs lorazepam.  Those episodes last about a minute or so.  Some of the neighbors can be nasty and generates stress. Goes to [employer] for exercising.    03/17/22 appt noted: Concerned about her car.   Cleaning up and clearing out things at home.  Will see chiropracter about a shoulder overuse issue. No med changes.  Consistent with meds.  No SE issues. Some visual issues resolved. Sleep is good generally.  When gets in a project then things go around in her mind.  Usually 9 hours sleep. Mood and anxiety are ok.   Still gets together with neighborhood ladies.    10 at the court the other day.  Seeing gkids. Has 6 scrap books.  Has largely quit doing so.    Past Psychiatric Medication Trials: Lamotrigine, Depakote, lithium, olanzapine 2.5 Buspar dizzy,  Benadryl HS helps sleep, mirtazapine 15, Trazodone,  Wellbutrin ,  NAC cut out bc NR noted and doesn't like so many pills.  melatonin    H died 64.    Review of Systems:  Review of Systems  HENT:  Positive for dental problem and voice change.   Cardiovascular:  Negative for palpitations.  Gastrointestinal:  Positive for diarrhea. Negative for nausea.  Musculoskeletal:  Positive for arthralgias and back pain.       Sees chiropracter  Skin:  Positive for rash.  Neurological:  Negative for dizziness, tremors and headaches.       Balance isn't great. No recent falls.  Psychiatric/Behavioral:  Negative for agitation, behavioral problems, confusion, decreased concentration, dysphoric mood, hallucinations, self-injury, sleep disturbance and suicidal ideas. The patient is not nervous/anxious and is not hyperactive.     Medications: I have reviewed the patient's current medications.  Current Outpatient Medications  Medication Sig Dispense Refill   Cholecalciferol 25 MCG (1000 UT) capsule Take 1 tablet by mouth daily.     estradiol (ESTRACE) 0.1 MG/GM vaginal cream Use 1/2 g vaginally two or three times per week as needed to maintain symptom relief. 42.5 g 1   levothyroxine (SYNTHROID) 88 MCG tablet TAKE 1 TABLET BY MOUTH ONCE DAILY BEFORE BREAKFAST 30 tablet 1   lidocaine (XYLOCAINE) 2 % jelly USE AS NEEDED FOR PAINFUL HEMORROIDS THREE TIMES DAILY 30 mL 1   loratadine (CLARITIN) 10 MG tablet Take 10 mg by mouth daily.     Melatonin 10 MG CAPS Take 1 tablet by mouth at bedtime.     omeprazole (PRILOSEC) 40 MG capsule Take 40 mg by mouth daily.  2   psyllium (METAMUCIL) 58.6 % packet Take by mouth.     QC PINK BISMUTH 262 MG chewable tablet      rosuvastatin (CRESTOR) 20 MG tablet Take 20 mg by  mouth daily at 12 noon.     Calcium Citrate (CITRACAL PO) Take by mouth 2 (two) times daily.  (  Patient not taking: Reported on 03/17/2022)     lithium carbonate 150 MG capsule Take one capsule by mouth every morning and two capsules at bedtime 270 capsule 1   LORazepam (ATIVAN) 0.5 MG tablet 1 tablet twice daily as needed for anxiety 20 tablet 0   mirtazapine (REMERON) 15 MG tablet Take 1 tablet (15 mg total) by mouth at bedtime. 90 tablet 1   No current facility-administered medications for this visit.    Medication Side Effects: None  Allergies:  Allergies  Allergen Reactions   Other Other (See Comments)    Allergic rhinitis symptoms     Past Medical History:  Diagnosis Date   Anxiety    Bipolar 1 disorder (HCC)    Breast mass, left    Cancer (HCC)    hx of skin cancer   Confusion    Depression    Eczema 2021   Fibroid    GERD (gastroesophageal reflux disease)    History of colon polyps    Hyperparathyroidism    Hypothyroidism    Leg lesion    Osteopenia    Restless leg    Thyroid disease    UTI (lower urinary tract infection)     Family History  Problem Relation Age of Onset   Alzheimer's disease Mother    Stroke Mother    Hypertension Mother    Diabetes Father    Alzheimer's disease Father    Cancer Father        colon   Breast cancer Sister     Social History   Socioeconomic History   Marital status: Widowed    Spouse name: Not on file   Number of children: Not on file   Years of education: Not on file   Highest education level: Not on file  Occupational History   Not on file  Tobacco Use   Smoking status: Never   Smokeless tobacco: Never  Vaping Use   Vaping Use: Never used  Substance and Sexual Activity   Alcohol use: Yes    Alcohol/week: 1.0 standard drink of alcohol    Types: 1 Glasses of wine per week   Drug use: No   Sexual activity: Not Currently    Partners: Male    Birth control/protection: Post-menopausal  Other Topics Concern    Not on file  Social History Narrative   Not on file   Social Determinants of Health   Financial Resource Strain: Not on file  Food Insecurity: Not on file  Transportation Needs: Not on file  Physical Activity: Not on file  Stress: Not on file  Social Connections: Not on file  Intimate Partner Violence: Not on file    Past Medical History, Surgical history, Social history, and Family history were reviewed and updated as appropriate.   Please see review of systems for further details on the patient's review from today.   Objective:   Physical Exam:  LMP 01/10/2005 (Approximate)   Physical Exam Constitutional:      General: She is not in acute distress. Musculoskeletal:        General: No deformity.  Neurological:     Mental Status: She is alert and oriented to person, place, and time.     Motor: No tremor.     Coordination: Coordination normal.     Gait: Gait normal.  Psychiatric:        Attention and Perception: Attention and perception normal. She does not perceive auditory or visual hallucinations.  Mood and Affect: Mood is not anxious or depressed. Affect is not labile or angry.        Speech: Speech normal. Speech is not rapid and pressured.        Behavior: Behavior normal. Behavior is not slowed.        Thought Content: Thought content normal. Thought content is not paranoid or delusional. Thought content does not include homicidal or suicidal ideation.        Cognition and Memory: Memory normal. Cognition is impaired.        Judgment: Judgment normal.     Comments: Neat. Insight and judgment fair. Mild forgetfulness and cognitive problems ongoing but stable and not worse over 2022-2023 Doesnot appear to be impairing.      Lab Review:     Component Value Date/Time   NA 142 07/16/2019 1453   K 3.9 07/16/2019 1453   CL 106 07/16/2019 1453   CO2 30 07/16/2019 1453   GLUCOSE 116 07/16/2019 1453   BUN 11 07/16/2019 1453   CREATININE 0.67 07/16/2019  1453   CALCIUM 10.9 (H) 07/16/2019 1453   GFRNONAA >90 09/11/2012 1330   GFRAA >90 09/11/2012 1330       Component Value Date/Time   WBC 8.3 09/11/2012 1330   RBC 4.45 09/11/2012 1330   HGB 13.7 09/11/2012 1330   HCT 42.4 09/11/2012 1330   PLT 186 09/11/2012 1330   MCV 95.3 09/11/2012 1330   MCH 30.8 09/11/2012 1330   MCHC 32.3 09/11/2012 1330   RDW 13.4 09/11/2012 1330   LYMPHSABS 1.1 09/11/2012 1330   MONOABS 0.6 09/11/2012 1330   EOSABS 0.1 09/11/2012 1330   BASOSABS 0.0 09/11/2012 1330    Lithium Lvl  Date Value Ref Range Status  07/16/2019 0.5 (L) 0.6 - 1.2 mmol/L Final    Component 12/15/20 11/02/20 06/30/20 03/09/20 06/12/19 11/17/16  Lithium Level 0.5 Low   0.5 Low   0.6  0.6 0.6 0.6   Component 12/15/20 11/02/20 06/30/20 03/09/20 10/14/19 06/12/19  TSH 0.27 Low   0.06 Low   0.95  2.550  0.608  2.794    11/02/2020 calcium 10.8, creatinine 0.62, vitamin Amy [name] 97 on 1000 U daily at [place]  06/21/21 lithium 0.6 11/22/21 lithium 0.5 stable., normal Amy [name], TSH, CMP, normal Cr and Calcium 10.4  No results found for: "PHENYTOIN", "PHENOBARB", "VALPROATE", "CBMZ"   Bone density test osteoporosis  Hips and spine.  .res Assessment: Plan:    Bipolar II disorder ([place]) - Plan: lithium carbonate 150 MG capsule  Generalized anxiety disorder - Plan: LORazepam (ATIVAN) 0.5 MG tablet, mirtazapine (REMERON) 15 MG tablet  Insomnia due to mental condition - Plan: mirtazapine (REMERON) 15 MG tablet  Lithium use - Plan: lithium carbonate 150 MG capsule    Greater than 50% of  30 min face to face time with patient was spent on counseling and coordination of care. Counseled patient regarding potential benefits, risks, and side effects of lithium to include potential risk of lithium affecting thyroid and renal functon.  Discussed need for periodic lab monitoring to determine drug level and to assess for potential adverse effects.  Counseled patient regarding signs and  symptoms of lithium toxicity and advised that they notify office immediately or seek urgent medical attention if experiencing these signs and symptoms.  Patient advised to contact office with any questions or concerns.  She has a history of hyperparathyroidism and is aware that lithium can increase calcium levels.  At her age it would  be very risky to attempt to find an alternative mood stabilizer when lithium has been effective.  Alternatives would expose her to different types of side effects such as those with the atypical antipsychotics including tardive dyskinesia.  Overall mood has been very stable over the last couple of years.    Borderline mild cognitive impairment but she is able to care for herself adequately. Episodically worse without known triggers.  But appears stable with no worsening over this 2022. May be better off BZ .  No evidence of impairment.   She feels her mood and anxiety and sleep are pretty stable except as noted.  She has been on lithium for many years with good response.  Specifically stopping lithium which has helped her for decades would be highly risky for significant mood destabilization which could last for months and expose her to risks of atypical antipsychotics which would be the alternative to lithium.  She's already failed alternative of Depakote. She doesn't want to change the lithium.  Overall doing well despite very low lithium levels.  Med sensitve  Insomnia is managed with medications at [facility].  Some possible limited sx panic with episodes of SOB and hyperventilation and brreathing in brown bag.  But this is rare.    Disc cognitive risks with OTC sleep aids.  Keep them at a minimum if possible.     Sleep better with mirtazapine 15 mg HS.  Encourage continued exercise including balance and yoga.   No med changes indicated:  Continue lithium 150 mg every morning and 300 mg nightly, Labs stable 6/23 including normal BMP mirtazapine 15 mg nightly. on a  benzodiazepine prn for brief panic like sx.    No evidence of abuse/misuse. Rare lorazepam 0.5 mg prn anxiety.  Repeat lithium level every 6 mos bc is on low dose with low level.  As noted it is stable. Extensive discussion of various labs.  Followed closely by internal med and endo.  Is doing well with labs.  FU 4-6  mos  Lynder Parents, Amy [name], DFAPA   Please see After Visit Summary for patient specific instructions.  Future Appointments  Date Time Provider [place]  09/21/2022 11:00 AM Yisroel Ramming, Everardo All, Amy [name] [facility] None      No orders of the defined types were placed in this encounter.      -------------------------------

## 2022-05-11 DIAGNOSIS — C44319 Basal cell carcinoma of skin of other parts of face: Secondary | ICD-10-CM | POA: Diagnosis not present

## 2022-05-11 DIAGNOSIS — L821 Other seborrheic keratosis: Secondary | ICD-10-CM | POA: Diagnosis not present

## 2022-05-11 DIAGNOSIS — D485 Neoplasm of uncertain behavior of skin: Secondary | ICD-10-CM | POA: Diagnosis not present

## 2022-05-11 DIAGNOSIS — L570 Actinic keratosis: Secondary | ICD-10-CM | POA: Diagnosis not present

## 2022-05-20 DIAGNOSIS — Z9889 Other specified postprocedural states: Secondary | ICD-10-CM | POA: Diagnosis not present

## 2022-05-20 DIAGNOSIS — Z5181 Encounter for therapeutic drug level monitoring: Secondary | ICD-10-CM | POA: Diagnosis not present

## 2022-05-20 DIAGNOSIS — F319 Bipolar disorder, unspecified: Secondary | ICD-10-CM | POA: Diagnosis not present

## 2022-05-20 DIAGNOSIS — J3801 Paralysis of vocal cords and larynx, unilateral: Secondary | ICD-10-CM | POA: Diagnosis not present

## 2022-05-20 DIAGNOSIS — R1310 Dysphagia, unspecified: Secondary | ICD-10-CM | POA: Diagnosis not present

## 2022-05-20 DIAGNOSIS — R7309 Other abnormal glucose: Secondary | ICD-10-CM | POA: Diagnosis not present

## 2022-05-20 DIAGNOSIS — E210 Primary hyperparathyroidism: Secondary | ICD-10-CM | POA: Diagnosis not present

## 2022-05-20 DIAGNOSIS — M810 Age-related osteoporosis without current pathological fracture: Secondary | ICD-10-CM | POA: Diagnosis not present

## 2022-05-20 DIAGNOSIS — E890 Postprocedural hypothyroidism: Secondary | ICD-10-CM | POA: Diagnosis not present

## 2022-05-20 DIAGNOSIS — R599 Enlarged lymph nodes, unspecified: Secondary | ICD-10-CM | POA: Diagnosis not present

## 2022-05-20 DIAGNOSIS — M7989 Other specified soft tissue disorders: Secondary | ICD-10-CM | POA: Diagnosis not present

## 2022-06-14 DIAGNOSIS — R2232 Localized swelling, mass and lump, left upper limb: Secondary | ICD-10-CM | POA: Diagnosis not present

## 2022-06-14 DIAGNOSIS — R599 Enlarged lymph nodes, unspecified: Secondary | ICD-10-CM | POA: Diagnosis not present

## 2022-06-14 DIAGNOSIS — M7989 Other specified soft tissue disorders: Secondary | ICD-10-CM | POA: Diagnosis not present

## 2022-06-14 DIAGNOSIS — R2231 Localized swelling, mass and lump, right upper limb: Secondary | ICD-10-CM | POA: Diagnosis not present

## 2022-06-16 ENCOUNTER — Other Ambulatory Visit: Payer: Self-pay | Admitting: Psychiatry

## 2022-06-16 DIAGNOSIS — F411 Generalized anxiety disorder: Secondary | ICD-10-CM

## 2022-06-21 DIAGNOSIS — C44319 Basal cell carcinoma of skin of other parts of face: Secondary | ICD-10-CM | POA: Diagnosis not present

## 2022-07-10 DIAGNOSIS — L309 Dermatitis, unspecified: Secondary | ICD-10-CM | POA: Diagnosis not present

## 2022-07-13 DIAGNOSIS — L309 Dermatitis, unspecified: Secondary | ICD-10-CM | POA: Diagnosis not present

## 2022-07-13 DIAGNOSIS — Z5189 Encounter for other specified aftercare: Secondary | ICD-10-CM | POA: Diagnosis not present

## 2022-07-20 ENCOUNTER — Ambulatory Visit (INDEPENDENT_AMBULATORY_CARE_PROVIDER_SITE_OTHER): Payer: Medicare Other | Admitting: Psychiatry

## 2022-07-20 ENCOUNTER — Encounter: Payer: Self-pay | Admitting: Psychiatry

## 2022-07-20 DIAGNOSIS — F3181 Bipolar II disorder: Secondary | ICD-10-CM | POA: Diagnosis not present

## 2022-07-20 DIAGNOSIS — Z79899 Other long term (current) drug therapy: Secondary | ICD-10-CM

## 2022-07-20 DIAGNOSIS — F5105 Insomnia due to other mental disorder: Secondary | ICD-10-CM | POA: Diagnosis not present

## 2022-07-20 DIAGNOSIS — F411 Generalized anxiety disorder: Secondary | ICD-10-CM

## 2022-07-20 NOTE — Progress Notes (Signed)
Amy [name] [id number] 19-May-1945 77 y.o.   Subjective:   [name] is a 77 y.o. (DOB [birth year]) female.  Chief Complaint:  Chief Complaint  Patient presents with   Follow-up    Mood and anxiety    Anxiety Patient reports no confusion, decreased concentration, dizziness, nausea, nervous/anxious behavior, palpitations or suicidal ideas.     Amy [name] presents to the office today for follow-up of mood, anxiety and STM problems.    seen November 2020.  The following was noted: She moved to this appointment earlier. Amy [name] in on the appt Amy [name]. Not feeling well for over a week.  Staying with Amy [name] for a week.   Problems with diarrhea.  Using immodium. More tremor and confusion, poor memory, and poor handwriting.  Amy [name] notes sleeping a lot and is cold.   Has stayed well re: viral illness.  Has been able to still meet with 8 neighbors and that has continued to help her mental health.   Notices more trouble with word-finding and fluency and that bothers her.   Things better lately with Amy [name], this has been the chronic stressor for years.  Doesn't know why but pleased.  Disc other stressors with a recent friend when she tried to reconcile with him and he refused. No meds were changed.  05/10/2019 appointment, the following is noted: Disc lithium level variations from November 0.7 to April 0.4 with one in between of 1.7 at [name] office without any changes in dosages.  No explanation for that change. Had been dealing with diarrhea but Dr. [name] changed med added Florastor which helped.  Worries about the cat not sleeping with her.   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  Struggling to learn a new phone.  Can do Facebook. No med change.  08/05/19 appt with the following noted: Up and down.  Recent period of confusion.  Had a problem with one of neighbors. Asked for daughter's help and she came by to help. No problems driving.  Word finding issues.  Does her own shopping and pays bills. She went for follow up to labs July 6 and did not have UTI. Not anxious since being more social.  Not as easily upset. On same doses of lorazepam 0.5 mg HS, mirtazapine 7.5 mg HS, lithium 150 mg 1 cap AM and 2 HS. Some awakening hungry at night lately. Plan: no med changes  11/27/19 appt with following noted: Problems with GI vomiting diarrhea, skin problems with med changes.  Skin problem is better some with topicals.  N and diarrhea might be stress related bc is better now that keeps her distance from stressful neighbor.   They wondered about her stopping all meds to reevaluate. On same doses of lorazepam 0.5 mg HS, mirtazapine 7.5 mg HS, lithium 150 mg 1 cap AM and 2 HS. Sleep is good with meds.    Plan no med changes but check labs  03/11/2020 appointment with the following noted: Labs were checked and noted.  FU endocrinology tomorrow  No SE with meds. Still walking with other women. Covid and weather limit mood.  Scrapbooking helps.  GS visits and helps her with things. Needs colonoscopy.  Hip pain. Less confusion.  But can be a little forgetful.  Still can manage her own activities. Doesn't like to cook.  Likes her apt.  Plan: no med changes  06/10/20 appt with following noted: Neighbor group has split up.  New resident came in and split things up.  Colonoscopy pending for August bc concerns  Raised at PE. Sleep not as good.  Problems with neighbors bothers her sleep.  Delayed sleep phase  Went to Movie No SE with meds Plan: Disc alternative sleeper.  First try increase mirtazapine to 15 mg HS to see if sleep better. Disc alternative sleeper.  First try increase mirtazapine to 15 mg HS to see if sleep better.   09/15/2020 appointment with the following noted: Patient has had some confusion around how to take the mirtazapine.  At times she has had 15 mg tablets and other x7.5 mg tablets and apparently has taken up to 30 mg. Been behaving and  staying out of trouble.  Saw family last weekend and had a good time. Has been taking mirtazapine 15 as 2 of the 7.5 mg tablets and sleeping well.   Lost medical doctors, [name] and [name]. Plan: Check lithium level No med changes  12/16/2020 appointment with the following noted: Disc medical visits. Less socialization DT weather.  But is walking with 2 other ladies. Hopes for frequent visits with family.  Not as regular visits as she would like but did see them at Thanksgiving.  Lonely right now. Ongoing GI problems and plans to see a doctor Plan: No med changes Continue lithium 150 mg every morning and 300 mg nightly, mirtazapine 15 mg nightly. Not on a benzodiazepine at this time  03/17/2021 appointment with the following noted: Personally doing very well.  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 Was keeping them but now it's intermittent.  Health is OK lately. Not walking as much DT weather. Naps some.   Once or twice a month gets sx of hyperventilation and breathes into a brown bag until it gets better.  Thinks it's the [name].  Occ will wake her up. Doesn't cook much.  But buys prepared foods at grocery store. Is gathering outside sometimes with 6-10 ladies. Patient reports stable mood and denies depressed or irritable moods.  Patient has some chronic difficulty with anxiety situationally but not worse.  Patient denies difficulty with sleep initiation or maintenance usually.  Takes OTC sleep aid and needs it to sleep.  No hangover. Denies appetite disturbance.  Patient reports that energy and motivation have been good.  Patient worsening difficulty with concentration and memory.  Gets times confused.  No missed appts..  Patient denies any suicidal ideation. Saw Dr. [name] in January and the labs and he had no concerns about her labs.  Normal EKG  06/14/21 appt noted: Weight stable 129#. Argument with lady at circle on Friday.  Upsetting.  Pt says she will not go to the circle  for awhile.  Has another women's social group.  Group is gossiping. 77 yo GD having some mental problems.  Disc this. Sleep well. And some napping. Not sig depressed.  Tolerating meds. [name] is aggravating. [name] retired.  09/16/21 appt noted: Behaving myself.   Friends having health problems worrisome.  Still friends at condo division.  There's fox bothering their cars. Digging up their area. Doing well with meds.  No SE problems. Does have exzema right now but derm doesn't want to RX meds for it. Mood has been ok.   GD is SR in HS and the family is doing better.  Plays golf well.   Gkids KG, 8th grade, 10th grade and SR in HS Amy [name] tends to still call infrequently and "I just go with the flow". Added melatonin and sleep is better. New doctor Janee Morn  said he'd do lithium levels. Plan:  No med changes Continue lithium 150 mg every morning and 300 mg nightly,  mirtazapine 15 mg nightly. Not on a benzodiazepine at this time Labs stable 6/23 including normal BMP   12/15/21 appt noted: Mood often determined by Amy [name]'s mood and she is in a great mood right now.  They spontaneously visited recently.  They helped her fix some things.   Favorite GS voted most likely to make you smile in his class.  So her mood is good.   Would llike some lorazepam prn for anxiety.  About every other week without trigger gets anxious with SOB and needs lorazepam.  Those episodes last about a minute or so.  Some of the neighbors can be nasty and generates stress. Goes to [employer] for exercising.    03/17/22 appt noted: Concerned about her car.   Cleaning up and clearing out things at home.  Will see chiropracter about a shoulder overuse issue. No med changes.  Consistent with meds.  No SE issues. Some visual issues resolved. Sleep is good generally.  When gets in a project then things go around in her mind.  Usually 9 hours sleep. Mood and anxiety are ok.   Still gets together with neighborhood ladies.    10 at the court the other day.  Seeing gkids. Has 90 scrap books.  Has largely quit doing so.   Plan: no changes  07/20/22 appt noted: Psych med: lorazepam 0.5 mg HS prn , mirtazapine 15 mg HS, lithium 150 daily.   Continues stress with Amy [name] intermittently attentive.  Hard to read her.  Tries to be pleasant with her. Still social with neighbor ladies.  Friend with broken hip and she helped her get to the doctor. She helped her at the hospital and got her worn out.   Too hot to go out and socialize.  No SE with any of the meds.   No med concerns.   Usually sleep ok without lorazepam. Enrolled in exercise program but not as effective as it should be bc friends moved away that she went with.   Overall mood is OK.    New endo Dr. Janee [facility]orn, [facility].    Past Psychiatric Medication Trials: Lamotrigine, Depakote, lithium, olanzapine 2.5 Buspar dizzy,  Benadryl HS helps sleep, mirtazapine 15, Trazodone,  Wellbutrin ,  NAC cut out bc NR noted and doesn't like so many pills.  melatonin    H died 64.    Review of Systems:  Review of Systems  HENT:  Positive for dental problem and voice change.   Cardiovascular:  Negative for palpitations.  Gastrointestinal:  Positive for diarrhea. Negative for nausea.  Musculoskeletal:  Positive for arthralgias and back pain.       Sees chiropracter  Skin:  Positive for rash.  Neurological:  Negative for dizziness, tremors, weakness and headaches.       Balance isn't great. No recent falls.  Psychiatric/Behavioral:  Negative for agitation, behavioral problems, confusion, decreased concentration, dysphoric mood, hallucinations, self-injury, sleep disturbance and suicidal ideas. The patient is not nervous/anxious and is not hyperactive.     Medications: I have reviewed the patient's current medications.  Current Outpatient Medications  Medication Sig Dispense Refill   Calcium Citrate (CITRACAL PO) Take by mouth 2 (two) times daily.     Cholecalciferol 25  MCG (1000 UT) capsule Take 1 tablet by mouth daily.     estradiol (ESTRACE) 0.1 MG/GM vaginal cream Use 1/2 g vaginally two or  three times per week as needed to maintain symptom relief. 42.5 g 1   levothyroxine (SYNTHROID) 88 MCG tablet TAKE 1 TABLET BY MOUTH ONCE DAILY BEFORE BREAKFAST 30 tablet 1   lidocaine (XYLOCAINE) 2 % jelly USE AS NEEDED FOR PAINFUL HEMORROIDS THREE TIMES DAILY 30 mL 1   lithium carbonate 150 MG capsule Take one capsule by mouth every morning and two capsules at bedtime 270 capsule 1   loratadine (CLARITIN) 10 MG tablet Take 10 mg by mouth daily.     LORazepam (ATIVAN) 0.5 MG tablet Take 1 tablet by mouth twice daily as needed for anxiety 20 tablet 1   Melatonin 10 MG CAPS Take 1 tablet by mouth at bedtime.     mirtazapine (REMERON) 15 MG tablet Take 1 tablet (15 mg total) by mouth at bedtime. 90 tablet 1   omeprazole (PRILOSEC) 40 MG capsule Take 40 mg by mouth daily.  2   psyllium (METAMUCIL) 58.6 % packet Take by mouth.     QC PINK BISMUTH 262 MG chewable tablet      rosuvastatin (CRESTOR) 20 MG tablet Take 20 mg by mouth daily at 12 noon. Taking once weekly     No current facility-administered medications for this visit.    Medication Side Effects: None  Allergies:  Allergies  Allergen Reactions   Other Other (See Comments)    Allergic rhinitis symptoms     Past Medical History:  Diagnosis Date   Anxiety    Bipolar 1 disorder (HCC)    Breast mass, left    Cancer (HCC)    hx of skin cancer   Confusion    Depression    Eczema 2021   Fibroid    GERD (gastroesophageal reflux disease)    History of colon polyps    Hyperparathyroidism    Hypothyroidism    Leg lesion    Osteopenia    Restless leg    Thyroid disease    UTI (lower urinary tract infection)     Family History  Problem Relation Age of Onset   Alzheimer's disease Mother    Stroke Mother    Hypertension Mother    Diabetes Father    Alzheimer's disease Father    Cancer Father         colon   Breast cancer Sister     Social History   Socioeconomic History   Marital status: Widowed    Spouse name: Not on file   Number of children: Not on file   Years of education: Not on file   Highest education level: Not on file  Occupational History   Not on file  Tobacco Use   Smoking status: Never   Smokeless tobacco: Never  Vaping Use   Vaping Use: Never used  Substance and Sexual Activity   Alcohol use: Yes    Alcohol/week: 1.0 standard drink of alcohol    Types: 1 Glasses of wine per week   Drug use: No   Sexual activity: Not Currently    Partners: Male    Birth control/protection: Post-menopausal  Other Topics Concern   Not on file  Social History Narrative   Not on file   Social Determinants of Health   Financial Resource Strain: Not on file  Food Insecurity: Not on file  Transportation Needs: Not on file  Physical Activity: Not on file  Stress: Not on file  Social Connections: Not on file  Intimate Partner Violence: Not on file    Past Medical History,  Surgical history, Social history, and Family history were reviewed and updated as appropriate.   Please see review of systems for further details on the patient's review from today.   Objective:   Physical Exam:  LMP 01/10/2005 (Approximate)   Physical Exam Constitutional:      General: She is not in acute distress. Musculoskeletal:        General: No deformity.  Neurological:     Mental Status: She is alert and oriented to person, place, and time.     Motor: No tremor.     Coordination: Coordination normal.     Gait: Gait normal.  Psychiatric:        Attention and Perception: Attention and perception normal. She does not perceive auditory or visual hallucinations.        Mood and Affect: Mood is not anxious or depressed. Affect is not labile or angry.        Speech: Speech normal. Speech is not rapid and pressured.        Behavior: Behavior normal. Behavior is not slowed.        Thought  Content: Thought content normal. Thought content is not paranoid or delusional. Thought content does not include homicidal or suicidal ideation.        Cognition and Memory: Memory normal. Cognition is impaired.        Judgment: Judgment normal.     Comments: Neat. Insight and judgment fair. Mild forgetfulness and cognitive problems ongoing but stable and not worse over 2022-2024 Doesnot appear to be impairing.      Lab Review:     Component Value Date/Time   NA 142 07/16/2019 1453   K 3.9 07/16/2019 1453   CL 106 07/16/2019 1453   CO2 30 07/16/2019 1453   GLUCOSE 116 07/16/2019 1453   BUN 11 07/16/2019 1453   CREATININE 0.67 07/16/2019 1453   CALCIUM 10.9 (H) 07/16/2019 1453   GFRNONAA >90 09/11/2012 1330   GFRAA >90 09/11/2012 1330       Component Value Date/Time   WBC 8.3 09/11/2012 1330   RBC 4.45 09/11/2012 1330   HGB 13.7 09/11/2012 1330   HCT 42.4 09/11/2012 1330   PLT 186 09/11/2012 1330   MCV 95.3 09/11/2012 1330   MCH 30.8 09/11/2012 1330   MCHC 32.3 09/11/2012 1330   RDW 13.4 09/11/2012 1330   LYMPHSABS 1.1 09/11/2012 1330   MONOABS 0.6 09/11/2012 1330   EOSABS 0.1 09/11/2012 1330   BASOSABS 0.0 09/11/2012 1330    Lithium Lvl  Date Value Ref Range Status  07/16/2019 0.5 (L) 0.6 - 1.2 mmol/L Final    Component 12/15/20 11/02/20 06/30/20 03/09/20 06/12/19 11/17/16  Lithium Level 0.5 Low   0.5 Low   0.6  0.6 0.6 0.6   Component 12/15/20 11/02/20 06/30/20 03/09/20 10/14/19 06/12/19  TSH 0.27 Low   0.06 Low   0.95  2.550  0.608  2.794    11/02/2020 calcium 10.8, creatinine 0.62, vitamin Amy [name] 97 on 1000 U daily at [facility]  06/21/21 lithium 0.6 11/22/21 lithium 0.5 stable., normal Amy [name], TSH, CMP, normal Cr and Calcium 10.4  05/20/22 lithium 0.5 mg stable low dose.  No results found for: "PHENYTOIN", "PHENOBARB", "VALPROATE", "CBMZ"   Bone density test osteoporosis  Hips and spine.  .res Assessment: Plan:    Bipolar II disorder  (HCC)  Generalized anxiety disorder  Insomnia due to mental condition  Lithium use    30 min face to face time with patient was  spent on counseling and coordination of care. Counseled patient regarding potential benefits, risks, and side effects of lithium to include potential risk of lithium affecting thyroid and renal functon.  Discussed need for periodic lab monitoring to determine drug level and to assess for potential adverse effects.  Counseled patient regarding signs and symptoms of lithium toxicity and advised that they notify office immediately or seek urgent medical attention if experiencing these signs and symptoms.  Patient advised to contact office with any questions or concerns.  She has a history of hyperparathyroidism and is aware that lithium can increase calcium levels.  At her age it would be very risky to attempt to find an alternative mood stabilizer when lithium has been effective.  Alternatives would expose her to different types of side effects such as those with the atypical antipsychotics including tardive dyskinesia. Does well with low dose lithium.  Overall mood has been very stable over the last couple of years.    Borderline mild cognitive impairment but she is able to care for herself adequately. Episodically worse without known triggers.  But appears stable with no worsening over this 2022. May be better off routine BZ .  No evidence of impairment.   She feels her mood and anxiety and sleep are pretty stable.  She has been on lithium for many years with good response.  Specifically stopping lithium which has helped her for decades would be highly risky for significant mood destabilization which could last for months and expose her to risks of atypical antipsychotics which would be the alternative to lithium.  She's already failed alternative of Depakote. She doesn't want to change the lithium.  Overall doing well despite very low lithium levels.  Med  sensitve  Insomnia is managed with medications at [facility].  Anxiety resolved.   Disc cognitive risks with OTC sleep aids.  Keep them at a minimum if possible.     Sleep better with mirtazapine 15 mg HS.  Encourage continued exercise including balance and yoga.  No med changes indicated, more stable lately.   Continue lithium 150 mg every morning and 300 mg nightly, Labs stable 6/23 including normal BMP mirtazapine 15 mg nightly. on a benzodiazepine prn for brief panic like sx.    No evidence of abuse/misuse. Rare lorazepam 0.5 mg prn anxiety.  Repeat lithium level every 6 mos bc is on low dose with low level.  As noted it is stable. Extensive discussion of various labs.  Followed closely by internal med and endo.  Is doing well with labs.  Level in May 2024 is stable.   She has FU with labs pending in August.  Dr. Janee Morn.    FU 4  mos.  She is uncomfortable coming less often.  Meredith Staggers, MD, DFAPA   Please see After Visit Summary for patient specific instructions.  Future Appointments  Date Time Provider Department Center  09/21/2022 11:00 AM Ardell Isaacs, Forrestine Him, MD [facility] None      No orders of the defined types were placed in this encounter.      -------------------------------

## 2022-08-15 ENCOUNTER — Other Ambulatory Visit: Payer: Self-pay

## 2022-08-15 ENCOUNTER — Telehealth: Payer: Self-pay | Admitting: Psychiatry

## 2022-08-15 DIAGNOSIS — F411 Generalized anxiety disorder: Secondary | ICD-10-CM

## 2022-08-15 MED ORDER — LORAZEPAM 0.5 MG PO TABS: ORAL_TABLET | ORAL | 1 refills | Status: DC

## 2022-08-15 NOTE — Telephone Encounter (Signed)
Pt LVM @ 9:57a asking for refill of Lorazepam to the [facility].  She didn't specify which one and when I tried to call her back, it says the mailbox is not set up, so I'm assuming it's the location where it was last prescribed.   [facility] [address] [place], [place] - [address], [place] [address] Phone: [phone]  Fax: [phone]    Next appt 10/10

## 2022-08-15 NOTE — Telephone Encounter (Signed)
Pended.

## 2022-08-24 DIAGNOSIS — F319 Bipolar disorder, unspecified: Secondary | ICD-10-CM | POA: Diagnosis not present

## 2022-08-24 DIAGNOSIS — E039 Hypothyroidism, unspecified: Secondary | ICD-10-CM | POA: Diagnosis not present

## 2022-08-24 DIAGNOSIS — E785 Hyperlipidemia, unspecified: Secondary | ICD-10-CM | POA: Diagnosis not present

## 2022-08-24 DIAGNOSIS — I10 Essential (primary) hypertension: Secondary | ICD-10-CM | POA: Diagnosis not present

## 2022-09-07 NOTE — Progress Notes (Signed)
77 y.o. G2P2 Widowed Caucasian female here for breast and pelvic exam.   She is followed for vaginal atrophy. Requests refill of vaginal estradiol cream.  Takes Metamucil and stool is hard.  She strains to have a bowel movement, and she then has rectal bleeding. Asking about alternatives to Metamucil.  Wearing pads daily due to urinary incontinence.  She leaks with coughing. No urgency or leakage for no reason.  Voids more during the day if standing a lot.  Up once a night to void.  Not sexually active since her husband died many years ago.  PCP:  Dr. Jacky Kindle  Endocrinologist:  Dr. Diamantina Monks   Patient's last menstrual period was 01/10/2005 (approximate).           Sexually active: No.  The current method of family planning is post menopausal status.    Exercising: Yes.     [facility] Smoker:  no  Health Maintenance: Pap:  09/09/20 neg, 06/28/18 neg History of abnormal Pap:  no MMG: due 2024, does not know if it was done, 07/28/21 Breast Density B, BI-RADS CAT 4 sus.  Bx ductal hyperplasia and fibrocystic change. Colonoscopy:  02/2017 polyps BMD:   11/13/19  Result  osteoporosis - sees endocrinology. TDaP:  2016 Gardasil:   no HIV: n/a Hep C: n/a Screening Labs:  PCP/endocrinology.   reports that she has never smoked. She has never used smokeless tobacco. She reports that she does not currently use alcohol after a past usage of about 1.0 standard drink of alcohol per week. She reports that she does not use drugs.  Past Medical History:  Diagnosis Date   Anxiety    Bipolar 1 disorder (HCC)    Breast mass, left    Cancer (HCC)    hx of skin cancer   Confusion    Depression    Eczema 2021   Fibroid    GERD (gastroesophageal reflux disease)    History of colon polyps    Hyperparathyroidism    Hypothyroidism    Leg lesion    Osteopenia    Restless leg    Thyroid disease    UTI (lower urinary tract infection)     Past Surgical History:  Procedure Laterality  Date   BREAST LUMPECTOMY WITH RADIOACTIVE SEED LOCALIZATION Left 12/28/2015   Procedure: LEFT BREAST LUMPECTOMY WITH RADIOACTIVE SEED LOCALIZATION;  Surgeon: Abigail Miyamoto, MD;  Location: [facility];  Service: General;  Laterality: Left;   BREAST SURGERY     breast biopsy (Rt)   DILATION AND CURETTAGE OF UTERUS     HYSTEROSCOPY WITH D & C  2006   postmenopausal bleeding, endometrial polyp   LIPOSUCTION TRUNK     and placed fat tissue in vocal cords   MOHS SURGERY  2024   SKIN CANCER EXCISION     hands and thigh   THYROID SURGERY  2011, 2004   Para Thyroid   TONSILLECTOMY      Current Outpatient Medications  Medication Sig Dispense Refill   Calcium Citrate (CITRACAL PO) Take by mouth 2 (two) times daily.     Cholecalciferol 25 MCG (1000 UT) capsule Take 1 tablet by mouth daily.     estradiol (ESTRACE) 0.1 MG/GM vaginal cream Use 1/2 g vaginally two or three times per week as needed to maintain symptom relief. 42.5 g 1   levothyroxine (SYNTHROID) 88 MCG tablet TAKE 1 TABLET BY MOUTH ONCE DAILY BEFORE BREAKFAST 30 tablet 1   lidocaine (XYLOCAINE) 2 % jelly USE  AS NEEDED FOR PAINFUL HEMORROIDS THREE TIMES DAILY 30 mL 1   lithium carbonate 150 MG capsule Take one capsule by mouth every morning and two capsules at bedtime 270 capsule 1   loratadine (CLARITIN) 10 MG tablet Take 10 mg by mouth daily.     LORazepam (ATIVAN) 0.5 MG tablet Take 1 tablet by mouth twice daily as needed for anxiety 20 tablet 1   Melatonin 10 MG CAPS Take 1 tablet by mouth at bedtime.     mirtazapine (REMERON) 15 MG tablet Take 1 tablet (15 mg total) by mouth at bedtime. 90 tablet 1   omeprazole (PRILOSEC) 40 MG capsule Take 40 mg by mouth daily.  2   psyllium (METAMUCIL) 58.6 % packet Take by mouth.     QC PINK BISMUTH 262 MG chewable tablet      rosuvastatin (CRESTOR) 20 MG tablet Take 20 mg by mouth daily at 12 noon. Taking once weekly     No current facility-administered medications for this  visit.    Family History  Problem Relation Age of Onset   Alzheimer's disease Mother    Stroke Mother    Hypertension Mother    Diabetes Father    Alzheimer's disease Father    Cancer Father        colon   Breast cancer Sister     Review of Systems  All other systems reviewed and are negative.   Exam:   BP 136/82 (BP Location: Left Arm, Patient Position: Sitting, Cuff Size: Normal)   Pulse 79   Ht 5\' 6"  (1.676 m)   Wt 128 lb (58.1 kg)   LMP 01/10/2005 (Approximate)   SpO2 95%   BMI 20.66 kg/m     General appearance: alert, cooperative and appears stated age Head: normocephalic, without obvious abnormality, atraumatic Neck: no adenopathy, supple, symmetrical, trachea midline and thyroid normal to inspection and palpation Lungs: clear to auscultation bilaterally Breasts: normal appearance, no masses or tenderness, No nipple retraction or dimpling, No nipple discharge or bleeding, No axillary adenopathy Heart: regular rate and rhythm Abdomen: soft, non-tender; no masses, no organomegaly Extremities: extremities normal, atraumatic, no cyanosis or edema Skin: skin color, texture, turgor normal. No rashes or lesions Lymph nodes: cervical, supraclavicular, and axillary nodes normal. Neurologic: grossly normal  Pelvic: External genitalia:  no lesions              No abnormal inguinal nodes palpated.              Urethra:  normal appearing urethra with no masses, tenderness or lesions              Bartholins and Skenes: normal                 Vagina: normal appearing vagina with normal color and discharge, no lesions              Cervix: no lesions              Pap taken: no Bimanual Exam:  Uterus:  normal size, contour, position, consistency, mobility, non-tender              Adnexa: no mass, fullness, tenderness              Rectal exam: yes.  Confirms.              Anus:  normal sphincter tone, no lesions  Chaperone was present for exam:  Warren Lacy, CMA  Assessment:    Well  woman visit with gynecologic exam. Vaginal atrophy.  Encounter for mediation monitoring. Stress urinary incontinence.   Constipation.  Hx left breast mass.  Benign biopsy.  FH of twin sister with breast cancer.  FH of colon cancer.  Osteoporosis.  Endocrinology managing. Hyperparathyroidism.   Plan: Mammogram screening discussed.  She will update at [facility].  Self breast awareness reviewed. Pap and HR HPV not needed. Guidelines for Calcium, Vitamin D, regular exercise program including cardiovascular and weight bearing exercise. She declined pelvic floor therapy, pessary, or surgical treatment.  Refill of vaginal estrogen cream.   I discussed potential effect on breast cancer.  We reviewed Miralax and Senna S as OTC treatment options for constipation.  Follow up annually and prn.   After visit summary provided.   30 min  total time was spent for this patient encounter, including preparation, face-to-face counseling with the patient, coordination of care, and documentation of the encounter in addition to doing the breast and pelvic exam.

## 2022-09-14 DIAGNOSIS — Z23 Encounter for immunization: Secondary | ICD-10-CM | POA: Diagnosis not present

## 2022-09-21 ENCOUNTER — Ambulatory Visit (INDEPENDENT_AMBULATORY_CARE_PROVIDER_SITE_OTHER): Payer: Medicare Other | Admitting: Obstetrics and Gynecology

## 2022-09-21 ENCOUNTER — Encounter: Payer: Self-pay | Admitting: Obstetrics and Gynecology

## 2022-09-21 VITALS — BP 136/82 | HR 79 | Ht 66.0 in | Wt 128.0 lb

## 2022-09-21 DIAGNOSIS — Z5181 Encounter for therapeutic drug level monitoring: Secondary | ICD-10-CM | POA: Diagnosis not present

## 2022-09-21 DIAGNOSIS — K5900 Constipation, unspecified: Secondary | ICD-10-CM | POA: Diagnosis not present

## 2022-09-21 DIAGNOSIS — Z01419 Encounter for gynecological examination (general) (routine) without abnormal findings: Secondary | ICD-10-CM

## 2022-09-21 DIAGNOSIS — N952 Postmenopausal atrophic vaginitis: Secondary | ICD-10-CM

## 2022-09-21 DIAGNOSIS — N393 Stress incontinence (female) (male): Secondary | ICD-10-CM | POA: Diagnosis not present

## 2022-09-21 MED ORDER — ESTRADIOL 0.1 MG/GM VA CREA: TOPICAL_CREAM | VAGINAL | 0 refills | Status: DC

## 2022-09-21 NOTE — Patient Instructions (Addendum)
Try Miralax and Senna S for constipation.  Contact [facility] to schedule mammogram for 2024.  EXERCISE AND DIET:  We recommended that you start or continue a regular exercise program for good health. Regular exercise means any activity that makes your heart beat faster and makes you sweat.  We recommend exercising at least 30 minutes per day at least 3 days a week, preferably 4 or 5.  We also recommend a diet low in fat and sugar.  Inactivity, poor dietary choices and obesity can cause diabetes, heart attack, stroke, and kidney damage, among others.    ALCOHOL AND SMOKING:  Women should limit their alcohol intake to no more than 7 drinks/beers/glasses of wine (combined, not each!) per week. Moderation of alcohol intake to this level decreases your risk of breast cancer and liver damage. And of course, no recreational drugs are part of a healthy lifestyle.  And absolutely no smoking or even second hand smoke. Most people know smoking can cause heart and lung diseases, but did you know it also contributes to weakening of your bones? Aging of your skin?  Yellowing of your teeth and nails?  CALCIUM AND VITAMIN D:  Adequate intake of calcium and Vitamin D are recommended.  The recommendations for exact amounts of these supplements seem to change often, but generally speaking 600 mg of calcium (either carbonate or citrate) and 800 units of Vitamin D per day seems prudent. Certain women may benefit from higher intake of Vitamin D.  If you are among these women, your doctor will have told you during your visit.    PAP SMEARS:  Pap smears, to check for cervical cancer or precancers,  have traditionally been done yearly, although recent scientific advances have shown that most women can have pap smears less often.  However, every woman still should have a physical exam from her gynecologist every year. It will include a breast check, inspection of the vulva and vagina to check for abnormal growths or skin changes, a  visual exam of the cervix, and then an exam to evaluate the size and shape of the uterus and ovaries.  And after 77 years of age, a rectal exam is indicated to check for rectal cancers. We will also provide age appropriate advice regarding health maintenance, like when you should have certain vaccines, screening for sexually transmitted diseases, bone density testing, colonoscopy, mammograms, etc.   MAMMOGRAMS:  All women over 78 years old should have a yearly mammogram. Many facilities now offer a "3D" mammogram, which may cost around $50 extra out of pocket. If possible,  we recommend you accept the option to have the 3D mammogram performed.  It both reduces the number of women who will be called back for extra views which then turn out to be normal, and it is better than the routine mammogram at detecting truly abnormal areas.    COLONOSCOPY:  Colonoscopy to screen for colon cancer is recommended for all women at age 74.  We know, you hate the idea of the prep.  We agree, BUT, having colon cancer and not knowing it is worse!!  Colon cancer so often starts as a polyp that can be seen and removed at colonscopy, which can quite literally save your life!  And if your first colonoscopy is normal and you have no family history of colon cancer, most women don't have to have it again for 10 years.  Once every ten years, you can do something that may end up saving your life,  right?  We will be happy to help you get it scheduled when you are ready.  Be sure to check your insurance coverage so you understand how much it will cost.  It may be covered as a preventative service at no cost, but you should check your particular policy.

## 2022-09-23 DIAGNOSIS — Z23 Encounter for immunization: Secondary | ICD-10-CM | POA: Diagnosis not present

## 2022-09-26 ENCOUNTER — Other Ambulatory Visit: Payer: Self-pay | Admitting: Psychiatry

## 2022-09-26 DIAGNOSIS — F5105 Insomnia due to other mental disorder: Secondary | ICD-10-CM

## 2022-09-26 DIAGNOSIS — F411 Generalized anxiety disorder: Secondary | ICD-10-CM

## 2022-09-30 DIAGNOSIS — Z1231 Encounter for screening mammogram for malignant neoplasm of breast: Secondary | ICD-10-CM | POA: Diagnosis not present

## 2022-10-03 ENCOUNTER — Encounter: Payer: Self-pay | Admitting: Obstetrics and Gynecology

## 2022-10-19 ENCOUNTER — Other Ambulatory Visit: Payer: Self-pay

## 2022-10-19 ENCOUNTER — Telehealth: Payer: Self-pay | Admitting: Psychiatry

## 2022-10-19 DIAGNOSIS — F411 Generalized anxiety disorder: Secondary | ICD-10-CM

## 2022-10-19 NOTE — Telephone Encounter (Signed)
pended

## 2022-10-19 NOTE — Telephone Encounter (Signed)
Pt called and  refill on her  ativan. Pharmacy is [facility]

## 2022-10-20 ENCOUNTER — Ambulatory Visit: Payer: Medicare Other | Admitting: Psychiatry

## 2022-10-24 MED ORDER — LORAZEPAM 0.5 MG PO TABS: ORAL_TABLET | ORAL | 1 refills | Status: DC

## 2022-10-27 ENCOUNTER — Encounter: Payer: Self-pay | Admitting: Psychiatry

## 2022-10-27 ENCOUNTER — Ambulatory Visit (INDEPENDENT_AMBULATORY_CARE_PROVIDER_SITE_OTHER): Payer: Medicare Other | Admitting: Psychiatry

## 2022-10-27 DIAGNOSIS — F3181 Bipolar II disorder: Secondary | ICD-10-CM | POA: Diagnosis not present

## 2022-10-27 DIAGNOSIS — F5105 Insomnia due to other mental disorder: Secondary | ICD-10-CM | POA: Diagnosis not present

## 2022-10-27 DIAGNOSIS — F411 Generalized anxiety disorder: Secondary | ICD-10-CM | POA: Diagnosis not present

## 2022-10-27 DIAGNOSIS — Z79899 Other long term (current) drug therapy: Secondary | ICD-10-CM

## 2022-10-27 MED ORDER — LORAZEPAM 0.5 MG PO TABS
ORAL_TABLET | ORAL | 1 refills | Status: DC
Start: 2022-10-27 — End: 2023-01-25

## 2022-10-27 NOTE — Patient Instructions (Signed)
[facility] INFORMATION PROGRAM [facility]) [address] [place], [place] [address] [phone]
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
## 2022-11-13 ENCOUNTER — Other Ambulatory Visit: Payer: Self-pay | Admitting: Psychiatry

## 2022-11-13 DIAGNOSIS — F3181 Bipolar II disorder: Secondary | ICD-10-CM

## 2022-11-13 DIAGNOSIS — Z79899 Other long term (current) drug therapy: Secondary | ICD-10-CM

## 2022-11-14 NOTE — Telephone Encounter (Signed)
LF 07/23; LV 10/17; NV 1/15

## 2022-11-29 ENCOUNTER — Telehealth: Payer: Self-pay | Admitting: Psychiatry

## 2022-11-29 NOTE — Telephone Encounter (Signed)
Amy [name] reports that she thought she was following her calendar but was confused. She is concerned with her driving, she states she was driving very careful. She states that she was feeling anxious about driving. She thought she had two appts today 10 and 12, but somehow got confused. She reports that the last two weeks she has become forgetful. She says that its not normally like her. She reports being a day off thinking today is Monday but its Tuesday.  She reports that her sleep is excellent. She is taking Lorazepam 1 HS and she takes them as directed and it seems to be working well.   She would like to know what to do to help regain her memory strength so she's not confused.

## 2022-11-29 NOTE — Telephone Encounter (Signed)
Next appt is 01/24/22. Amy [name]'s daughter told her to call regarding about this issue below. Amy [name] has two appointments today and when she went to the providers, was told she didn't have any appointments today. She is able to drive. She is concerned about memory loss. Her phone number is [phone].

## 2022-11-29 NOTE — Telephone Encounter (Signed)
She needs to see her PCP who is Michail Jewels, I believe.  She needs to have a physical and have labs checked for her memory as a first step.

## 2022-11-30 DIAGNOSIS — R410 Disorientation, unspecified: Secondary | ICD-10-CM | POA: Diagnosis not present

## 2022-11-30 DIAGNOSIS — N390 Urinary tract infection, site not specified: Secondary | ICD-10-CM | POA: Diagnosis not present

## 2022-11-30 DIAGNOSIS — F319 Bipolar disorder, unspecified: Secondary | ICD-10-CM | POA: Diagnosis not present

## 2022-12-01 NOTE — Telephone Encounter (Signed)
She was seen by pcp yesterday,  she sent in Nitrofurantoin  Mono 100 mg for UTI 5 day PCP suggested she sees you for her attitude.

## 2022-12-07 ENCOUNTER — Ambulatory Visit: Payer: Medicare Other | Admitting: Psychiatry

## 2022-12-17 ENCOUNTER — Other Ambulatory Visit: Payer: Self-pay | Admitting: Psychiatry

## 2022-12-17 DIAGNOSIS — F411 Generalized anxiety disorder: Secondary | ICD-10-CM

## 2022-12-17 DIAGNOSIS — F5105 Insomnia due to other mental disorder: Secondary | ICD-10-CM

## 2023-01-25 ENCOUNTER — Ambulatory Visit: Payer: Medicare Other | Admitting: Psychiatry

## 2023-01-25 ENCOUNTER — Encounter: Payer: Self-pay | Admitting: Psychiatry

## 2023-01-25 DIAGNOSIS — G3184 Mild cognitive impairment, so stated: Secondary | ICD-10-CM

## 2023-01-25 DIAGNOSIS — F3181 Bipolar II disorder: Secondary | ICD-10-CM

## 2023-01-25 DIAGNOSIS — F5105 Insomnia due to other mental disorder: Secondary | ICD-10-CM | POA: Diagnosis not present

## 2023-01-25 DIAGNOSIS — F411 Generalized anxiety disorder: Secondary | ICD-10-CM | POA: Diagnosis not present

## 2023-01-25 DIAGNOSIS — Z79899 Other long term (current) drug therapy: Secondary | ICD-10-CM | POA: Diagnosis not present

## 2023-01-25 MED ORDER — LITHIUM CARBONATE 150 MG PO CAPS
ORAL_CAPSULE | ORAL | 1 refills | Status: AC
Start: 2023-01-25 — End: ?

## 2023-01-25 MED ORDER — LORAZEPAM 0.5 MG PO TABS: ORAL_TABLET | ORAL | 1 refills | Status: DC

## 2023-01-25 MED ORDER — MIRTAZAPINE 15 MG PO TABS
15.0000 mg | ORAL_TABLET | Freq: Every day | ORAL | 1 refills | Status: AC
Start: 2023-01-25 — End: ?
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
## 2023-02-14 DIAGNOSIS — L2989 Other pruritus: Secondary | ICD-10-CM | POA: Diagnosis not present

## 2023-02-14 DIAGNOSIS — L853 Xerosis cutis: Secondary | ICD-10-CM | POA: Diagnosis not present

## 2023-02-14 DIAGNOSIS — L111 Transient acantholytic dermatosis [Grover]: Secondary | ICD-10-CM | POA: Diagnosis not present

## 2023-02-14 DIAGNOSIS — D485 Neoplasm of uncertain behavior of skin: Secondary | ICD-10-CM | POA: Diagnosis not present

## 2023-02-14 DIAGNOSIS — C44722 Squamous cell carcinoma of skin of right lower limb, including hip: Secondary | ICD-10-CM | POA: Diagnosis not present

## 2023-02-14 DIAGNOSIS — L570 Actinic keratosis: Secondary | ICD-10-CM | POA: Diagnosis not present

## 2023-02-22 DIAGNOSIS — R300 Dysuria: Secondary | ICD-10-CM | POA: Diagnosis not present

## 2023-02-22 DIAGNOSIS — A499 Bacterial infection, unspecified: Secondary | ICD-10-CM | POA: Diagnosis not present

## 2023-02-22 DIAGNOSIS — N390 Urinary tract infection, site not specified: Secondary | ICD-10-CM | POA: Diagnosis not present

## 2023-03-01 DIAGNOSIS — M810 Age-related osteoporosis without current pathological fracture: Secondary | ICD-10-CM | POA: Diagnosis not present

## 2023-03-01 DIAGNOSIS — Z1212 Encounter for screening for malignant neoplasm of rectum: Secondary | ICD-10-CM | POA: Diagnosis not present

## 2023-03-01 DIAGNOSIS — E039 Hypothyroidism, unspecified: Secondary | ICD-10-CM | POA: Diagnosis not present

## 2023-03-01 DIAGNOSIS — E785 Hyperlipidemia, unspecified: Secondary | ICD-10-CM | POA: Diagnosis not present

## 2023-03-01 LAB — LAB REPORT - SCANNED: EGFR: 69.6

## 2023-03-08 DIAGNOSIS — E559 Vitamin D deficiency, unspecified: Secondary | ICD-10-CM | POA: Diagnosis not present

## 2023-03-08 DIAGNOSIS — N39498 Other specified urinary incontinence: Secondary | ICD-10-CM | POA: Diagnosis not present

## 2023-03-08 DIAGNOSIS — Z1331 Encounter for screening for depression: Secondary | ICD-10-CM | POA: Diagnosis not present

## 2023-03-08 DIAGNOSIS — N390 Urinary tract infection, site not specified: Secondary | ICD-10-CM | POA: Diagnosis not present

## 2023-03-08 DIAGNOSIS — I10 Essential (primary) hypertension: Secondary | ICD-10-CM | POA: Diagnosis not present

## 2023-03-08 DIAGNOSIS — E785 Hyperlipidemia, unspecified: Secondary | ICD-10-CM | POA: Diagnosis not present

## 2023-03-08 DIAGNOSIS — R82998 Other abnormal findings in urine: Secondary | ICD-10-CM | POA: Diagnosis not present

## 2023-03-08 DIAGNOSIS — M810 Age-related osteoporosis without current pathological fracture: Secondary | ICD-10-CM | POA: Diagnosis not present

## 2023-03-08 DIAGNOSIS — Z1339 Encounter for screening examination for other mental health and behavioral disorders: Secondary | ICD-10-CM | POA: Diagnosis not present

## 2023-03-08 DIAGNOSIS — Z0000 Encounter for general adult medical examination without abnormal findings: Secondary | ICD-10-CM | POA: Diagnosis not present

## 2023-03-08 DIAGNOSIS — F319 Bipolar disorder, unspecified: Secondary | ICD-10-CM | POA: Diagnosis not present

## 2023-03-08 DIAGNOSIS — E039 Hypothyroidism, unspecified: Secondary | ICD-10-CM | POA: Diagnosis not present

## 2023-03-08 DIAGNOSIS — K219 Gastro-esophageal reflux disease without esophagitis: Secondary | ICD-10-CM | POA: Diagnosis not present

## 2023-03-08 LAB — LAB REPORT - SCANNED
Albumin, Urine POC: 4.7
Creatinine, POC: 43.3 mg/dL
Microalb Creat Ratio: 10

## 2023-03-20 ENCOUNTER — Telehealth: Payer: Self-pay | Admitting: Psychiatry

## 2023-03-20 DIAGNOSIS — C44722 Squamous cell carcinoma of skin of right lower limb, including hip: Secondary | ICD-10-CM | POA: Diagnosis not present

## 2023-03-20 NOTE — Telephone Encounter (Signed)
 The pharmacy called her and said that they are using a different manufacturer for the lithium carbonate. She said that she has been on the the same medication for 20 years. Please give her a call at [phone]

## 2023-03-20 NOTE — Telephone Encounter (Signed)
 Pt lvm that

## 2023-03-22 ENCOUNTER — Telehealth: Payer: Self-pay | Admitting: Psychiatry

## 2023-03-22 DIAGNOSIS — N390 Urinary tract infection, site not specified: Secondary | ICD-10-CM | POA: Diagnosis not present

## 2023-03-22 DIAGNOSIS — Z23 Encounter for immunization: Secondary | ICD-10-CM | POA: Diagnosis not present

## 2023-03-22 NOTE — Telephone Encounter (Signed)
 Tameya called again this morning about the Lithium manufacture.  She has taken some of the new pills and hasn't notice any problem.  I did tell her that most of the time changing manufacture doesn't matter. Please call to reassure her if this is the case.  But more importantly she reported that when she picked up her medication she was given Hydroxyzine 10mg  instead of Lorazepam.  Did he change her medication?  She has taken the Hydroxyzine but it doesn't help like the Lorazepam.  Was this a mistake?

## 2023-03-22 NOTE — Telephone Encounter (Signed)
 Patient reports no issues with the new lithium manufacturer. I told her that there are not usually issues but pharmacy has to notify about a change. Dr. Jennelle Human did not prescribe hydroxyzine and patient has a RF available on the lorazepam. Question if the pharmacy mixed up medication. Suggested patient take hydroxyzine to the pharmacy and let them know about the issue.

## 2023-03-27 ENCOUNTER — Encounter: Payer: Self-pay | Admitting: Obstetrics and Gynecology

## 2023-03-27 ENCOUNTER — Ambulatory Visit (INDEPENDENT_AMBULATORY_CARE_PROVIDER_SITE_OTHER): Admitting: Obstetrics and Gynecology

## 2023-03-27 VITALS — BP 138/82 | HR 81 | Ht 66.0 in | Wt 122.0 lb

## 2023-03-27 DIAGNOSIS — N390 Urinary tract infection, site not specified: Secondary | ICD-10-CM | POA: Diagnosis not present

## 2023-03-27 DIAGNOSIS — N952 Postmenopausal atrophic vaginitis: Secondary | ICD-10-CM | POA: Diagnosis not present

## 2023-03-27 LAB — URINALYSIS, COMPLETE W/RFL CULTURE
Bacteria, UA: NONE SEEN /HPF
Bilirubin Urine: NEGATIVE
Glucose, UA: NEGATIVE
Hgb urine dipstick: NEGATIVE
Hyaline Cast: NONE SEEN /LPF
Ketones, ur: NEGATIVE
Leukocyte Esterase: NEGATIVE
Nitrites, Initial: NEGATIVE
Protein, ur: NEGATIVE
RBC / HPF: NONE SEEN /HPF (ref 0–2)
Specific Gravity, Urine: 1.015 (ref 1.001–1.035)
WBC, UA: NONE SEEN /HPF (ref 0–5)
pH: 7 (ref 5.0–8.0)

## 2023-03-27 LAB — NO CULTURE INDICATED

## 2023-03-27 MED ORDER — ESTRADIOL 0.1 MG/GM VA CREA: TOPICAL_CREAM | VAGINAL | 1 refills | Status: DC

## 2023-03-27 NOTE — Patient Instructions (Addendum)
 Vaginal Dryness and Thinning (Atrophic Vaginitis): What to Know  Atrophic vaginitis is when the lining of the vagina becomes dry, thin, and inflamed. Tell your doctor if you notice any changes in your vagina. They can help you find ways to feel better. They can also treat infections and suggest healthy habits for a healthy vagina. What are the causes? This condition is caused by a drop in estrogen. This affects the moisture in the lining of your vagina. When estrogen levels are low, the tissues become dry. This is most common when menstrual periods stop during menopause. What increases the risk? You're more likely to get this condition if: You take medicines that block estrogen. You've had your ovaries taken out. You're being treated for cancer. You recently had a baby. You're breastfeeding. You're over 53 years old. You have an eating disorder. You smoke. What are the signs or symptoms? Pain during sex. Soreness during sex. Bleeding during sex. Burning or itching around your vagina. Loss of interest in sex. Burning pain when you pee. Peeing often. Fluid coming from your vagina that isn't normal. It may be: White. Wallace Cullens. Yellow. Tinted with blood. Thick. Watery. Some people don't have symptoms. How is this treated? Using a lubricant before sex. Using a moisturizer in the vagina. Using estrogen in the vagina. You may not need treatment if your symptoms are mild or you're not having sex. Follow these instructions at home: Medicines Take your medicines only as told. Do not use herbal or other medicines unless your doctor says it's safe. Use creams, lubricants, or moisturizers only as told. General instructions Talk with your doctor about any menopause symptoms and treatment options. Do not douche. Do not use scented: Sprays. Tampons. Soaps. If sex hurts, try using lubricants right before you have sex. Contact a doctor if: You have fluid coming from the vagina that's not  normal. You have a smell coming from your vagina. You have new symptoms. Your symptoms don't get better with treatment. Your symptoms get worse. This information is not intended to replace advice given to you by your health care provider. Make sure you discuss any questions you have with your health care provider. Document Revised: 08/08/2022 Document Reviewed: 08/08/2022 Elsevier Patient Education  2024 [employer].

## 2023-03-27 NOTE — Progress Notes (Signed)
 GYNECOLOGY  VISIT   HPI: 78 y.o.   Widowed  Caucasian female   G2P2 with Patient's last menstrual period was 01/10/2005 (approximate).   here for: repeat bladder inf-- Pt has had a UTI in Nov, twice in Feb, and March. Currently treated with Doxycycline. Pt reported urinary frequency, confusion and irritation.  Seen and treated for UTI by both PCP and [facility].  [facility] data base searched with the following results: 11/30/22:   > 2 organisms, >100, 000 colony forming units 03/08/23:  mixed flora 03/22/23:  E Coli sensitive to all abx tested.  Patient brings in her prescription bottles.  Rx for Nitrofurantoin - Nov. 2024 -  Lydia Guiles, CRNP  Rx for Cipro - Feb.  2025 - Dr. Salli Real Rx for Keflex - Feb. 2025 - Dr. [name] Rx for Doxycycline - March 2025 - Dr. [name]  With infection, she experiences malaise, confusion, frequent urination.  Does not have pain.   Has increased urinary incontinence when she has an infection.   Followed here at this office for vaginal atrophy treated with estradiol cream.  She has not been using the estrogen for a long time.   GYNECOLOGIC HISTORY: Patient's last menstrual period was 01/10/2005 (approximate). Contraception:  PMP Menopausal hormone therapy:  estrace Last 2 paps:  09/09/20 neg, 6/18/2- neg History of abnormal Pap or positive HPV:  no Mammogram:  09/30/22 Breast Density Cat B, BI-RADS CAT 2 benign        OB History     Gravida  2   Para  2   Term      Preterm      AB      Living  2      SAB      IAB      Ectopic      Multiple      Live Births                 Patient Active Problem List   Diagnosis Date Noted   GAD (generalized anxiety disorder) 11/13/2017    Past Medical History:  Diagnosis Date   Anxiety    Bipolar 1 disorder (HCC)    Breast mass, left    Cancer (HCC)    hx of skin cancer   Confusion    Depression    Eczema 2021   Fibroid    GERD (gastroesophageal reflux disease)     History of colon polyps    Hyperparathyroidism    Hypothyroidism    Leg lesion    Osteopenia    Restless leg    Thyroid disease    UTI (lower urinary tract infection)     Past Surgical History:  Procedure Laterality Date   BREAST LUMPECTOMY WITH RADIOACTIVE SEED LOCALIZATION Left 12/28/2015   Procedure: LEFT BREAST LUMPECTOMY WITH RADIOACTIVE SEED LOCALIZATION;  Surgeon: Abigail Miyamoto, MD;  Location: [facility];  Service: General;  Laterality: Left;   BREAST SURGERY     breast biopsy (Rt)   DILATION AND CURETTAGE OF UTERUS     HYSTEROSCOPY WITH D & C  2006   postmenopausal bleeding, endometrial polyp   LIPOSUCTION TRUNK     and placed fat tissue in vocal cords   MOHS SURGERY  2024   SKIN CANCER EXCISION     hands and thigh   THYROID SURGERY  2011, 2004   Para Thyroid   TONSILLECTOMY      Current Outpatient Medications  Medication Sig Dispense Refill  Calcium Citrate (CITRACAL PO) Take by mouth 2 (two) times daily.     Cholecalciferol 25 MCG (1000 UT) capsule Take 1 tablet by mouth daily.     doxycycline (VIBRAMYCIN) 100 MG capsule Take 100 mg by mouth 2 (two) times daily.     levothyroxine (SYNTHROID) 88 MCG tablet TAKE 1 TABLET BY MOUTH ONCE DAILY BEFORE BREAKFAST 30 tablet 1   lidocaine (XYLOCAINE) 2 % jelly USE AS NEEDED FOR PAINFUL HEMORROIDS THREE TIMES DAILY 30 mL 1   lithium carbonate 150 MG capsule TAKE 1 CAPSULE BY MOUTH IN THE MORNING AND 2 AT BEDTIME 270 capsule 1   loratadine (CLARITIN) 10 MG tablet Take 10 mg by mouth daily.     LORazepam (ATIVAN) 0.5 MG tablet Take 1 tablet by mouth twice daily as needed for anxiety 30 tablet 1   Melatonin 10 MG CAPS Take 1 tablet by mouth at bedtime.     mirtazapine (REMERON) 15 MG tablet Take 1 tablet (15 mg total) by mouth at bedtime. 90 tablet 1   omeprazole (PRILOSEC) 40 MG capsule Take 40 mg by mouth daily.  2   psyllium (METAMUCIL) 58.6 % packet Take by mouth.     QC PINK BISMUTH 262 MG chewable  tablet      rosuvastatin (CRESTOR) 20 MG tablet Take 20 mg by mouth daily at 12 noon. Taking once weekly     estradiol (ESTRACE) 0.1 MG/GM vaginal cream Use 1 gram vaginally and a pea size amount to the urethra two times per week. 42.5 g 1   No current facility-administered medications for this visit.     ALLERGIES: Other  Family History  Problem Relation Age of Onset   Alzheimer's disease Mother    Stroke Mother    Hypertension Mother    Diabetes Father    Alzheimer's disease Father    Cancer Father        colon   Breast cancer Sister     Social History   Socioeconomic History   Marital status: Widowed    Spouse name: Not on file   Number of children: Not on file   Years of education: Not on file   Highest education level: Not on file  Occupational History   Not on file  Tobacco Use   Smoking status: Never   Smokeless tobacco: Never  Vaping Use   Vaping status: Never Used  Substance and Sexual Activity   Alcohol use: Not Currently    Alcohol/week: 1.0 standard drink of alcohol    Types: 1 Glasses of wine per week   Drug use: No   Sexual activity: Not Currently    Partners: Male    Birth control/protection: Post-menopausal    Comment: less than 5, after 16, no STD, no abnormal pap  Other Topics Concern   Not on file  Social History Narrative   Not on file   Social Drivers of Health   Financial Resource Strain: Not on file  Food Insecurity: Not on file  Transportation Needs: Not on file  Physical Activity: Not on file  Stress: Not on file  Social Connections: Not on file  Intimate Partner Violence: Not on file    Review of Systems  Genitourinary:  Positive for frequency.    PHYSICAL EXAMINATION:   BP 138/82 (BP Location: Left Arm, Patient Position: Sitting, Cuff Size: Small)   Pulse 81   Ht 5\' 6"  (1.676 m)   Wt 122 lb (55.3 kg)   LMP 01/10/2005 (Approximate)  SpO2 96%   BMI 19.69 kg/m     General appearance: alert, cooperative and appears  stated age   Pelvic: External genitalia:  no lesions              Urethra:  normal appearing urethra with no masses, tenderness or lesions              Bartholins and Skenes: normal                 Vagina: normal appearing vagina with normal color and discharge, no lesions.  Atrophy noted.               Cervix: no lesions                Bimanual Exam:  Uterus:  normal size, contour, position, consistency, mobility, non-tender              Adnexa: no mass, fullness, tenderness     Chaperone was present for exam:  Warren Lacy, CMA  ASSESSMENT:  Recurrent UTI.  Currently on Doxycycline for treatment through PCP.  Vaginal atrophy.   PLAN:  Urinalysis:  sg 1.015, ph 7.0, NS WBC, NS RBC, 0 - 5 epis, NS bacteria.  No UC sent.   Rx for vaginal estradiol cream 1 gram pv and a pea size amount to the urethra twice weekly.  Disp:  42 gram, RF one.   We discussed the benefits to treat atrophy and to prevent postmenopausal urinary tract infections.  Refer to urology.   Patient will try to get her UC results from her PCP.  Fu for a recheck here in 6 months and prn.  30 min  total time was spent for this patient encounter, including preparation, face-to-face counseling with the patient, coordination of care, and documentation of the encounter.

## 2023-04-11 ENCOUNTER — Telehealth: Payer: Self-pay

## 2023-04-11 NOTE — Telephone Encounter (Signed)
 I recommend that the patient see her PCP if she is experiencing episodes of confusion.   Thank you for helping her to understand how to use the vaginal estrogen cream.

## 2023-04-11 NOTE — Telephone Encounter (Signed)
 Pt LVM stating she was having trouble with the estradiol cream and asked for a cb for assistance.   Pt reports having trouble with applicator and was worried that she wasn't using it correctly. Stated that she has only used it twice but felt as if she could've developed another UTI since starting/using cream and doesn't feel as if using it or inserting correctly since experiencing her usual UTI sxs (confusion) this morning. Denies any urinary sxs (burning/urgency).   Pt guided on how to use applicator via telephone and she voiced understanding. Pt also advised that it may take more than two applications for her tissues to feel effects of medication. Advised to attempt 3-4 more times and to also reach out to [place] for an appointment with her supplies if she needs additional guidance on use.   Pt voiced understanding and appreciation for the call.  Please advise if anything additional. Thanks.

## 2023-04-12 NOTE — Telephone Encounter (Signed)
 Pt notified and voiced understanding. Encounter closed.

## 2023-04-18 ENCOUNTER — Telehealth: Payer: Self-pay | Admitting: *Deleted

## 2023-04-18 DIAGNOSIS — N390 Urinary tract infection, site not specified: Secondary | ICD-10-CM | POA: Diagnosis not present

## 2023-04-18 NOTE — Telephone Encounter (Signed)
 Patient left message requesting return call in f/u to call on 04/11/23.

## 2023-04-18 NOTE — Telephone Encounter (Signed)
 Spoke with patient. Patient states she has been using estradiol as instructed, has taken 4 doses. Still uncomfortable and experiencing confusion. Scheduled to see urology in 2 weeks, 4/30 with Dr. Arita Miss.   Patient A/O x4. Patient reports pain in vagina and when voiding. Urinary frequency. Denies flank pain and fever/chills.   Patient has not been seen by PCP since last call on 04/11/23. States her provider is out of the office.   Instructed patient to go directly to ER or urgent care for further evaluation. Instructed patient not to drive, patient states she has someone that can take her. Advised I will provide an update to Dr. Edward Jolly and our office will return call if any additional recommendations. Patient verbalizes understanding.   Routing to Dr. Edward Jolly to review.

## 2023-04-18 NOTE — Telephone Encounter (Signed)
 I agree with the need for medical evaluation in the ER.

## 2023-04-18 NOTE — Telephone Encounter (Signed)
 Encounter closed

## 2023-04-19 NOTE — Telephone Encounter (Signed)
 Patient return call requesting f/u.   Call returned to patient, Left message to call [facility] Triage at [phone], option 4.

## 2023-04-25 ENCOUNTER — Ambulatory Visit: Payer: Medicare Other | Admitting: Psychiatry

## 2023-05-08 DIAGNOSIS — F319 Bipolar disorder, unspecified: Secondary | ICD-10-CM | POA: Diagnosis not present

## 2023-05-08 DIAGNOSIS — R599 Enlarged lymph nodes, unspecified: Secondary | ICD-10-CM | POA: Diagnosis not present

## 2023-05-08 DIAGNOSIS — M810 Age-related osteoporosis without current pathological fracture: Secondary | ICD-10-CM | POA: Diagnosis not present

## 2023-05-08 DIAGNOSIS — E210 Primary hyperparathyroidism: Secondary | ICD-10-CM | POA: Diagnosis not present

## 2023-05-08 DIAGNOSIS — Z5181 Encounter for therapeutic drug level monitoring: Secondary | ICD-10-CM | POA: Diagnosis not present

## 2023-05-08 DIAGNOSIS — E890 Postprocedural hypothyroidism: Secondary | ICD-10-CM | POA: Diagnosis not present

## 2023-05-09 ENCOUNTER — Other Ambulatory Visit: Payer: Self-pay | Admitting: Psychiatry

## 2023-05-09 DIAGNOSIS — L820 Inflamed seborrheic keratosis: Secondary | ICD-10-CM | POA: Diagnosis not present

## 2023-05-09 DIAGNOSIS — F411 Generalized anxiety disorder: Secondary | ICD-10-CM

## 2023-05-09 DIAGNOSIS — L565 Disseminated superficial actinic porokeratosis (DSAP): Secondary | ICD-10-CM | POA: Diagnosis not present

## 2023-05-09 DIAGNOSIS — L814 Other melanin hyperpigmentation: Secondary | ICD-10-CM | POA: Diagnosis not present

## 2023-05-09 DIAGNOSIS — D485 Neoplasm of uncertain behavior of skin: Secondary | ICD-10-CM | POA: Diagnosis not present

## 2023-05-09 DIAGNOSIS — Z86018 Personal history of other benign neoplasm: Secondary | ICD-10-CM | POA: Diagnosis not present

## 2023-05-09 DIAGNOSIS — Z85828 Personal history of other malignant neoplasm of skin: Secondary | ICD-10-CM | POA: Diagnosis not present

## 2023-05-09 DIAGNOSIS — L570 Actinic keratosis: Secondary | ICD-10-CM | POA: Diagnosis not present

## 2023-05-09 DIAGNOSIS — L821 Other seborrheic keratosis: Secondary | ICD-10-CM | POA: Diagnosis not present

## 2023-05-09 DIAGNOSIS — C44722 Squamous cell carcinoma of skin of right lower limb, including hip: Secondary | ICD-10-CM | POA: Diagnosis not present

## 2023-05-09 DIAGNOSIS — L578 Other skin changes due to chronic exposure to nonionizing radiation: Secondary | ICD-10-CM | POA: Diagnosis not present

## 2023-05-09 DIAGNOSIS — L309 Dermatitis, unspecified: Secondary | ICD-10-CM | POA: Diagnosis not present

## 2023-05-10 DIAGNOSIS — N3020 Other chronic cystitis without hematuria: Secondary | ICD-10-CM | POA: Diagnosis not present

## 2023-05-10 DIAGNOSIS — R8271 Bacteriuria: Secondary | ICD-10-CM | POA: Diagnosis not present

## 2023-05-10 DIAGNOSIS — R350 Frequency of micturition: Secondary | ICD-10-CM | POA: Diagnosis not present

## 2023-05-10 DIAGNOSIS — N952 Postmenopausal atrophic vaginitis: Secondary | ICD-10-CM | POA: Diagnosis not present

## 2023-05-12 ENCOUNTER — Other Ambulatory Visit: Payer: Self-pay | Admitting: Psychiatry

## 2023-05-12 DIAGNOSIS — F411 Generalized anxiety disorder: Secondary | ICD-10-CM

## 2023-05-17 DIAGNOSIS — C44722 Squamous cell carcinoma of skin of right lower limb, including hip: Secondary | ICD-10-CM | POA: Diagnosis not present

## 2023-05-31 DIAGNOSIS — L089 Local infection of the skin and subcutaneous tissue, unspecified: Secondary | ICD-10-CM | POA: Diagnosis not present

## 2023-06-14 ENCOUNTER — Other Ambulatory Visit: Payer: Self-pay | Admitting: Psychiatry

## 2023-06-14 DIAGNOSIS — N952 Postmenopausal atrophic vaginitis: Secondary | ICD-10-CM | POA: Diagnosis not present

## 2023-06-14 DIAGNOSIS — F411 Generalized anxiety disorder: Secondary | ICD-10-CM

## 2023-06-14 DIAGNOSIS — N3020 Other chronic cystitis without hematuria: Secondary | ICD-10-CM | POA: Diagnosis not present

## 2023-06-15 ENCOUNTER — Other Ambulatory Visit: Payer: Self-pay | Admitting: Psychiatry

## 2023-06-15 DIAGNOSIS — F411 Generalized anxiety disorder: Secondary | ICD-10-CM

## 2023-06-27 DIAGNOSIS — L853 Xerosis cutis: Secondary | ICD-10-CM | POA: Diagnosis not present

## 2023-06-27 DIAGNOSIS — L905 Scar conditions and fibrosis of skin: Secondary | ICD-10-CM | POA: Diagnosis not present

## 2023-06-27 DIAGNOSIS — L565 Disseminated superficial actinic porokeratosis (DSAP): Secondary | ICD-10-CM | POA: Diagnosis not present

## 2023-06-27 DIAGNOSIS — L209 Atopic dermatitis, unspecified: Secondary | ICD-10-CM | POA: Diagnosis not present

## 2023-06-27 DIAGNOSIS — L858 Other specified epidermal thickening: Secondary | ICD-10-CM | POA: Diagnosis not present

## 2023-06-27 DIAGNOSIS — L280 Lichen simplex chronicus: Secondary | ICD-10-CM | POA: Diagnosis not present

## 2023-06-27 DIAGNOSIS — W57XXXA Bitten or stung by nonvenomous insect and other nonvenomous arthropods, initial encounter: Secondary | ICD-10-CM | POA: Diagnosis not present

## 2023-06-28 ENCOUNTER — Encounter: Payer: Self-pay | Admitting: Psychiatry

## 2023-06-28 ENCOUNTER — Ambulatory Visit: Admitting: Psychiatry

## 2023-06-28 DIAGNOSIS — F411 Generalized anxiety disorder: Secondary | ICD-10-CM | POA: Diagnosis not present

## 2023-06-28 DIAGNOSIS — G3184 Mild cognitive impairment, so stated: Secondary | ICD-10-CM

## 2023-06-28 DIAGNOSIS — Z79899 Other long term (current) drug therapy: Secondary | ICD-10-CM

## 2023-06-28 DIAGNOSIS — F3181 Bipolar II disorder: Secondary | ICD-10-CM | POA: Diagnosis not present

## 2023-06-28 DIAGNOSIS — F5105 Insomnia due to other mental disorder: Secondary | ICD-10-CM

## 2023-06-28 MED ORDER — LITHIUM CARBONATE 150 MG PO CAPS: ORAL_CAPSULE | ORAL | 1 refills | Status: DC

## 2023-06-28 MED ORDER — MIRTAZAPINE 15 MG PO TABS: 15.0000 mg | ORAL_TABLET | Freq: Every day | ORAL | 1 refills | Status: DC

## 2023-06-28 MED ORDER — LORAZEPAM 0.5 MG PO TABS
0.5000 mg | ORAL_TABLET | Freq: Two times a day (BID) | ORAL | 3 refills | Status: DC | PRN
Start: 2023-06-28 — End: 2023-10-11
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
## 2023-07-11 DIAGNOSIS — R8271 Bacteriuria: Secondary | ICD-10-CM | POA: Diagnosis not present

## 2023-07-11 DIAGNOSIS — N952 Postmenopausal atrophic vaginitis: Secondary | ICD-10-CM | POA: Diagnosis not present

## 2023-07-11 DIAGNOSIS — N3020 Other chronic cystitis without hematuria: Secondary | ICD-10-CM | POA: Diagnosis not present

## 2023-07-20 DIAGNOSIS — L245 Irritant contact dermatitis due to other chemical products: Secondary | ICD-10-CM | POA: Diagnosis not present

## 2023-07-28 DIAGNOSIS — K219 Gastro-esophageal reflux disease without esophagitis: Secondary | ICD-10-CM | POA: Diagnosis not present

## 2023-07-28 DIAGNOSIS — K5900 Constipation, unspecified: Secondary | ICD-10-CM | POA: Diagnosis not present

## 2023-07-28 DIAGNOSIS — Z86018 Personal history of other benign neoplasm: Secondary | ICD-10-CM | POA: Diagnosis not present

## 2023-08-14 DIAGNOSIS — N3000 Acute cystitis without hematuria: Secondary | ICD-10-CM | POA: Diagnosis not present

## 2023-08-16 ENCOUNTER — Telehealth: Payer: Self-pay | Admitting: Psychiatry

## 2023-08-16 NOTE — Telephone Encounter (Signed)
 Please put on CXL.

## 2023-08-16 NOTE — Telephone Encounter (Signed)
 On CXL. Spoke with neighbor Ole Crate stated concerned about Lithium  level. She did go for urine test yesterday for UTI. Results unknown at his time. Ole mentioned pt takes Lithium  all 3 at same time. Not as directed. Concerned  if a problem. Advised no DPR. We can listen but not give information. Ole will be available for couple of hours at [phone] cell  & home [phone]. She will be taking pt so need to know apt details.

## 2023-08-16 NOTE — Telephone Encounter (Signed)
 Pt is having consistent medical problems and is needing a sooner appt. Pt is having a lot of confusion and wants to bring her neighbor who helps her out a ton. Pt wanted me to send a msg to Dr. Geoffry asking if there is any way she can get a sooner appt.

## 2023-08-17 NOTE — Telephone Encounter (Signed)
 A friend of the patient called to express her concern about patient and possible memory issues and lithium  toxicity. The last lithium  level we show is from April. There is a note dated 8/4 from Dr. Johnette' office requesting a lithium  level, but I don't see an order.   The friend reported patient was taking all 3 lithium  tablets at the same time, rather than 1 AM and 2 PM as prescribed. She said she never reads the bottles. She did report today that she is taking as prescribed because her friend requested this. Pt says she does her own medications. Pt herself doesn't feel she is having memory issues, though has had frequent UTIs and all the issues related to that. She did admit to some confusion on dates/times. Reporting not being very social so maybe no need to keep up with this information.   She did not seem confused to me. Did report having some bipolar issues, but no details. She reports having a colonoscopy today and preparing for that is her focus right now.   She reports having 2 friends who are looking after her, one of which is an [employer] fundraiser. She is appreciative of their help, but is a bit overwhelmed as well. She has FU 9/16.

## 2023-08-17 NOTE — Telephone Encounter (Signed)
 It is ok to take lithium  all at once .  This won't cause lithium  toxicity.  Level 3 mos ago was normal but it is time to check another level.  She has preferred to get them from Dr. [name] and he has been agreeable.  Suggest she get a level and if they need us  to order it we will be happy to do so.  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  It could be getting a little worse but I'm not sure on that.   Just start by getting the level to make sure it's not too high.

## 2023-08-17 NOTE — Telephone Encounter (Signed)
 Recommendations provided to patient. She asks if her friend could come to her appt. I told her as long as you are agreeable to her being in the appt there should not be a problem.

## 2023-08-18 DIAGNOSIS — Z860101 Personal history of adenomatous and serrated colon polyps: Secondary | ICD-10-CM | POA: Diagnosis not present

## 2023-08-18 DIAGNOSIS — K649 Unspecified hemorrhoids: Secondary | ICD-10-CM | POA: Diagnosis not present

## 2023-08-18 DIAGNOSIS — Z09 Encounter for follow-up examination after completed treatment for conditions other than malignant neoplasm: Secondary | ICD-10-CM | POA: Diagnosis not present

## 2023-08-21 DIAGNOSIS — E039 Hypothyroidism, unspecified: Secondary | ICD-10-CM | POA: Diagnosis not present

## 2023-08-21 DIAGNOSIS — F319 Bipolar disorder, unspecified: Secondary | ICD-10-CM | POA: Diagnosis not present

## 2023-08-21 DIAGNOSIS — E785 Hyperlipidemia, unspecified: Secondary | ICD-10-CM | POA: Diagnosis not present

## 2023-08-21 DIAGNOSIS — I10 Essential (primary) hypertension: Secondary | ICD-10-CM | POA: Diagnosis not present

## 2023-08-21 DIAGNOSIS — K219 Gastro-esophageal reflux disease without esophagitis: Secondary | ICD-10-CM | POA: Diagnosis not present

## 2023-08-24 ENCOUNTER — Other Ambulatory Visit: Payer: Self-pay | Admitting: Psychiatry

## 2023-08-24 DIAGNOSIS — Z79899 Other long term (current) drug therapy: Secondary | ICD-10-CM

## 2023-08-24 DIAGNOSIS — F3181 Bipolar II disorder: Secondary | ICD-10-CM

## 2023-08-25 ENCOUNTER — Other Ambulatory Visit: Payer: Self-pay | Admitting: Psychiatry

## 2023-08-25 DIAGNOSIS — F3181 Bipolar II disorder: Secondary | ICD-10-CM

## 2023-08-25 DIAGNOSIS — Z79899 Other long term (current) drug therapy: Secondary | ICD-10-CM

## 2023-08-29 DIAGNOSIS — L565 Disseminated superficial actinic porokeratosis (DSAP): Secondary | ICD-10-CM | POA: Diagnosis not present

## 2023-08-29 DIAGNOSIS — D485 Neoplasm of uncertain behavior of skin: Secondary | ICD-10-CM | POA: Diagnosis not present

## 2023-08-29 DIAGNOSIS — C4441 Basal cell carcinoma of skin of scalp and neck: Secondary | ICD-10-CM | POA: Diagnosis not present

## 2023-09-06 DIAGNOSIS — Z800 Family history of malignant neoplasm of digestive organs: Secondary | ICD-10-CM | POA: Diagnosis not present

## 2023-09-06 DIAGNOSIS — D123 Benign neoplasm of transverse colon: Secondary | ICD-10-CM | POA: Diagnosis not present

## 2023-09-06 DIAGNOSIS — K5730 Diverticulosis of large intestine without perforation or abscess without bleeding: Secondary | ICD-10-CM | POA: Diagnosis not present

## 2023-09-06 DIAGNOSIS — K644 Residual hemorrhoidal skin tags: Secondary | ICD-10-CM | POA: Diagnosis not present

## 2023-09-06 DIAGNOSIS — Z860101 Personal history of adenomatous and serrated colon polyps: Secondary | ICD-10-CM | POA: Diagnosis not present

## 2023-09-06 DIAGNOSIS — K648 Other hemorrhoids: Secondary | ICD-10-CM | POA: Diagnosis not present

## 2023-09-06 DIAGNOSIS — D124 Benign neoplasm of descending colon: Secondary | ICD-10-CM | POA: Diagnosis not present

## 2023-09-06 DIAGNOSIS — D120 Benign neoplasm of cecum: Secondary | ICD-10-CM | POA: Diagnosis not present

## 2023-09-06 DIAGNOSIS — Z09 Encounter for follow-up examination after completed treatment for conditions other than malignant neoplasm: Secondary | ICD-10-CM | POA: Diagnosis not present

## 2023-09-08 DIAGNOSIS — D124 Benign neoplasm of descending colon: Secondary | ICD-10-CM | POA: Diagnosis not present

## 2023-09-08 DIAGNOSIS — D123 Benign neoplasm of transverse colon: Secondary | ICD-10-CM | POA: Diagnosis not present

## 2023-09-08 DIAGNOSIS — D120 Benign neoplasm of cecum: Secondary | ICD-10-CM | POA: Diagnosis not present

## 2023-09-26 ENCOUNTER — Ambulatory Visit: Admitting: Psychiatry

## 2023-09-26 DIAGNOSIS — C4441 Basal cell carcinoma of skin of scalp and neck: Secondary | ICD-10-CM | POA: Diagnosis not present

## 2023-09-27 ENCOUNTER — Ambulatory Visit: Admitting: Obstetrics and Gynecology

## 2023-10-04 DIAGNOSIS — Z23 Encounter for immunization: Secondary | ICD-10-CM | POA: Diagnosis not present

## 2023-10-11 ENCOUNTER — Encounter: Payer: Self-pay | Admitting: Psychiatry

## 2023-10-11 ENCOUNTER — Ambulatory Visit (INDEPENDENT_AMBULATORY_CARE_PROVIDER_SITE_OTHER): Admitting: Psychiatry

## 2023-10-11 DIAGNOSIS — F411 Generalized anxiety disorder: Secondary | ICD-10-CM | POA: Diagnosis not present

## 2023-10-11 DIAGNOSIS — G3184 Mild cognitive impairment, so stated: Secondary | ICD-10-CM

## 2023-10-11 DIAGNOSIS — N3000 Acute cystitis without hematuria: Secondary | ICD-10-CM | POA: Diagnosis not present

## 2023-10-11 DIAGNOSIS — R410 Disorientation, unspecified: Secondary | ICD-10-CM | POA: Diagnosis not present

## 2023-10-11 DIAGNOSIS — Z79899 Other long term (current) drug therapy: Secondary | ICD-10-CM | POA: Diagnosis not present

## 2023-10-11 DIAGNOSIS — E039 Hypothyroidism, unspecified: Secondary | ICD-10-CM | POA: Diagnosis not present

## 2023-10-11 DIAGNOSIS — N390 Urinary tract infection, site not specified: Secondary | ICD-10-CM | POA: Diagnosis not present

## 2023-10-11 DIAGNOSIS — F5105 Insomnia due to other mental disorder: Secondary | ICD-10-CM

## 2023-10-11 DIAGNOSIS — F3181 Bipolar II disorder: Secondary | ICD-10-CM | POA: Diagnosis not present

## 2023-10-11 DIAGNOSIS — I10 Essential (primary) hypertension: Secondary | ICD-10-CM | POA: Diagnosis not present

## 2023-10-11 DIAGNOSIS — F319 Bipolar disorder, unspecified: Secondary | ICD-10-CM | POA: Diagnosis not present

## 2023-10-11 MED ORDER — LORAZEPAM 0.5 MG PO TABS: 0.5000 mg | ORAL_TABLET | Freq: Two times a day (BID) | ORAL | 3 refills | Status: DC | PRN

## 2023-10-11 MED ORDER — LITHIUM CARBONATE 150 MG PO CAPS: 450.0000 mg | ORAL_CAPSULE | Freq: Every evening | ORAL | 0 refills | Status: DC

## 2023-10-11 NOTE — Progress Notes (Signed)
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 for patient specific instructions.  Future Appointments  Date Time Provider Department Center  11/15/2023  2:30 PM Cottle, Lorene [name] Raddle., MD [facility] None  12/05/2023  2:30 PM Cottle, Lorene [name] Raddle., MD [facility] None       Orders Placed This Encounter  Procedures   Lithium  level   Urinalysis   Basic metabolic panel       -------------------------------

## 2023-10-12 ENCOUNTER — Telehealth: Payer: Self-pay | Admitting: Psychiatry

## 2023-10-12 NOTE — Telephone Encounter (Signed)
 Pt lvm for  a phone call on how she is suppose to take the medication. Please call her at [phone]. I have talked to her twice today and told her how to take the medication.

## 2023-10-12 NOTE — Telephone Encounter (Signed)
 I called patient and had daughter and SIL on speaker. She said she did not get AVS from visit yesterday and said her family heard one thing and she heard something else. I reviewed several times with patient/family how she was supposed to take meds prescribed by us . She asked questions also about Synthroid , Crestor, and cephalexin. I told her/family that we didn't prescribe those medications and told her who prescribes them so they could verify dosing with them.  She had not gotten Rx of lithium  sent yesterday, which simplifies how she takes the medication from previous.  Told them to call any time there was a question.   [facility] prescribes cephalexin, Dr. Dale prescribes Synthroid , and Dr. Shepard prescribes Crestor.

## 2023-10-23 DIAGNOSIS — N390 Urinary tract infection, site not specified: Secondary | ICD-10-CM | POA: Diagnosis not present

## 2023-10-23 DIAGNOSIS — F319 Bipolar disorder, unspecified: Secondary | ICD-10-CM | POA: Diagnosis not present

## 2023-10-23 DIAGNOSIS — E039 Hypothyroidism, unspecified: Secondary | ICD-10-CM | POA: Diagnosis not present

## 2023-10-23 DIAGNOSIS — E785 Hyperlipidemia, unspecified: Secondary | ICD-10-CM | POA: Diagnosis not present

## 2023-10-23 DIAGNOSIS — I10 Essential (primary) hypertension: Secondary | ICD-10-CM | POA: Diagnosis not present

## 2023-10-23 DIAGNOSIS — F02A0 Dementia in other diseases classified elsewhere, mild, without behavioral disturbance, psychotic disturbance, mood disturbance, and anxiety: Secondary | ICD-10-CM | POA: Diagnosis not present

## 2023-10-23 DIAGNOSIS — R5383 Other fatigue: Secondary | ICD-10-CM | POA: Diagnosis not present

## 2023-10-24 ENCOUNTER — Other Ambulatory Visit: Payer: Self-pay | Admitting: Internal Medicine

## 2023-10-24 DIAGNOSIS — G3184 Mild cognitive impairment, so stated: Secondary | ICD-10-CM

## 2023-11-02 ENCOUNTER — Telehealth: Payer: Self-pay

## 2023-11-02 ENCOUNTER — Other Ambulatory Visit: Payer: Self-pay | Admitting: Psychiatry

## 2023-11-02 DIAGNOSIS — F3181 Bipolar II disorder: Secondary | ICD-10-CM

## 2023-11-02 DIAGNOSIS — Z79899 Other long term (current) drug therapy: Secondary | ICD-10-CM

## 2023-11-02 NOTE — Telephone Encounter (Signed)
 I agree .  Reduce lorazepam  to 1/2 of 0.5 mg tablet at night for 2 weeks and stop it.  There is no lower dose table.t

## 2023-11-02 NOTE — Telephone Encounter (Addendum)
 Dtr called to report PCP requested that lorazepam  dose be decreased due to cognitive issues. I read note from Dr. Shepard and did not see mention of this.   Dtr said they are still trying to navigate this stage. She reports patient is out of it when she gets up. She naps frequently throughout the day.   See Dr. Soledad note in Care Everywhere from 10/13.

## 2023-11-03 NOTE — Telephone Encounter (Signed)
 Reviewed recommendation for weaning lorazepam . Has appt with neurologist in a couple of weeks.

## 2023-11-07 ENCOUNTER — Encounter: Payer: Self-pay | Admitting: Neurology

## 2023-11-07 ENCOUNTER — Ambulatory Visit: Admitting: Neurology

## 2023-11-07 ENCOUNTER — Other Ambulatory Visit: Payer: Self-pay

## 2023-11-07 VITALS — BP 114/82 | Ht 66.0 in | Wt 115.6 lb

## 2023-11-07 DIAGNOSIS — R410 Disorientation, unspecified: Secondary | ICD-10-CM

## 2023-11-07 DIAGNOSIS — R4189 Other symptoms and signs involving cognitive functions and awareness: Secondary | ICD-10-CM

## 2023-11-07 DIAGNOSIS — R413 Other amnesia: Secondary | ICD-10-CM

## 2023-11-07 NOTE — Progress Notes (Signed)
 Provider:  Dedra Gores, MD  Primary Care Physician:  Amy Ade, MD [address] [place] [address]     Referring Provider: Shepard [name], [facility] [place],  [place] [address]          Chief Complaint according to patient   Patient presents with:     New Patient (Initial Visit)            HISTORY OF PRESENT ILLNESS:  Amy [name] is a 78 y.o. female patient who is seen Amy [name], [facility],  [place] [address]'d referral on 11/07/2023  for an Evaluation of COGNITIVE CONCERNS .MMSE 22/ 30 in PCP ffice.    Chief concern ( according to patient)  :   I am shy, I was never good with math, and not very conversant and my neighbors have felt I am slipping ,  I have the pleasure of seeing Amy [name] on 11/07/23, a right -handed female who reports  no  subjective difficulties with  memory, orientation, wordfinding. Family disagrees.  More often has she misplaced things.    The following examples were provided:   the patient has good long term memory, has supportive daughter and granddaughter, patient is on  Lithium  , Remeron , Lorazepam , and l- Thyroxine  Amy [name], .Amy [name].     Med History: There have been no traumatic brain injuries, she had para-thyroid  surgery,  has had mental illness  ( bipolar anxiety) .  ( Reviewed evidence  or documentation of :  Mood disorders,) Current medications as listed did not contain anticholinergic substances, narcotics,  but has a benzodiazepine..  The patient has adequate hearing and vision ability.  No loss of smell  or taste.  Social history: Amy [name]  Family status is widowed and lived alone since  05-2013,  and lives in a private  household alone  with a cat.  The daily routines are well established, meal times, bedtime, rise time.    Amy [name] has  adult children, 4 grandchildren. Support nearby provided by daughter and  granddaughter, here with SIL. Amy [name]    Amy [name] is reporting independence in the following activities of daily living:  [place] . No longer driving.   Prohibited by Amy [name].  The patient drives/ or member of the household drives: He/She goes shopping or orders items for daily needs, has access to fresh food in the home, and eats regular meals.  Meals are prepared by the patient or in the household. The patient eats out but needs some reminder of meal time. . The patient eats processed food  microwave.   Communication: able to use a land line telephone,  not by a mobile phone, or a computer.She makes no longer appointments by phone or online and keeps appointments.  ( NONE )  Reports independence in bill paying,  credit card , no longer does any banking, no paperwork such as for insurance or taxes. 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  Patient was married early, remarried later and had one daughter-  self employed.    Nicotine use; / .   ETOH use ; denies.  Substance use otherwise:  Caffeine intake in form of Coffee( /) Soda( pepsi - decaff) Tea ( /) or energy drinks. Physical activity in form of walking daily Hobbies and Social activities:  little now.    The referring physician  has kindly provided the following clinical history information and evolution of symptoms , imaging  and test results:   MMSE; 22/ 30   MOCA  MRI brain: with and without by Amy [name] ordered at [facility], 10-24-2023.        Review of Systems: Out of a  complete 14 system review, the patient complains of only the following symptoms, and all other reviewed systems are negative.:  See above      11/07/2023    2:35 PM 10/11/2023   11:48 AM  MMSE - Mini Mental State Exam  Orientation to time 5   Orientation to Place 3   Registration 3   Attention/ Calculation 1   Recall 3   Language- name 2 objects 2   Language- repeat 1   Language- follow 3 step command 3   Language- read & follow direction 1   Write a sentence 1   Copy design 0   Total score 23      Information is confidential and restricted. Go to Review Flowsheets to unlock data.       Social History   Socioeconomic History   Marital status: Widowed    Spouse name: Not on file   Number of children: Not on file   Years of education: Not on file   Highest education level: Not on file  Occupational History   Not on file  Tobacco Use   Smoking status: Never   Smokeless tobacco: Never  Vaping Use   Vaping status: Never Used  Substance and Sexual Activity   Alcohol  use: Not Currently    Alcohol /week: 1.0 standard drink of alcohol     Types: 1 Glasses of wine per week   Drug use: No   Sexual activity: Not Currently    Partners: Male    Birth control/protection: Post-menopausal    Comment: less than 5, after 16, no STD, no abnormal pap  Other Topics Concern   Not on file  Social History Narrative   1 pepsi daily    Social Drivers of Health   Financial Resource Strain: Not on file  Food Insecurity: Low Risk  (05/08/2023)   Received from [facility]   Hunger Vital Sign    Within the past 12 months, you worried that your food would run out before you got money to buy more: Never true    Within the past 12 months, the food you bought just didn't last and you didn't have money to get more. : Never true  Transportation Needs: No Transportation Needs (05/08/2023)   Received from [facility]    In the past 12 months, has lack of reliable  transportation kept you from medical appointments, meetings, work or from getting things needed for daily living? : No  Physical Activity: Not on file  Stress: Not on file  Social Connections: Not on file    Family History  Problem Relation Age of Onset   Alzheimer's disease Mother    Stroke Mother    Hypertension  Mother    Diabetes Father    Alzheimer's disease Father    Cancer Father        colon   Breast cancer Sister     Past Medical History:  Diagnosis Date   Anxiety    Bipolar 1 disorder (HCC)    Breast mass, left    Cancer (HCC)    hx of skin cancer   Confusion    Depression    Eczema 2021   Fibroid    GERD (gastroesophageal reflux disease)    History of colon polyps    Hyperparathyroidism    Hypothyroidism    Leg lesion    Osteopenia    Restless leg    Thyroid  disease    UTI (lower urinary tract infection)     Past Surgical History:  Procedure Laterality Date   BREAST LUMPECTOMY WITH RADIOACTIVE SEED LOCALIZATION Left 12/28/2015   Procedure: LEFT BREAST LUMPECTOMY WITH RADIOACTIVE SEED LOCALIZATION;  Surgeon: Vicenta Poli, MD;  Location: [facility];  Service: General;  Laterality: Left;   BREAST SURGERY     breast biopsy (Rt)   DILATION AND CURETTAGE OF UTERUS     HYSTEROSCOPY WITH D & C  2006   postmenopausal bleeding, endometrial polyp   LIPOSUCTION TRUNK     and placed fat tissue in vocal cords   MOHS SURGERY  2024   SKIN CANCER EXCISION     hands and thigh   THYROID  SURGERY  2011, 2004   Para Thyroid    TONSILLECTOMY       Current Outpatient Medications on File Prior to Visit  Medication Sig Dispense Refill   levothyroxine  (SYNTHROID ) 88 MCG tablet TAKE 1 TABLET BY MOUTH ONCE DAILY BEFORE BREAKFAST 30 tablet 1   lithium  carbonate 150 MG capsule TAKE 1 CAPSULE BY MOUTH IN THE MORNING AND 2 CAPSULES AT BEDTIME 90 capsule 0   LORazepam  (ATIVAN ) 0.5 MG tablet Take 1 tablet (0.5 mg total) by mouth 2 (two) times daily as needed.  for anxiety 30 tablet 3   mirtazapine  (REMERON ) 15 MG tablet Take 1 tablet (15 mg total) by mouth at bedtime. 90 tablet 1   rosuvastatin (CRESTOR) 20 MG tablet Take 20 mg by mouth daily at 12 noon. Taking once weekly     Calcium Citrate (CITRACAL PO) Take by mouth 2 (two) times daily. (Patient not taking: Reported on 11/07/2023)     Cholecalciferol 25 MCG (1000 UT) capsule Take 1 tablet by mouth daily. (Patient not taking: Reported on 11/07/2023)     doxycycline (VIBRAMYCIN) 100 MG capsule Take 100 mg by mouth 2 (two) times daily. (Patient not taking: Reported on 11/07/2023)     estradiol  (ESTRACE ) 0.1 MG/GM vaginal cream Use 1 gram vaginally and a pea size amount to the urethra two times per week. (Patient not taking: Reported on 11/07/2023) 42.5 g 1   lidocaine  (XYLOCAINE ) 2 % jelly USE AS NEEDED FOR PAINFUL HEMORROIDS THREE TIMES DAILY (Patient not taking: Reported on 11/07/2023) 30 mL 1   loratadine (CLARITIN) 10 MG tablet Take 10 mg by mouth daily. (Patient not taking: Reported on 11/07/2023)     Melatonin 10 MG CAPS Take 1 tablet by mouth at bedtime. (Patient not taking: Reported on 11/07/2023)     omeprazole (PRILOSEC) 40 MG capsule Take 40 mg by mouth daily. (Patient not taking: Reported on 11/07/2023)  2   psyllium (METAMUCIL) 58.6 % packet Take by mouth. (Patient not taking: Reported on 11/07/2023)     QC  PINK BISMUTH 262 MG chewable tablet  (Patient not taking: Reported on 11/07/2023)     No current facility-administered medications on file prior to visit.    Allergies  Allergen Reactions   Alprazolam     Other Reaction(s): addiction   Azithromycin     Other Reaction(s): Diarrhea   Codeine     Other Reaction(s): N/V   Corticosteroids     Other Reaction(s): Manic Attacks   Diazepam     Other Reaction(s): addiction   Other Other (See Comments)    Allergic rhinitis symptoms     DIAGNOSTIC DATA (LABS, IMAGING, TESTING) - I reviewed patient records, labs, notes, testing and  imaging myself where available.  Lab Results  Component Value Date   WBC 8.3 09/11/2012   HGB 13.7 09/11/2012   HCT 42.4 09/11/2012   MCV 95.3 09/11/2012   PLT 186 09/11/2012      Component Value Date/Time   NA 142 07/16/2019 1453   K 3.9 07/16/2019 1453   CL 106 07/16/2019 1453   CO2 30 07/16/2019 1453   GLUCOSE 116 07/16/2019 1453   BUN 11 07/16/2019 1453   CREATININE 0.67 07/16/2019 1453   CALCIUM 10.9 (H) 07/16/2019 1453   GFRNONAA >90 09/11/2012 1330   GFRAA >90 09/11/2012 1330   No results found for: CHOL, HDL, LDLCALC, LDLDIRECT, TRIG, CHOLHDL No results found for: YHAJ8R No results found for: VITAMINB12 Lab Results  Component Value Date   TSH 0.36 (L) 07/16/2019    PHYSICAL EXAM:  Tabular:  No data found.   Body mass index is 18.66 kg/m.   Wt Readings from Last 3 Encounters:  11/07/23 115 lb 9.6 oz (52.4 kg)  03/27/23 122 lb (55.3 kg)  09/21/22 128 lb (58.1 kg)     Ht Readings from Last 3 Encounters:  11/07/23 5' 6 (1.676 m)  03/27/23 5' 6 (1.676 m)  09/21/22 5' 6 (1.676 m)      Cardiovascular:  Regular rate and cardiac rhythm by pulse,  without distended neck veins. Respiratory: no tachypnoea or wheezing. .  Skin:  Without evidence of ankle edema, rash, bruising  The patient's posture was erect.   NEUROLOGIC EXAM: The patient was awake and alert, oriented to place and time.   Attention span & concentration ability appeared normal.  Speech was fluent, with dysphonia , some aphasia, and of reduced  volume.     Cranial nerves:  There was no  loss of smell or taste reported  Pupils are round, equal in size and briskly reactive to light.  Funduscopic exam was deferred.  Extraocular movements in vertical and horizontal planes were intact and without nystagmus. (No Diplopia reported). Visual fields by finger perimetry are intact. Hearing was impaired     Facial sensation intact ( fine touch).  Facial motor strength: Masked  masked  Symmetric movement and tongue and uvula move midline.  Neck ROM: rotation, tilt and flexion /extension were observed,  shoulder shrug was symmetrical.    Motor exam:  Symmetric bulk, strength and ROM.   Muscle tone was  bilateral biceps cog- wheeling,  wrist  rigidity,  low muscle mass, and there was symmetric grip strength.   Sensory:  Fine touch and vibration were tested by tuning fork and intact.  Proprioception tested in the upper extremities was normal.   Coordination: The patient reported no problems with button closure and no changes to penmanship.   The Finger-to-nose maneuver was intact without evidence of ataxia, dysmetria or tremor.  Gait and station: Patient could rise unassisted from a seated position, without  bracing, and walked without assistive device.  Stance was of normal/ wide width. She swayed left and right, stooped psoture, with reduced but not lost armswing.    The patient turned with 5 steps.  Toe and heel walk were deferred. No limp was noted.  Arm swing was reduced.  The patient's gait posture was stooped.   Deep tendon reflexes: Upper extremities did show symmetric DTRs. Lower extremity DTRs were symmetric and brisk/ attenuated.      Struggles with semantics, with processing, with multi step problems, not with multitasking.  Computer and remote control have overwwhelmed her, she no longer banks, no longer drives and she has no appetite, lost weight.    She believes her pills have been exchanged, have been replaced.      Dear Amy [name], [facility] [place],  [place] [address],   Thank you for entrusting me with your patient's care.   As you know, your patient  Amy [name] is a 78 y.o. female presented here on @10 -28-2025 for an Evaluation of cognitive decline/ dementia  upon your request .   ASSESSMENT  :     1) a bipolar depression disorder is associated with a higher risk of memory decline.   She has been on multiple  medications for years.   She has some visual misperceptions, hallucinations, and has renamed people in her orbit as being sin in law when grandson, etc.   2) she has a masked face, a slight gait and balance impairment and  dysphonia.  I suspect Lewy body dementia.   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 With good days and bad days, explaining the  variable scores on the same memory testing.   My Plan is to proceed with:   1) GNA DEMENTIA PANEL - only the part she hasn't yet had.  2) ATN 3) Late onset ALZHEIMER Genetic risk  4) EEG 5) MRI brain with and without , ordered already  by PCP but yet performed.   6 ) Start Donepezil (unless patient has bradycardia , IBS or REM BD) at 5 mg for 90 days, then increase to 10 mg- if tolerated. 7) Referral to neuropsychology for cognitive testing. 8) Refer for vision and audiology testing if applicable.   The plan is to identify cognitive domains affected by changes, to rule out brain lesions or vascular abnormalities, to obtain testing of  AD bio-markers, genetic markers and , if applicable , consolidate these findings by a PET scan.  Serial testing  by [facility] or MMSE test is needed for all patients that are currently scoring in the subjective memory loss, MCI, or mild dementia category.   A referral for neuropsychological interview and testing battery  has been ordered.  Medication to help slow cognitive decline are available but may not be tolerated, these are available in oral and patch form and their use is not specific to a single form of neurodegenerative cognitive disorder.   Early stages and mild stages of AD may qualify for infusion therapy, based on MOCA/MMSE score and risk status for brain bleeding (AIDA) .   I plan to follow up  through our NP or personally within 6 months.    The patient's condition requires frequent monitoring and adjustments in the treatment plan, reflecting the ongoing complexity of care.  This  provider is for the named interval time the continuing focal point for associated  medical /neurological needs services for this condition.   A total time of  60  minutes consistent of a part of face to face encounter , exam and interview,  and additional preparation time for chart review was spent. Additionally, the following were reviewed: Past medical records, past medical and surgical history, family and social background, as well as relevant laboratory results, imaging findings, and medical notes, where applicable.  At today's visit, we discussed treatment options, associated risk and benefits, and engage in counseling as needed: Including, but not limited to driving safety, home safety, the benefit of routines and activities.   Memory strategies were provided in the patient education attachment.   This note was generated in part by using dictation software, and as a result, it may contain unintentional typos and errors.  Nevertheless, effort was made to accurately convey the pertinent aspects of the patient's visit.    Electronically signed by: Dedra Gores, MD 11/07/2023 3:13 PM  [facility] and [facility] Board certified by The [employer] of Sleep Medicine and Diplomate of the [employer] of Sleep Medicine. Board certified In Neurology through the ABPN, Fellow of the [employer] of Neurology. [facility]@ [email or web address].    Tips for Healthy Aging:   Read the MIND DIET book for nutritional information.  Regular physical activity and daylight exposure. This lifts the mood and entrains a circadian rhythm, leading to better sleep and frees up the mind.   Walking a minimum of  20 minutes a day , if possible outdoors, preferable in a park or nature area.  Indoor exercised such as stretching , yoga , stationary bike or stair master ( at the gym). Read !  Keep up with daily events , news.  Maintain or establish new Hobbies , consider Volunteering, and consider  becoming a member of a club, church or other community memberships and engagements. Social interactions give purpose, joy and are interactive - Interaction is brain jogging!   Reconsider your driving ability-   Reconsider your home : The home is a single storey ( ranch/ apartment) ?  If you live in a multistory residence, are all stairs equipped with solid handrails on both sides ?  Can any existing staircase be retrofitted with a stair lift .  Do you need an entry ramp ?  The home that is suited to aging in space has low or no barriers to enter , to reach the lavatory  ( wide doors , high toilet seat, handrails), has a no barrier shower ( no tub ) with a scold guard water temperature setting, handheld shower head , and a freestanding shower seat ( consider a plastic garden armchair !) .   And are the doors wide enough to pass with walker or wheelchair?  Is there enough space in the bedroom to reach the bed from two or better three sides.  De- clutter and arrange your home for safety: place objects at eye height , not above- not below hip height-  you should not need to use a stepstool or ladder.   No overlapping area rugs, sufficient light, and passage space free of furniture , remove breakable items from side tables, night stands.  A Kitchen stovetop powered by induction prevents injuries and fires- replace gas tops and toaster ovens.

## 2023-11-07 NOTE — Patient Instructions (Signed)
 Lewy Body Dementia Lewy body dementia, or LBD, is a condition that affects the way your brain works. This type of dementia happens when proteins called Lewy bodies build up in the brain. It causes problems with memory, thinking, movement, and behavior. This condition gets worse over time. There's no cure, but some treatments can help with the symptoms. What are the causes? LBD happens when Lewy bodies build up in parts of the brain that control memory, thinking, and movement. What causes these proteins to build up isn't known. What increases the risk? Having a family history of LBD or Parkinson's disease. Being 80 years old or older. Being female. What are the signs or symptoms? Symptoms may include: Hallucinating. This means you see, hear, taste, smell, or feel things that are not real. Sleep problems, such as acting out dreams while you're asleep. Changes in memory, attention, and ability to focus that come and go. Symptoms of dementia, such as having trouble with: Memory. Paying attention. Planning and organizing. Making good choices. Speaking. Behavior. People with LBD may also have symptoms of Parkinson's disease. These may include: Shaking movements or tremors that you can't control. This usually starts in a hand or foot when you're resting. Stooped posture. Slow movement. Stiff muscles. Loss of balance when standing. How is this diagnosed? LBD is diagnosed by an expert called a neurologist. It may be diagnosed based on: Your symptoms and medical history. Your health care provider will talk with you and your family, friends, or caregivers about your history and symptoms. A physical exam. Tests. These may include: Lab tests, such as blood or pee (urine) tests. Imaging tests. You may have a CT scan, a PET scan, or an MRI. A lumbar puncture. For this test, a sample of the fluid around the brain and spinal cord is taken and tested. A skin biopsy. This is when a skin sample is  taken. The sample is looked at under a microscope to check for the protein. Tests to check your thinking and memory. How is this treated? There's no cure for LBD. Treatment helps manage your symptoms and might include: Medicines to help with hallucinations, sleep, and behavior problems. Therapy to help with talking or movement. This might be speech, occupational, and physical therapy. Your provider can help you find support groups and other people who can help with your care. Follow these instructions at home: Medicines Take over-the-counter and prescription medicines only as told by your provider. Use a pill organizer or pill reminder to help keep track of your medicines. Do not take medicines that can affect your thinking. This includes pain medicines and some medicines to help with sleep. Always check with your provider before taking any new medicines. Lifestyle Make healthy choices: Exercise and be active as told by your provider. Do not use any products that contain nicotine or tobacco. These products include cigarettes, chewing tobacco, and vaping devices, such as e-cigarettes. If you need help quitting, ask your provider. When you feel a lot of stress, do something that helps you relax. Try things like mindfulness, yoga, or deep breathing. Spend time with other people. Talk with family, friends, and neighbors often. Make sure you sleep well. These tips can help: Try not to take naps during the day. Keep your bedroom dark and cool. Do not exercise in the few hours before you go to bed. Do not have foods or drinks with caffeine in them at night. Eating and drinking Do not drink alcohol . Drink enough fluid to keep your  pee (urine) pale yellow. Eat a healthy diet. Safety  Talk with your provider to decide: What things you need help with. How to stay safe. If you have trouble walking, use a cane or walker as told by your provider. Make sure your home is safe. Get rid of things  that you could trip over, such as throw rugs or clutter. Put grab bars and railings in your home to keep you from falling. Talk with your provider about whether it's safe for you to drive. If told, wear an alert bracelet that tracks where you are and shows that you're a person with memory loss. Make sure you wear it at all times. General instructions  Work with your family to make big legal or health decisions. This may include things like advance directives, medical power of attorney, or a living will. Join a support group for people with LBD. Where to find more information [employer] and Stroke: basicfm.no Lewy Body Dementia Association: lbda.org Contact a health care provider if: You have confusion that's new or getting worse. You have trouble sleeping that's new or getting worse. You get sleepy during the day more often. You have problems with choking or swallowing. Get help right away if: You feel depressed or very sad or feel like you may hurt yourself. Your family members are worried about your safety. Get help right away if you feel like you may hurt yourself or others, or have thoughts about taking your own life. Go to your nearest emergency room or: Call 911. Call the National Suicide Prevention Lifeline at (571)624-2391 or 988. This is open 24 hours a day. Text the Crisis Text Line at 8047877860. This information is not intended to replace advice given to you by your health care provider. Make sure you discuss any questions you have with your health care provider. Document Revised: 03/14/2022 Document Reviewed: 03/14/2022 Elsevier Patient Education  2024 [employer]Problems With Thinking and Memory (Mild Neurocognitive Disorder): What to Know Mild neurocognitive disorder, formerly known as mild cognitive impairment, is a disorder where your memory doesn't work as well as it should. It may also affect other mental abilities like thinking,  communicating, behavior, and being able to finish tasks. These problems can be noticed and measured. But they usually don't stop you from doing daily activities or living on your own. Mild neurocognitive disorder usually happens after 78 years of age. But it can also happen at younger ages. It's not as serious as major neurocognitive disorder, also known as dementia, but it may be the first sign of it. In general, the symptoms of this condition get worse over time. In rare cases, symptoms can get better. What are the causes? This condition may be caused by: Brain disorders like Alzheimer's disease, Parkinson's disease, and other conditions that slowly damage nerve cells. Diseases that affect the blood vessels in the brain and cause small strokes. Certain infections, like HIV. Traumatic brain injury. Other medical conditions, such as brain tumors, underactive thyroid  (hypothyroidism), and not having enough vitamin B12. Using certain drugs or medicines. What increases the risk? Being older than 78 years of age. Being female. Having a lower level of education. Diabetes, high blood pressure, high cholesterol, and other conditions that raise the risk for blood vessel diseases. Untreated or undertreated sleep apnea. Having a certain type of gene that can be inherited, or passed down from parent to child. Long-term health problems like heart disease, lung disease, liver disease, kidney disease, or depression. What  are the signs or symptoms? Trouble remembering things. You may: Forget names, phone numbers, or details of recent events. Forget about social events and appointments. Often forget where you put your car keys or other items. Trouble thinking and solving problems. You may have trouble with complex tasks like: Paying bills. Driving in places you don't know well. Trouble communicating. You may have trouble: Finding the right word or naming an object. Forming a sentence that makes  sense. Understanding what you read or hear. Changes in your behavior or personality. When this happens, you may: Lose interest in the things you used to enjoy. Avoid being around people. Get angry more easily than usual. Act before thinking. How is this diagnosed? This condition is diagnosed based on: Your symptoms. Your health care provider may ask you and the people you spend time with, like family and friends, about your symptoms. Memory tests and other tests to check how your brain is working. Your provider may refer you to a provider called a neurologist or a mental health specialist. To try to find out the cause of your condition, your provider may: Get a detailed medical history. Ask about use of alcohol , drugs, and medicines. Do a physical exam. Order blood tests and brain imaging tests. How is this treated? Mild neurocognitive disorder that's caused by medicine use, drug use, infection, or another medical condition may get better when the cause is treated, or when medicines or drugs are stopped. If this disorder has another cause, it usually doesn't improve and may get worse. In these cases, the goal of treatment is to help you manage the symptoms. This may include: Medicines to help with memory and behavior symptoms. Talk therapy. This provides education, emotional support, memory aids, and other ways of making up for problems with mental tasks. Lifestyle changes. These may include: Getting regular exercise. Eating a healthy diet that includes omega-3 fatty acids. Doing things to challenge your thinking and memory skills. Spending more time being with and talking to other people. Using routines like having regular times for meals and going to bed. Follow these instructions at home: Eating and drinking  Drink more fluids as told. Eat a healthy diet that includes omega-3 fatty acids. These can be found in: Fish. Nuts. Leafy vegetables. Vegetable oils. If you drink  alcohol : Limit how much you have to: 0-1 drink a day if you're female. 0-2 drinks a day if you're female. Know how much alcohol  is in your drink. In the [place] one drink is one 12 oz bottle of beer (355 mL), one 5 oz glass of wine (148 mL), or one 1 oz glass of hard liquor (44 mL). Lifestyle  Get regular exercise as told by your provider. Do not smoke, vape, or use nicotine or tobacco. Use healthy ways to manage stress. If you need help managing stress, ask your provider. Keep spending time with other people. Keep your mind active by doing activities you enjoy, like reading or playing games. Make sure you get good sleep at night. These tips can help: Try not to take naps during the day. Keep your bedroom dark and cool. Do not exercise in the few hours before you go to bed. Do not have foods or drinks with caffeine at night. General instructions Take medicines only as told. Your provider may tell you to avoid taking medicines that can affect thinking. These include some medicines for pain or sleeping. Work with your provider to find out: What things you need help with. What  your safety needs are. Where to find more information [employer] on Aging: baseringtones.pl Contact a health care provider if: You have any new symptoms. Get help right away if: You have new confusion or your confusion gets worse. You act in ways that put you or your family in danger. This information is not intended to replace advice given to you by your health care provider. Make sure you discuss any questions you have with your health care provider. Document Revised: 06/21/2022 Document Reviewed: 06/21/2022 Elsevier Patient Education  2024 [employer].  As you know, your patient  Amy [name] is a 78 y.o. female presented here on @10 -28-2025 for an Evaluation of cognitive decline/ dementia  upon your request .     ASSESSMENT  :      1) a bipolar depression disorder is associated with a higher risk  of memory decline.   Amy [name] has been on multiple medications for years.    Amy [name] has some visual misperceptions, hallucinations, and has renamed people in her orbit as being sin in law when grandson, etc.    2) Amy [name] has a masked face, a slight gait and balance impairment and  dysphonia.  I suspect Lewy body dementia.    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 With good days and bad days, explaining the  variable scores on the same memory testing.    My Plan is to proceed with:     1) [facility]NA DEMENTIA PANEL - only the part Amy [name] hasn't yet had.  2) ATN 3) Late onset ALZHEIMER Genetic risk  4) EEG 5) MRI brain with and without , ordered already  by PCP but yet performed.    6 ) Start Donepezil (unless patient has bradycardia , IBS or REM BD) at 5 mg for 90 days, then increase to 10 mg- if tolerated. 7) Referral to neuropsychology for cognitive testing. 8) Refer for vision and audiology testing if applicable.    The plan is to identify cognitive domains affected by changes, to rule out brain lesions or vascular abnormalities, to obtain testing of  AD bio-markers, genetic markers and , if applicable , consolidate these findings by a PET scan.  Serial testing  by [facility] or MMSE test is needed for all patients that are currently scoring in the subjective memory loss, MCI, or mild dementia category.   A referral for neuropsychological interview and testing battery  has been ordered.  Medication to help slow cognitive decline are available but may not be tolerated, these are available in oral and patch form and their use is not specific to a single form of neurodegenerative cognitive disorder.    Early stages and mild stages of AD may qualify for infusion therapy, based on MOCA/MMSE score and risk status for brain bleeding (AIDA) .    I plan to follow up  through our NP or personally within 6 months.

## 2023-11-07 NOTE — Addendum Note (Signed)
 Addended by: SHONA SAVANT A on: 11/07/2023 04:12 PM   Modules accepted: Orders

## 2023-11-08 ENCOUNTER — Ambulatory Visit: Payer: Self-pay | Admitting: Neurology

## 2023-11-08 LAB — COMPREHENSIVE METABOLIC PANEL WITH GFR
ALT: 11 IU/L (ref 0–32)
AST: 14 IU/L (ref 0–40)
Albumin: 4.4 g/dL (ref 3.8–4.8)
Alkaline Phosphatase: 93 IU/L (ref 49–135)
BUN/Creatinine Ratio: 19 (ref 12–28)
BUN: 13 mg/dL (ref 8–27)
Bilirubin Total: 0.3 mg/dL (ref 0.0–1.2)
CO2: 26 mmol/L (ref 20–29)
Calcium: 10.9 mg/dL — ABNORMAL HIGH (ref 8.7–10.3)
Chloride: 102 mmol/L (ref 96–106)
Creatinine, Ser: 0.68 mg/dL (ref 0.57–1.00)
Globulin, Total: 2.4 g/dL (ref 1.5–4.5)
Glucose: 117 mg/dL — ABNORMAL HIGH (ref 70–99)
Potassium: 4.3 mmol/L (ref 3.5–5.2)
Sodium: 140 mmol/L (ref 134–144)
Total Protein: 6.8 g/dL (ref 6.0–8.5)
eGFR: 89 mL/min/1.73 (ref >59)

## 2023-11-08 NOTE — Telephone Encounter (Signed)
-----   Message from [place] Dohmeier sent at 11/08/2023  2:25 PM EDT ----- No new findings- an elevated calcium level has seen for 13 years (!), and the patient had eaten, Glucose is not abnormal.  ----- Message ----- From: Interface, [facility] Lab Results In Sent: 11/08/2023   5:37 AM EDT To: Dedra Gores, MD

## 2023-11-08 NOTE — Telephone Encounter (Signed)
 Called and relayed normal results pt voiced gratitude and understanding other than calcium elevation

## 2023-11-10 LAB — MULTIPLE MYELOMA PANEL, SERUM
Albumin SerPl Elph-Mcnc: 3.8 g/dL (ref 2.9–4.4)
Albumin/Glob SerPl: 1.4 (ref 0.7–1.7)
Alpha 1: 0.3 g/dL (ref 0.0–0.4)
Alpha2 Glob SerPl Elph-Mcnc: 0.9 g/dL (ref 0.4–1.0)
B-Globulin SerPl Elph-Mcnc: 1 g/dL (ref 0.7–1.3)
Gamma Glob SerPl Elph-Mcnc: 0.8 g/dL (ref 0.4–1.8)
Globulin, Total: 2.9 g/dL (ref 2.2–3.9)
IgA/Immunoglobulin A, Serum: 130 mg/dL (ref 64–422)
IgG (Immunoglobin G), Serum: 844 mg/dL (ref 586–1602)
IgM (Immunoglobulin M), Srm: 69 mg/dL (ref 26–217)
Total Protein: 6.7 g/dL (ref 6.0–8.5)

## 2023-11-10 LAB — ANA W/REFLEX IF POSITIVE: Anti Nuclear Antibody (ANA): NEGATIVE

## 2023-11-12 LAB — APOE ALZHEIMER'S DISEASE RISK

## 2023-11-12 LAB — ATN PROFILE
A -- Beta-amyloid 42/40 Ratio: 0.122 (ref >0.102)
Beta-amyloid 40: 106.93 pg/mL
Beta-amyloid 42: 13.01 pg/mL
N -- NfL, Plasma: 2.97 pg/mL (ref 0.00–6.04)
T -- p-tau181: 0.47 pg/mL (ref 0.00–0.97)

## 2023-11-13 ENCOUNTER — Ambulatory Visit: Admitting: Neurology

## 2023-11-13 DIAGNOSIS — R413 Other amnesia: Secondary | ICD-10-CM

## 2023-11-13 DIAGNOSIS — R410 Disorientation, unspecified: Secondary | ICD-10-CM

## 2023-11-13 DIAGNOSIS — R4189 Other symptoms and signs involving cognitive functions and awareness: Secondary | ICD-10-CM

## 2023-11-13 DIAGNOSIS — R4182 Altered mental status, unspecified: Secondary | ICD-10-CM | POA: Diagnosis not present

## 2023-11-15 ENCOUNTER — Encounter: Payer: Self-pay | Admitting: Psychiatry

## 2023-11-15 ENCOUNTER — Ambulatory Visit (INDEPENDENT_AMBULATORY_CARE_PROVIDER_SITE_OTHER): Admitting: Psychiatry

## 2023-11-15 DIAGNOSIS — F411 Generalized anxiety disorder: Secondary | ICD-10-CM | POA: Diagnosis not present

## 2023-11-15 DIAGNOSIS — F3181 Bipolar II disorder: Secondary | ICD-10-CM

## 2023-11-15 DIAGNOSIS — R4189 Other symptoms and signs involving cognitive functions and awareness: Secondary | ICD-10-CM | POA: Diagnosis not present

## 2023-11-15 DIAGNOSIS — F5105 Insomnia due to other mental disorder: Secondary | ICD-10-CM | POA: Diagnosis not present

## 2023-11-15 NOTE — Progress Notes (Signed)
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 Lorene KANDICE Raddle., MD CP[facility]CP None  05/28/2024  8:45 AM Whitfield Raisin, NP [facility] None       No orders of the defined types were placed in this encounter.      -------------------------------

## 2023-11-16 ENCOUNTER — Telehealth: Payer: Self-pay | Admitting: Psychiatry

## 2023-11-16 ENCOUNTER — Other Ambulatory Visit: Payer: Self-pay

## 2023-11-16 NOTE — Telephone Encounter (Signed)
 Pt was given med list and wants to go over what she is not currently taking so it can be taken off her med list.

## 2023-11-16 NOTE — Telephone Encounter (Signed)
 Patient called and wanted to review her med list. She wanted to know what meds were for, regardless of who prescribed them. I reviewed all with her, including OTC medications.

## 2023-11-21 NOTE — Procedures (Signed)
    History:  78 year old woman with cognitive decline   EEG classification: Awake and drowsy  Duration: 25 minutes   Technical aspects: This EEG study was done with scalp electrodes positioned according to the 10-20 International system of electrode placement. Electrical activity was reviewed with band pass filter of 1-70Hz , sensitivity of 7 uV/mm, display speed of 39mm/sec with a 60Hz  notched filter applied as appropriate. EEG data were recorded continuously and digitally stored.   Description of the recording: The background rhythms of this recording consists of a fairly well modulated medium amplitude alpha rhythm of 9 Hz that is reactive to eye opening and closure. Present in the anterior head region is a 15-20 Hz beta activity. Photic stimulation was performed, did not show any abnormalities. Hyperventilation was also performed, did not show any abnormalities. Drowsiness was manifested by background fragmentation. No abnormal epileptiform discharges seen during this recording. There was intermittent diffuse slowing. There were no electrographic seizure identified.   Abnormality: Intermittent diffuse slowing   Impression: This is an abnormal routine EEG and suggestive of mild generalized encephalopathy, nonspecific etiology.    Nussen Pullin, MD [facility]

## 2023-11-24 ENCOUNTER — Ambulatory Visit
Admission: RE | Admit: 2023-11-24 | Discharge: 2023-11-24 | Disposition: A | Discharge status: Home / Self Care | Source: Ambulatory Visit | Attending: Internal Medicine | Admitting: Internal Medicine

## 2023-11-24 ENCOUNTER — Telehealth: Payer: Self-pay

## 2023-11-24 DIAGNOSIS — F411 Generalized anxiety disorder: Secondary | ICD-10-CM

## 2023-11-24 DIAGNOSIS — G3184 Mild cognitive impairment, so stated: Secondary | ICD-10-CM

## 2023-11-24 DIAGNOSIS — D329 Benign neoplasm of meninges, unspecified: Secondary | ICD-10-CM | POA: Diagnosis not present

## 2023-11-24 MED ORDER — GADOPICLENOL 0.5 MMOL/ML IV SOLN
5.0000 mL | Freq: Once | INTRAVENOUS | Status: AC | PRN
  Administered 2023-11-24: 5 mL via INTRAVENOUS

## 2023-11-24 NOTE — Telephone Encounter (Signed)
 Talked with dtr. Pt weaned off lorazepam  today. She is asking that med list be updated and that it is current each time she has a visit or a change is made in between visit. [name] is requesting that all OTC medications be taken off. She asked that I call her back on Monday and review all other medications.

## 2023-11-24 NOTE — Telephone Encounter (Signed)
 I'm not 100% sure how much she was taking.  I think it was 1/2 of 0.5 mg daily.  If that is correct, she can just stop it.

## 2023-11-28 NOTE — Telephone Encounter (Signed)
 Reviewed med list with daughter and updated as appropriate. Took off OTC meds and meds no longer taking.

## 2023-11-28 NOTE — Telephone Encounter (Signed)
 [name] lvm today requesting a call back from the nurse to review and update medications.

## 2023-11-28 NOTE — Telephone Encounter (Signed)
 LVM to [facility]

## 2023-11-28 NOTE — Telephone Encounter (Signed)
 Daughter called back at 1:42p

## 2023-11-29 NOTE — Telephone Encounter (Signed)
 Pt's daughter has called for results, she is asking Particia, RN calls her at [phone]

## 2023-11-30 NOTE — Telephone Encounter (Signed)
-----   Message from Olam CHRISTELLA Lather sent at 11/29/2023  1:06 PM EST -----

## 2023-11-30 NOTE — Telephone Encounter (Signed)
 Pt has called for results, call sent to [place], [place]

## 2023-11-30 NOTE — Telephone Encounter (Signed)
 I spoke to pt then also to daughter, [name].   Relayed results of EEG and lab results.  (As listed per Dr. Chalice result notes.  I told her MRI brain was back, ordered by Dr. Shepard, needs to speak to him about results.  She verbalized understanding and appreciation for call.

## 2023-12-01 ENCOUNTER — Telehealth: Payer: Self-pay | Admitting: Psychiatry

## 2023-12-01 NOTE — Telephone Encounter (Signed)
 Daughter lvm requesting Dawna to return call [phone]

## 2023-12-04 ENCOUNTER — Telehealth: Payer: Self-pay | Admitting: Psychiatry

## 2023-12-04 MED ORDER — OLANZAPINE 2.5 MG PO TABS: 2.5000 mg | ORAL_TABLET | Freq: Every day | ORAL | 0 refills | Status: DC

## 2023-12-04 NOTE — Telephone Encounter (Signed)
 Yes, I thought it might be necessary.  Stop mirtazapine  and start olanzapine  2.5 mg 1 at night.  It is very effective for irritability, anxiety and sleep.  Let her know she might sleep 10-12 hours to start with until she gets used to it.  Will be calmer within the week.  Send RX for 30 days , no refills,  cancel mirtazpine refills. Lorene Macintosh, MD, DFAPA

## 2023-12-04 NOTE — Telephone Encounter (Signed)
 Rx for olanzapine  sent, mirtazapine  canceled. [name] informed of the change. Pt also informed of the change in medication. Dtr overwhelmed.

## 2023-12-04 NOTE — Telephone Encounter (Signed)
 Dtr is asking if something else can be prescribed for patient to keep her calm. She reports patient up all night reviewing her AVS/medical records and is concerned about what she is taking and how she should be taking. Pt just weaned off Bz. 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 Dtr and I reviewed med list last week and took everything off our list that is not currently prescribed.   From last note:   Option olanzapine  2.5 mg bc getting more irritable but defer.

## 2023-12-04 NOTE — Telephone Encounter (Signed)
 Pt called wanting to verify how often she is suppose to take Miralax.

## 2023-12-04 NOTE — Telephone Encounter (Signed)
 Spoke with [name]. She reports patient is concerned about how often she should take Miralax. I told her that I don't see on our med list or in Dr. Calhoun notes. She wants to know what I will tell patient. I told her she needs to call PCP. Dtr reporting patient not sleeping and she is up reviewing her AVS. She was weaned off BZ and dtr wants to know if something else can be prescribed.

## 2023-12-05 ENCOUNTER — Ambulatory Visit: Admitting: Psychiatry

## 2023-12-05 NOTE — Telephone Encounter (Signed)
 Patient is asking how often she should take Miralax. Dtr feels like she overtakes. She reports she doesn't take daily, though describes not having BM daily and description of stools would also make me think she needs to take daily. I don't see mentioned in your note, though she said you advised her of this.

## 2023-12-05 NOTE — Telephone Encounter (Signed)
 Miralax is a fiber laxative and is not harmful to take daily unless she is having diarrhea.  Daily use is healthy otherwise.

## 2023-12-06 NOTE — Telephone Encounter (Signed)
Pt advised of recommendation. 

## 2023-12-15 ENCOUNTER — Ambulatory Visit: Admitting: Psychiatry

## 2023-12-15 ENCOUNTER — Encounter: Payer: Self-pay | Admitting: Psychiatry

## 2023-12-15 DIAGNOSIS — Z79899 Other long term (current) drug therapy: Secondary | ICD-10-CM | POA: Diagnosis not present

## 2023-12-15 DIAGNOSIS — F411 Generalized anxiety disorder: Secondary | ICD-10-CM

## 2023-12-15 DIAGNOSIS — F5105 Insomnia due to other mental disorder: Secondary | ICD-10-CM | POA: Diagnosis not present

## 2023-12-15 DIAGNOSIS — R4189 Other symptoms and signs involving cognitive functions and awareness: Secondary | ICD-10-CM

## 2023-12-15 DIAGNOSIS — F3162 Bipolar disorder, current episode mixed, moderate: Secondary | ICD-10-CM | POA: Diagnosis not present

## 2023-12-15 NOTE — Progress Notes (Signed)
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 None  05/28/2024  8:45 AM Whitfield Raisin, NP [facility] None       No orders of the defined types were placed in this encounter.      -------------------------------

## 2023-12-15 NOTE — Patient Instructions (Signed)
 Stop mirtazapine  Start olanzapine  2.5 mg tablet 2-3 hours before sleep.

## 2023-12-18 ENCOUNTER — Telehealth: Payer: Self-pay | Admitting: Psychiatry

## 2023-12-18 DIAGNOSIS — N3946 Mixed incontinence: Secondary | ICD-10-CM | POA: Diagnosis not present

## 2023-12-18 DIAGNOSIS — N3020 Other chronic cystitis without hematuria: Secondary | ICD-10-CM | POA: Diagnosis not present

## 2023-12-18 NOTE — Telephone Encounter (Signed)
 Pt lvm reporting. Started Olanzapine  2.5 mg  Side effects are seeing shadows in dark and images inside. Did not scare her. Just wanted to report. [phone]

## 2023-12-20 ENCOUNTER — Telehealth: Payer: Self-pay | Admitting: Psychiatry

## 2023-12-20 NOTE — Telephone Encounter (Signed)
 Patient's daughter GG lvm at 11:25 stating that Johnika no longer wants her involved so she is removing herself. She is unsure of what to do with her car keys is she should give them back or return them. If she needs to return them she would like a letter stating that Keali shouldn't be driving. She has contacted [employer] and they advised her to get a lawyer and she doesn't want to do that. Pls rtc [phone]

## 2023-12-21 NOTE — Telephone Encounter (Signed)
 Pt said you told her to report any SE from olanzapine . She is reporting images, that are changed in character and not scary, but existed prior to starting the olanzapine . She also reports dry mouth. Neither SE is very concerning to her.

## 2023-12-21 NOTE — Telephone Encounter (Signed)
 Told [name] that Dr. Geoffry noted in last note that he agreed with Neuro that patient should not be driving. Told her to contact neurologist for letter since he was the one who made the recommendation.

## 2023-12-21 NOTE — Telephone Encounter (Signed)
 Ok noted .  Give the med more time.

## 2023-12-22 NOTE — Telephone Encounter (Signed)
 Patient lvm at 8:54 asking to meet or speak with CC. She is unsure if she is making progress or not. Pls rtc [phone] Appt 12/19

## 2023-12-25 NOTE — Telephone Encounter (Signed)
 Called pt and she doesn't remember leaving the message. She has an appt with Dr. Geoffry on 12/19.

## 2023-12-29 ENCOUNTER — Encounter: Payer: Self-pay | Admitting: Psychiatry

## 2023-12-29 ENCOUNTER — Ambulatory Visit: Admitting: Psychiatry

## 2023-12-29 DIAGNOSIS — Z79899 Other long term (current) drug therapy: Secondary | ICD-10-CM

## 2023-12-29 DIAGNOSIS — R4189 Other symptoms and signs involving cognitive functions and awareness: Secondary | ICD-10-CM

## 2023-12-29 DIAGNOSIS — F3162 Bipolar disorder, current episode mixed, moderate: Secondary | ICD-10-CM

## 2023-12-29 DIAGNOSIS — F5105 Insomnia due to other mental disorder: Secondary | ICD-10-CM | POA: Diagnosis not present

## 2023-12-29 DIAGNOSIS — F411 Generalized anxiety disorder: Secondary | ICD-10-CM | POA: Diagnosis not present

## 2023-12-29 MED ORDER — OLANZAPINE 2.5 MG PO TABS: 2.5000 mg | ORAL_TABLET | Freq: Every day | ORAL | 1 refills | Status: DC

## 2023-12-29 NOTE — Progress Notes (Signed)
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 her to risks of atypical antipsychotics which would be the alternative to lithium .  She's already failed alternative of Depakote.  Insomnia is managed with medications at [facility].  Would prefer she didn't take Benadryl bc cog risks.  Encourage continued exercise including balance and yoga.  Continue lithium  150 mg every morning and 300 mg nightly, Labs stable 6/23 including normal BMP,   Now repeat level.  Consider amiloride for frequent urination.   olanzapine  2.5 mg bc getting more irritable and more anxxious sn not sleeping, for bipolar mixed.  Is better today but not sure if that is bc being seen alone or from the  meds.  Consider increasing it, but defer until next visit  No med changes today  The risk of increasing lithium  to manage worsening mixed sx, insomnia and anxiety is too great when other options exist.  However there is not reasonable option that is not also an antipsychotic.   Best option with great effectiveness for bipolar and anxiety and insomnia and reasonably easy to dos is olanzapine .  Low risk EPS.  Discussed potential metabolic side effects associated with atypical antipsychotics, as well as potential risk for movement side effects. Advised pt to contact office if movement side effects occur.   Agree with neuro that pt should not drive bc cognitive decline.  Supportive therapy around having conflict with D over her care.    FU 4 weeks  Lorene Macintosh, MD, DFAPA   Please see After Visit Summary for patient specific instructions.  Future Appointments  Date Time Provider Department Center  01/24/2024  9:30 AM Cottle, Lorene KANDICE Raddle., MD [facility] None  05/28/2024  8:45 AM Whitfield Raisin, NP [facility] None       No orders of the defined types were placed in this encounter.      -------------------------------

## 2024-01-03 ENCOUNTER — Telehealth: Payer: Self-pay | Admitting: Neurology

## 2024-01-03 NOTE — Telephone Encounter (Signed)
 Contacted pt, LVM req call back. Office hours provided

## 2024-01-03 NOTE — Telephone Encounter (Addendum)
 Pt states she has been given results to EEG and her MRI , she is asking for a call from RN to discuss her diagnosis as a result of recent test.

## 2024-01-24 ENCOUNTER — Ambulatory Visit: Admitting: Psychiatry

## 2024-01-24 ENCOUNTER — Encounter: Payer: Self-pay | Admitting: Psychiatry

## 2024-01-24 DIAGNOSIS — F411 Generalized anxiety disorder: Secondary | ICD-10-CM | POA: Diagnosis not present

## 2024-01-24 DIAGNOSIS — R4189 Other symptoms and signs involving cognitive functions and awareness: Secondary | ICD-10-CM | POA: Diagnosis not present

## 2024-01-24 DIAGNOSIS — F3181 Bipolar II disorder: Secondary | ICD-10-CM

## 2024-01-24 DIAGNOSIS — F3162 Bipolar disorder, current episode mixed, moderate: Secondary | ICD-10-CM

## 2024-01-24 DIAGNOSIS — F5105 Insomnia due to other mental disorder: Secondary | ICD-10-CM

## 2024-01-24 DIAGNOSIS — Z79899 Other long term (current) drug therapy: Secondary | ICD-10-CM

## 2024-01-24 MED ORDER — OLANZAPINE 7.5 MG PO TABS: 3.7500 mg | ORAL_TABLET | Freq: Every day | ORAL | 1 refills | Status: AC

## 2024-01-24 MED ORDER — LITHIUM CARBONATE 150 MG PO CAPS: ORAL_CAPSULE | ORAL | 2 refills | Status: AC

## 2024-01-24 NOTE — Progress Notes (Signed)
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  affecting thyroid  and renal functon.  Discussed need for periodic lab monitoring to determine drug level and to assess for potential adverse effects.  Counseled patient regarding signs and symptoms of lithium  toxicity and advised that they notify office immediately or seek urgent medical attention if experiencing these signs and symptoms.  Patient advised to contact office with any questions or concerns.  She has a history of hyperparathyroidism and is aware that lithium  can increase calcium levels.  At her age it would be very risky to attempt to find an alternative mood stabilizer when lithium  has been effective.  Alternatives would expose her to different types of side effects such as  those with the atypical antipsychotics including tardive dyskinesia. Helps mood swings and reactivity but not perfectly. recheck lithium  level.  Pending. 05/08/23 lithium  0.7 on 450 mg daily. Nl TSH, Cr 0.72 , hi PTH 149, Amy [name] 42.4.  She has been on lithium  for many years with good response.  Specifically stopping lithium  which has helped her for decades would be highly risky for significant mood destabilization which  could last for months and expose her to risks of atypical antipsychotics which would be the alternative to lithium .  She's already failed alternative of Depakote.  Insomnia is managed with medications at [facility].  Would prefer she didn't take Benadryl bc cog risks.  Encourage continued exercise including balance and yoga.  Continue lithium  150 mg every morning and 300 mg nightly,  Labs stable 6/23 including normal BMP,   Now repeat level.  Consider amiloride for frequent urination.  Increase olanzapine  3.75 mg HS per her request for residual anxiety and more anxxious re: not sleeping, for bipolar mixed.  Is better today than before olanzapine  with much less irritability.  But still complaining of anxiety and sleep not normal.  Need to determine if we are at the optimal dose.  The risk of increasing lithium  to manage worsening mixed sx, insomnia and anxiety is too great when other options exist.  However there is not reasonable option that is not also an antipsychotic.  Best option with great effectiveness for bipolar and anxiety and insomnia and reasonably easy to dos is olanzapine .  Low risk EPS.  Discussed potential metabolic side effects associated with atypical antipsychotics, as well as potential risk for movement side effects. Advised pt to contact office if movement side effects occur.  Disc risk of sedation and if it is a problem let us  know and we will reduce.  Disc plan with pt and son-in-law and they agree with plan.  FU 4 weeks  Lorene Macintosh, MD, DFAPA   Please see After Visit Summary for patient specific instructions.  Future Appointments  Date Time Provider Department Center  02/19/2024  2:30 PM Cottle, Lorene KANDICE Raddle., MD [facility] None  05/28/2024  8:45 AM Whitfield Raisin, NP [facility] None       No orders of the defined types were placed in this encounter.      -------------------------------

## 2024-02-09 LAB — BASIC METABOLIC PANEL WITH GFR: EGFR: 96.7

## 2024-02-19 ENCOUNTER — Ambulatory Visit: Admitting: Psychiatry

## 2024-05-28 ENCOUNTER — Ambulatory Visit: Admitting: Adult Health
